# Patient Record
Sex: Female | Born: 1945 | Race: White | Hispanic: No | Marital: Married | Smoking: Never smoker
Health system: Southern US, Community
[De-identification: ages and names within clinical notes are randomized; demographics above are authoritative.]

## PROBLEM LIST (undated history)

## (undated) DIAGNOSIS — I10 Essential (primary) hypertension: Secondary | ICD-10-CM

## (undated) DIAGNOSIS — E039 Hypothyroidism, unspecified: Secondary | ICD-10-CM

## (undated) DIAGNOSIS — E785 Hyperlipidemia, unspecified: Secondary | ICD-10-CM

## (undated) DIAGNOSIS — K219 Gastro-esophageal reflux disease without esophagitis: Secondary | ICD-10-CM

## (undated) DIAGNOSIS — Z9889 Other specified postprocedural states: Secondary | ICD-10-CM

## (undated) DIAGNOSIS — I447 Left bundle-branch block, unspecified: Secondary | ICD-10-CM

## (undated) DIAGNOSIS — I951 Orthostatic hypotension: Secondary | ICD-10-CM

## (undated) DIAGNOSIS — G459 Transient cerebral ischemic attack, unspecified: Secondary | ICD-10-CM

## (undated) DIAGNOSIS — M1990 Unspecified osteoarthritis, unspecified site: Secondary | ICD-10-CM

## (undated) DIAGNOSIS — I428 Other cardiomyopathies: Secondary | ICD-10-CM

## (undated) DIAGNOSIS — Z79899 Other long term (current) drug therapy: Secondary | ICD-10-CM

## (undated) HISTORY — PX: ABDOMINAL HYSTERECTOMY: SHX81

## (undated) HISTORY — DX: Other cardiomyopathies: I42.8

## (undated) HISTORY — PX: WRIST FUSION: SHX839

## (undated) HISTORY — PX: NECK SURGERY: SHX720

## (undated) HISTORY — PX: PARTIAL HYSTERECTOMY: SHX80

## (undated) HISTORY — DX: Other long term (current) drug therapy: Z79.899

---

## 2001-01-05 ENCOUNTER — Encounter: Payer: Self-pay | Admitting: Family Medicine

## 2001-01-05 ENCOUNTER — Ambulatory Visit (HOSPITAL_COMMUNITY): Admission: RE | Admit: 2001-01-05 | Discharge: 2001-01-05 | Payer: Self-pay | Admitting: Family Medicine

## 2001-01-06 ENCOUNTER — Encounter: Payer: Self-pay | Admitting: Family Medicine

## 2001-01-06 ENCOUNTER — Ambulatory Visit (HOSPITAL_COMMUNITY): Admission: RE | Admit: 2001-01-06 | Discharge: 2001-01-06 | Payer: Self-pay | Admitting: Family Medicine

## 2001-01-11 ENCOUNTER — Ambulatory Visit (HOSPITAL_COMMUNITY): Admission: RE | Admit: 2001-01-11 | Discharge: 2001-01-11 | Payer: Self-pay | Admitting: Family Medicine

## 2001-01-11 ENCOUNTER — Encounter: Payer: Self-pay | Admitting: Family Medicine

## 2001-02-22 ENCOUNTER — Encounter: Payer: Self-pay | Admitting: Family Medicine

## 2001-02-22 ENCOUNTER — Ambulatory Visit (HOSPITAL_COMMUNITY): Admission: RE | Admit: 2001-02-22 | Discharge: 2001-02-22 | Payer: Self-pay | Admitting: Family Medicine

## 2001-04-28 ENCOUNTER — Other Ambulatory Visit: Admission: RE | Admit: 2001-04-28 | Discharge: 2001-04-28 | Payer: Self-pay | Admitting: Obstetrics and Gynecology

## 2001-04-30 ENCOUNTER — Encounter: Payer: Self-pay | Admitting: Obstetrics and Gynecology

## 2001-04-30 ENCOUNTER — Ambulatory Visit (HOSPITAL_COMMUNITY): Admission: RE | Admit: 2001-04-30 | Discharge: 2001-04-30 | Payer: Self-pay | Admitting: Obstetrics and Gynecology

## 2001-06-28 ENCOUNTER — Encounter: Payer: Self-pay | Admitting: Family Medicine

## 2001-06-28 ENCOUNTER — Ambulatory Visit (HOSPITAL_COMMUNITY): Admission: RE | Admit: 2001-06-28 | Discharge: 2001-06-28 | Payer: Self-pay | Admitting: Family Medicine

## 2002-09-05 ENCOUNTER — Encounter: Payer: Self-pay | Admitting: Family Medicine

## 2002-09-05 ENCOUNTER — Ambulatory Visit (HOSPITAL_COMMUNITY): Admission: RE | Admit: 2002-09-05 | Discharge: 2002-09-05 | Payer: Self-pay | Admitting: Family Medicine

## 2002-10-26 ENCOUNTER — Encounter: Payer: Self-pay | Admitting: Family Medicine

## 2002-10-26 ENCOUNTER — Ambulatory Visit (HOSPITAL_COMMUNITY): Admission: RE | Admit: 2002-10-26 | Discharge: 2002-10-26 | Payer: Self-pay | Admitting: Family Medicine

## 2002-10-27 ENCOUNTER — Ambulatory Visit (HOSPITAL_COMMUNITY): Admission: RE | Admit: 2002-10-27 | Discharge: 2002-10-27 | Payer: Self-pay | Admitting: Family Medicine

## 2002-10-27 ENCOUNTER — Encounter: Payer: Self-pay | Admitting: Family Medicine

## 2002-11-25 ENCOUNTER — Ambulatory Visit (HOSPITAL_COMMUNITY): Admission: RE | Admit: 2002-11-25 | Discharge: 2002-11-25 | Payer: Self-pay | Admitting: Pulmonary Disease

## 2004-08-26 ENCOUNTER — Ambulatory Visit: Payer: Self-pay | Admitting: Orthopedic Surgery

## 2004-10-21 ENCOUNTER — Ambulatory Visit: Payer: Self-pay | Admitting: Orthopedic Surgery

## 2004-10-21 ENCOUNTER — Ambulatory Visit (HOSPITAL_COMMUNITY): Admission: RE | Admit: 2004-10-21 | Discharge: 2004-10-21 | Payer: Self-pay | Admitting: Obstetrics and Gynecology

## 2004-11-04 ENCOUNTER — Ambulatory Visit: Payer: Self-pay | Admitting: Orthopedic Surgery

## 2004-11-11 ENCOUNTER — Ambulatory Visit (HOSPITAL_COMMUNITY): Admission: RE | Admit: 2004-11-11 | Discharge: 2004-11-11 | Payer: Self-pay | Admitting: Orthopedic Surgery

## 2004-11-18 ENCOUNTER — Ambulatory Visit: Payer: Self-pay | Admitting: Orthopedic Surgery

## 2005-06-01 ENCOUNTER — Emergency Department (HOSPITAL_COMMUNITY): Admission: EM | Admit: 2005-06-01 | Discharge: 2005-06-02 | Payer: Self-pay | Admitting: Emergency Medicine

## 2005-08-18 ENCOUNTER — Ambulatory Visit: Payer: Self-pay | Admitting: Orthopedic Surgery

## 2005-09-29 ENCOUNTER — Ambulatory Visit: Payer: Self-pay | Admitting: Orthopedic Surgery

## 2005-10-03 ENCOUNTER — Ambulatory Visit (HOSPITAL_COMMUNITY): Admission: RE | Admit: 2005-10-03 | Discharge: 2005-10-03 | Payer: Self-pay | Admitting: Orthopedic Surgery

## 2005-10-20 ENCOUNTER — Ambulatory Visit: Payer: Self-pay | Admitting: Orthopedic Surgery

## 2006-01-13 ENCOUNTER — Encounter (HOSPITAL_COMMUNITY): Admission: RE | Admit: 2006-01-13 | Discharge: 2006-01-13 | Payer: Self-pay | Admitting: Orthopedic Surgery

## 2006-01-15 ENCOUNTER — Ambulatory Visit: Admission: RE | Admit: 2006-01-15 | Discharge: 2006-01-15 | Payer: Self-pay | Admitting: Orthopedic Surgery

## 2006-01-22 ENCOUNTER — Encounter (HOSPITAL_COMMUNITY): Admission: RE | Admit: 2006-01-22 | Discharge: 2006-02-21 | Payer: Self-pay | Admitting: Orthopedic Surgery

## 2007-08-18 ENCOUNTER — Ambulatory Visit (HOSPITAL_COMMUNITY): Admission: RE | Admit: 2007-08-18 | Discharge: 2007-08-18 | Payer: Self-pay | Admitting: Obstetrics and Gynecology

## 2008-10-01 ENCOUNTER — Inpatient Hospital Stay (HOSPITAL_COMMUNITY): Admission: EM | Admit: 2008-10-01 | Discharge: 2008-10-03 | Payer: Self-pay | Admitting: Emergency Medicine

## 2008-10-02 ENCOUNTER — Encounter (INDEPENDENT_AMBULATORY_CARE_PROVIDER_SITE_OTHER): Payer: Self-pay | Admitting: Internal Medicine

## 2009-04-30 ENCOUNTER — Ambulatory Visit (HOSPITAL_COMMUNITY): Admission: RE | Admit: 2009-04-30 | Discharge: 2009-05-01 | Payer: Self-pay | Admitting: Neurosurgery

## 2009-05-15 ENCOUNTER — Emergency Department (HOSPITAL_COMMUNITY): Admission: EM | Admit: 2009-05-15 | Discharge: 2009-05-15 | Payer: Self-pay | Admitting: Emergency Medicine

## 2009-07-26 ENCOUNTER — Ambulatory Visit (HOSPITAL_COMMUNITY): Admission: RE | Admit: 2009-07-26 | Discharge: 2009-07-26 | Payer: Self-pay | Admitting: Cardiovascular Disease

## 2010-01-24 ENCOUNTER — Ambulatory Visit: Payer: Self-pay | Admitting: Orthopedic Surgery

## 2010-01-24 DIAGNOSIS — M19049 Primary osteoarthritis, unspecified hand: Secondary | ICD-10-CM | POA: Insufficient documentation

## 2010-01-31 ENCOUNTER — Encounter: Payer: Self-pay | Admitting: Orthopedic Surgery

## 2010-05-08 ENCOUNTER — Ambulatory Visit
Admission: RE | Admit: 2010-05-08 | Discharge: 2010-05-08 | Payer: Self-pay | Source: Home / Self Care | Attending: Cardiology | Admitting: Cardiology

## 2010-05-08 ENCOUNTER — Encounter (INDEPENDENT_AMBULATORY_CARE_PROVIDER_SITE_OTHER): Payer: Self-pay | Admitting: *Deleted

## 2010-05-08 ENCOUNTER — Encounter: Payer: Self-pay | Admitting: Physician Assistant

## 2010-05-08 DIAGNOSIS — E785 Hyperlipidemia, unspecified: Secondary | ICD-10-CM | POA: Insufficient documentation

## 2010-05-08 DIAGNOSIS — I10 Essential (primary) hypertension: Secondary | ICD-10-CM | POA: Insufficient documentation

## 2010-05-09 ENCOUNTER — Encounter: Payer: Self-pay | Admitting: Cardiology

## 2010-05-12 ENCOUNTER — Encounter: Payer: Self-pay | Admitting: Obstetrics and Gynecology

## 2010-05-23 NOTE — Progress Notes (Signed)
Summary: Progress note  Progress note   Imported By: Jacklynn Ganong 01/22/2010 10:03:49  _____________________________________________________________________  External Attachment:    Type:   Image     Comment:   External Document

## 2010-05-23 NOTE — Letter (Signed)
Summary: [facility] [place] [facility] [employer], agricultural at [employer]  [address] [place] [address]   Phone: [phone]  Fax: [phone]     May 08, 2010 MRN: [id number]   [facility] [address] [place], [place]  [address]      YOUR LAB WORK IS DUE   May 29, 2010  Please go to [facility], located across the street from [facility] on the second floor.  Hours are Monday - Friday 7am until 7:30pm         Saturday 8am until 12noon      _X_ YOUR LABWORK IS NOT FASTING --YOU MAY EAT PRIOR TO LABWORK

## 2010-05-23 NOTE — Assessment & Plan Note (Signed)
Summary: EVAL RT MIDDLE FINGER/"KNOT"/NEEDS XRAY/BCBS OUTOFST/CAF   Vital Signs:  Patient profile:   65 year old female Height:      63 inches Weight:      153 pounds Pulse rate:   64 / minute Resp:     16 per minute  Vitals Entered By: Laura Canada MD (January 24, 2010 9:04 AM)  Visit Type:  new patient Referring [name]:  self Primary [name]:  Dr. Phillips [name]  CC:  knot on finger.  History of Present Illness: I saw [name] in the office today for an initial visit.  She is a 65 years old woman with the complaint of:  knot on the right middle finger.  Xrays today.  Meds: Simvastatin, Synthroid, Fludrocortisone, Citracel, Centrum silver.  FYI 2007 was seen here for mass right wrist, was referred to [facility], had surgery.  exam the patient has a mass on the RIGHT long finger which appears to be associated with some sharp moderate pain which comes and goes.  She denies any change in size in the mass.  She gets relief from Aleve.  She denies any numbness or neurologic symptoms but has some pain when she bends her finger      Allergies (verified): 1)  ! Codeine  Past History:  Past Medical History: cholesterol thyroid  Past Surgical History: rt wrist [name], mass c spine surgery Dr. Newell [name]. catheterization  Family History: FH of Cancer:  Family History of Diabetes  Social History: Patient is married.  unemployed no smoking no alcohol 1 cup of coffee per day. GED  Review of Systems Constitutional:  Complains of fatigue; denies weight loss, weight gain, fever, and chills. Cardiovascular:  Denies chest pain, palpitations, fainting, and murmurs. Respiratory:  Complains of snoring; denies short of breath, wheezing, couch, tightness, pain on inspiration, and snoring . Gastrointestinal:  Denies heartburn, nausea, vomiting, diarrhea, constipation, and blood in your stools. Genitourinary:  Denies frequency, urgency, difficulty urinating, painful urination,  flank pain, and bleeding in urine. Neurologic:  Denies numbness, tingling, unsteady gait, dizziness, tremors, and seizure. Musculoskeletal:  Complains of muscle pain; denies joint pain, swelling, instability, stiffness, redness, and heat. Endocrine:  Complains of excessive thirst; denies exessive urination and heat or cold intolerance. Psychiatric:  Denies nervousness, depression, anxiety, and hallucinations. Skin:  Complains of poor healing; denies changes in the skin, rash, itching, and redness. HEENT:  Denies blurred or double vision, eye pain, redness, and watering. Immunology:  Denies seasonal allergies, sinus problems, and allergic to bee stings. Hemoatologic:  Complains of brusing; denies easy bleeding.  Physical Exam  Additional Exam:  GEN: well developed, well nourished, normal grooming and hygiene, no deformity and normal body habitus.   CDV: RIGHT upper extremity: radial and ulnar pulses are normal, no edema, no erythema. no tenderness  Lymph:RIGHT upper extremity.  normal lymph nodes   Skin: no rashes, skin lesions or open sores   NEURO: normal coordination, reflexes, sensation.   Psyche: awake, alert and oriented. Mood normal   RIGHT long finger has a nodule over the PIP joint which is consistent with an arthritic nodule and possible mucous cyst.  It is nontender.  The range of motion in the joint is normal.  She has excellent extension power and flexion power and the joint is stable   Impression & Recommendations:  Problem # 1:  OSTEOARTHROSIS UNSPEC WHETHER GEN/LOCALIZED HAND (ICD-715.94) Assessment New  x-rays of the hand show degenerative changes about the PIP joint otherwise remaining portions of the hand  with normal impression normal and  Orders: New Patient Level III ([address]) Hand x-ray, minimum 3 views ([address])  Patient Instructions: 1)  Aspercremes three times a day to hand as needed

## 2010-05-23 NOTE — Assessment & Plan Note (Signed)
Summary: ***NP6 HYPERTENTION   Visit Type:  Initial Consult Referring Provider:  self Primary Provider:  Dr. Phillips Odor  CC:  hypertention per Dr.Golding.  History of Present Illness: This is a 65 year old white female patient who was referred to [place] for control of her hypertension. She has had 2 cardiac catheterizations in the past which showed normal coronary arteries and normal LV function. This was last performed by [facility] in April of 2011 after an abnormal Myoview. The patient has been treated with losartan hydrochlorothiazide 100 mg/12.5 mg daily. She recently had dizziness and low blood pressures with a systolic of 90. Her medications was stopped last Friday her blood pressures began climbing up to 158/80 168/82 and today 149/69. 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 She was walking about 2 miles daily but backed off on her blood pressure became a problem.  Patient denies chest pain, palpitations, dyspnea, dyspnea on exertion, dizziness or presyncope.  Current Medications (verified): 1)  Synthroid 75 Mcg Tabs (Levothyroxine Sodium) .... Take 1 Tab Daily 2)  Vivelle-Dot 0.05 Mg/24hr Pttw (Estradiol) .... Apply 1 Patch Twice Weekly 3)  Aleve 220 Mg Tabs (Naproxen Sodium) .... Take As Directed 4)  Caltrate 600+d Plus 600-400 Mg-Unit Tabs (Calcium Carbonate-Vit D-Min) .... Take 1 Tab Daily 5)  Simvastatin 20 Mg Tabs (Simvastatin) .... Take 1 Tab Daily 6)  Synthroid 75 Mcg Tabs (Levothyroxine Sodium) .... Take 1 Tab Daily 7)  Hydrochlorothiazide 25 Mg Tabs (Hydrochlorothiazide) .... Take One Tablet By Mouth Daily.  Allergies (verified): 1)  ! Codeine  Comments:  Nurse/Medical Assistant: patient is a [facility] reason she is coming here  is she told Dr.Golding she didnt want to go back there. [facility]  Past History:  Past Medical  History: Last updated: 01/24/2010 cholesterol thyroid  Past Surgical History: Last updated: 01/24/2010 rt wrist Gramig, mass c spine surgery Dr. Newell Coral. catheterization  Family History: Last updated: 01/24/2010 FH of Cancer:  Family History of Diabetes  Social History: Last updated: 01/24/2010 Patient is married.  unemployed no smoking no alcohol 1 cup of coffee per day. GED  Review of Systems       see history of present illness, otherwise negative  Vital Signs:  Patient profile:   65 year old female Weight:      154 pounds BMI:     27.38 Pulse rate:   76 / minute BP sitting:   146 / 69  (left arm)  Vitals Entered By: Dreama Saa, CNA (May 08, 2010 10:47 AM)  Physical Exam  General:   Well-nournished, in no acute distress. Neck: No JVD, HJR, Bruit, or thyroid enlargement Lungs: No tachypnea, clear without wheezing, rales, or rhonchi Cardiovascular: RRR, PMI not displaced, heart sounds normal, no murmurs, gallops, bruit, thrill, or heave. Abdomen: BS normal. Soft without organomegaly, masses, lesions or tenderness. Extremities: mild ankle edema, without cyanosis, clubbing. Good distal pulses bilateral SKin: Warm, no lesions or rashes  Musculoskeletal: No deformities Neuro: no focal signs    EKG  Procedure date:  05/08/2010  Findings:      normal sinus rhythm, poor R wave progression consistent with an anteroseptal infarct and nonspecific ST-T wave changes inferolateral no acute change  Impression & Recommendations:  Problem # 1:  HYPERTENSION, BENIGN (ICD-401.1)  Patient's blood pressure is slowly climbing since her medications have been stopped. She does like to have  a diuretic because of ankle swelling. We will restart hydrochlorothiazide 25 mg once daily. She will continue to keep track of her blood pressure the home and call if it becomes too low or continues to be elevated. I will see her back in one month. We will also give her 2 g sodium  diet to follow.We will check the BMET in 3 weeks  Her updated medication list for this problem includes:    Hydrochlorothiazide 25 Mg Tabs (Hydrochlorothiazide) .Marland Kitchen... Take one tablet by mouth daily.  Problem # 2:  HYPERLIPIDEMIA-MIXED (ICD-272.4)  Patient's cholesterol was checked 2 weeks ago and is managed by her primary care physician Her updated medication list for this problem includes:    Simvastatin 20 Mg Tabs (Simvastatin) .Marland Kitchen... Take 1 tab daily  Her updated medication list for this problem includes:    Simvastatin 20 Mg Tabs (Simvastatin) .Marland Kitchen... Take 1 tab daily  Other Orders: Future Orders: T-Basic Metabolic Panel [phone]) ... 05/29/2010  Patient Instructions: 1)  Your physician recommends that you schedule a follow-up appointment in: 1 MONTH 2)  Your physician recommends that you return for lab work in: 3 WEEKS 3)  Your physician has recommended you make the following change in your medication: HYDROCHLORATHIAZIDE 25 MG DAILY Prescriptions: HYDROCHLOROTHIAZIDE 25 MG TABS (HYDROCHLOROTHIAZIDE) Take one tablet by mouth daily.  #30 x 3   Entered by:   Teressa Lower RN   Authorized by:   Marletta Lor, PA-C   Signed by:   Teressa Lower RN on 05/08/2010   Method used:   Electronically to        [facility] (retail)       [address]       [place], [place]  [address]       Ph: [phone]       Fax: [phone]   RxID:   [id number]   Appended Document: ***NP6 HYPERTENTION Patient seen and examined with Ms. Geni Bers. History was reviewed, including cardiac evaluation to date and also recent medication adjustments. Blood pressure trend is now increasing off antihypertensives. Patient states that her weight has increased over the last few years, and she has been less active in terms of her walking regimen. We discussed sodium restriction, weight loss with more regular exercise, and plan to resume HCTZ at 25 mg daily alone at  this point. Follow blood pressure trend from there and modified as necessary. Followup BMET for return visit in one month.

## 2010-05-23 NOTE — Letter (Signed)
Summary: [facility]   Imported By: Faythe Ghee 05/09/2010 11:04:18  _____________________________________________________________________  External Attachment:    Type:   Image     Comment:   External Document

## 2010-05-23 NOTE — Letter (Signed)
Summary: History form  History form   Imported By: Jacklynn Ganong 01/31/2010 15:47:08  _____________________________________________________________________  External Attachment:    Type:   Image     Comment:   External Document

## 2010-05-23 NOTE — Progress Notes (Signed)
Summary: initial evaluation  initial evaluation   Imported By: Jacklynn Ganong 01/22/2010 10:03:03  _____________________________________________________________________  External Attachment:    Type:   Image     Comment:   External Document

## 2010-05-27 ENCOUNTER — Other Ambulatory Visit: Payer: Self-pay | Admitting: Neurosurgery

## 2010-05-27 DIAGNOSIS — M542 Cervicalgia: Secondary | ICD-10-CM

## 2010-05-29 ENCOUNTER — Encounter: Payer: Self-pay | Admitting: Physician Assistant

## 2010-05-29 LAB — CONVERTED CEMR LAB: Sodium: 137 meq/L (ref 135–145)

## 2010-06-05 ENCOUNTER — Ambulatory Visit
Admission: RE | Admit: 2010-06-05 | Discharge: 2010-06-05 | Disposition: A | Discharge status: Home / Self Care | Payer: BC Managed Care – PPO | Source: Ambulatory Visit | Attending: Neurosurgery | Admitting: Neurosurgery

## 2010-06-05 DIAGNOSIS — M542 Cervicalgia: Secondary | ICD-10-CM

## 2010-06-17 ENCOUNTER — Other Ambulatory Visit: Payer: Self-pay | Admitting: Obstetrics and Gynecology

## 2010-06-17 DIAGNOSIS — Z139 Encounter for screening, unspecified: Secondary | ICD-10-CM

## 2010-06-19 ENCOUNTER — Ambulatory Visit: Payer: Self-pay | Admitting: Physician Assistant

## 2010-07-03 ENCOUNTER — Encounter: Payer: Self-pay | Admitting: Physician Assistant

## 2010-07-03 ENCOUNTER — Ambulatory Visit (INDEPENDENT_AMBULATORY_CARE_PROVIDER_SITE_OTHER): Payer: BC Managed Care – PPO | Admitting: Physician Assistant

## 2010-07-03 DIAGNOSIS — I10 Essential (primary) hypertension: Secondary | ICD-10-CM

## 2010-07-03 DIAGNOSIS — E785 Hyperlipidemia, unspecified: Secondary | ICD-10-CM

## 2010-07-03 DIAGNOSIS — E782 Mixed hyperlipidemia: Secondary | ICD-10-CM

## 2010-07-03 NOTE — Assessment & Plan Note (Signed)
Patient's blood pressure is well controlled no change.

## 2010-07-03 NOTE — Assessment & Plan Note (Signed)
stable °

## 2010-07-03 NOTE — Progress Notes (Deleted)
Subjective:      Patient ID: [name] is a 65 y.o. female.  Chief Complaint: HPI {Common ambulatory SmartLinks:19316} ROS    Objective:    Physical Exam  Lab Review:  {Recent labs:19471::"not applicable"}    Assessment:     No diagnosis found.   Plan:     ***

## 2010-07-03 NOTE — Progress Notes (Signed)
HPI  Allergies  Allergen Reactions  . Codeine     No current outpatient prescriptions on file prior to visit.    No past medical history on file.  No past surgical history on file.  No family history on file.  History   Social History  . Marital Status: Married    Spouse Name: N/A    Number of Children: N/A  . Years of Education: N/A   Occupational History  . Not on file.   Social History Main Topics  . Smoking status: Not on file  . Smokeless tobacco: Not on file  . Alcohol Use: Not on file  . Drug Use: Not on file  . Sexually Active: Not on file   Other Topics Concern  . Not on file   Social History Narrative  . No narrative on file    ROS The patient denies anorexia, fever, weight loss,, vision loss, decreased hearing, hoarseness, chest pain, syncope, dyspnea on exertion, peripheral edema, balance deficits, hemoptysis, abdominal pain, melena, hematochezia, severe indigestion/heartburn, hematuria, incontinence, genital sores, muscle weakness, suspicious skin lesions, transient blindness, difficulty walking, depression, unusual weight change, abnormal bleeding, enlarged lymph nodes, angioedema, and breast masses.   PHYSICAL EXAM Well-nournished, in no acute distress. Neck: No JVD, HJR, Bruit, or thyroid enlargement Lungs: No tachypnea, clear without wheezing, rales, or rhonchi Cardiovascular: RRR, PMI not displaced, heart sounds normal, no murmurs, gallops, bruit, thrill, or heave. Abdomen: BS normal. Soft without organomegaly, masses, lesions or tenderness. Extremities: without cyanosis, clubbing or edema. Good distal pulses bilateral SKin: Warm, no lesions or rashes  Musculoskeletal: No deformities Neuro: no focal signs  There were no vitals taken for this visit.  EKG:  ASSESSMENT AND PLAN:

## 2010-07-04 ENCOUNTER — Ambulatory Visit (HOSPITAL_COMMUNITY)
Admission: RE | Admit: 2010-07-04 | Discharge: 2010-07-04 | Disposition: A | Discharge status: Home / Self Care | Payer: BC Managed Care – PPO | Source: Ambulatory Visit | Attending: Obstetrics and Gynecology | Admitting: Obstetrics and Gynecology

## 2010-07-04 DIAGNOSIS — Z139 Encounter for screening, unspecified: Secondary | ICD-10-CM

## 2010-07-04 DIAGNOSIS — Z1231 Encounter for screening mammogram for malignant neoplasm of breast: Secondary | ICD-10-CM | POA: Insufficient documentation

## 2010-07-07 LAB — DIFFERENTIAL
Eosinophils Absolute: 0.1 10*3/uL (ref 0.0–0.7)
Eosinophils Relative: 1 % (ref 0–5)
Lymphocytes Relative: 37 % (ref 12–46)
Monocytes Relative: 12 % (ref 3–12)
Neutro Abs: 2.1 10*3/uL (ref 1.7–7.7)
Neutrophils Relative %: 49 % (ref 43–77)

## 2010-07-07 LAB — URINE MICROSCOPIC-ADD ON

## 2010-07-07 LAB — CBC
HCT: 37.3 % (ref 36.0–46.0)
Hemoglobin: 13.2 g/dL (ref 12.0–15.0)
MCHC: 35.3 g/dL (ref 30.0–36.0)
MCV: 91.6 fL (ref 78.0–100.0)
Platelets: 186 10*3/uL (ref 150–400)
RBC: 4.07 MIL/uL (ref 3.87–5.11)
RBC: 3.99 MIL/uL (ref 3.87–5.11)
RDW: 12.8 % (ref 11.5–15.5)
WBC: 5.7 10*3/uL (ref 4.0–10.5)

## 2010-07-07 LAB — BASIC METABOLIC PANEL
BUN: 14 mg/dL (ref 6–23)
CO2: 25 mEq/L (ref 19–32)
CO2: 25 mEq/L (ref 19–32)
Calcium: 9.6 mg/dL (ref 8.4–10.5)
Chloride: 102 mEq/L (ref 96–112)
Chloride: 105 mEq/L (ref 96–112)
Creatinine, Ser: 0.89 mg/dL (ref 0.4–1.2)
GFR calc Af Amer: >60 mL/min (ref >60)
GFR calc Af Amer: >60 mL/min (ref >60)
GFR calc non Af Amer: >60 mL/min (ref >60)
Glucose, Bld: 95 mg/dL (ref 70–99)
Glucose, Bld: 122 mg/dL — ABNORMAL HIGH (ref 70–99)
Potassium: 4.2 mEq/L (ref 3.5–5.1)
Sodium: 137 mEq/L (ref 135–145)

## 2010-07-07 LAB — URINALYSIS, ROUTINE W REFLEX MICROSCOPIC
Ketones, ur: NEGATIVE mg/dL
Specific Gravity, Urine: 1.01 (ref 1.005–1.030)
pH: 8.5 — ABNORMAL HIGH (ref 5.0–8.0)

## 2010-07-09 NOTE — Assessment & Plan Note (Signed)
Summary: 1 MTH F/U PER CHECKOUT ON 05/08/10 TG/LA   Visit Type:  Follow-up Referring Provider:  self Primary Provider:  Dr. Phillips Odor  CC:  1 mth fu.  History of Present Illness: This is a 65 year old white female patient who is here today for followup of hypertension. She was started on hydrochlorothiazide her last office visit for trending hypertension. She had previously been treated with losartan hydrochlorothiazide and became hypotensive.  The patient states that her blood pressures have been stable at home and she has not had much dizziness. If she gets up quickly she'll occasionally become dizzy and when she checked her blood pressure though it has been is 97/50. She's been under quite a bit of stress because her husband had another stroke last week.  Patient is also limiting her sodium intake which has helped her swelling.  Current Medications (verified): 1)  Synthroid 75 Mcg Tabs (Levothyroxine Sodium) .... Take 1 Tab Daily 2)  Vivelle-Dot 0.05 Mg/24hr Pttw (Estradiol) .... Apply 1 Patch Twice Weekly 3)  Aleve 220 Mg Tabs (Naproxen Sodium) .... Take As Directed 4)  Caltrate 600+d Plus 600-400 Mg-Unit Tabs (Calcium Carbonate-Vit D-Min) .... Take 1 Tab Daily 5)  Simvastatin 10 Mg Tabs (Simvastatin) .... Take 1 Tab Daily 6)  Synthroid 75 Mcg Tabs (Levothyroxine Sodium) .... Take 1 Tab Daily 7)  Hydrochlorothiazide 25 Mg Tabs (Hydrochlorothiazide) .... Take One Tablet By Mouth Daily.  Allergies (verified): 1)  ! 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 Neck: No JVD, HJR, Bruit, or thyroid enlargement Lungs: No tachypnea, clear without wheezing, rales, or rhonchi Cardiovascular: RRR, PMI not displaced, heart sounds normal, no murmurs, gallops, bruit, thrill, or heave. Abdomen: BS normal. Soft without organomegaly, masses, lesions or tenderness. Extremities: without cyanosis, clubbing or edema. Good distal pulses bilateral SKin: Warm, no lesions or rashes  Musculoskeletal: No deformities Neuro: no focal signs    Impression & Recommendations:  Problem # 1:  HYPERTENSION, BENIGN (ICD-401.1) Patient's blood pressure is well controlled on hydrochlorothiazide alone. We will continue this. She can followup with her primary medical doctor and followup with cardiology as needed. BMET from May 29, 2010 was normal. Her updated medication list for this problem includes:    Hydrochlorothiazide 25 Mg Tabs (Hydrochlorothiazide) .Marland Kitchen... Take one tablet by mouth daily.  Problem # 2:  HYPERLIPIDEMIA-MIXED (ICD-272.4) treated. Her updated medication list for this problem includes:    Simvastatin 10 Mg Tabs (Simvastatin) .Marland Kitchen... Take 1 tab daily  Patient Instructions: 1)  Your physician recommends that you schedule a follow-up appointment in: as needed  Appended Document: 1 MTH F/U PER CHECKOUT ON 05/08/10 TG/LA  Reviewed Juanito Doom, MD

## 2010-07-29 LAB — VITAMIN B12: Vitamin B-12: 959 pg/mL — ABNORMAL HIGH (ref 211–911)

## 2010-07-29 LAB — BASIC METABOLIC PANEL
BUN: 10 mg/dL (ref 6–23)
BUN: 13 mg/dL (ref 6–23)
CO2: 29 mEq/L (ref 19–32)
Calcium: 9 mg/dL (ref 8.4–10.5)
Chloride: 109 mEq/L (ref 96–112)
Creatinine, Ser: 0.77 mg/dL (ref 0.4–1.2)
GFR calc non Af Amer: >60 mL/min (ref >60)
Glucose, Bld: 92 mg/dL (ref 70–99)
Potassium: 3.6 mEq/L (ref 3.5–5.1)
Potassium: 4.5 mEq/L (ref 3.5–5.1)
Sodium: 139 mEq/L (ref 135–145)

## 2010-07-29 LAB — POCT CARDIAC MARKERS
CKMB, poc: <1 ng/mL — ABNORMAL LOW (ref 1.0–8.0)
Myoglobin, poc: 49.4 ng/mL (ref 12–200)

## 2010-07-29 LAB — PROTIME-INR
INR: 1 (ref 0.00–1.49)
Prothrombin Time: 13 seconds (ref 11.6–15.2)

## 2010-07-29 LAB — HOMOCYSTEINE: Homocysteine: 9.5 umol/L (ref 4.0–15.4)

## 2010-07-29 LAB — FOLATE RBC: RBC Folate: 1255 ng/mL — ABNORMAL HIGH (ref 180–600)

## 2010-07-29 LAB — LIPID PANEL
LDL Cholesterol: 135 mg/dL — ABNORMAL HIGH (ref 0–99)
Total CHOL/HDL Ratio: 4.2 RATIO
VLDL: 27 mg/dL (ref 0–40)

## 2010-07-29 LAB — HEPATIC FUNCTION PANEL
ALT: 16 U/L (ref 0–35)
Alkaline Phosphatase: 63 U/L (ref 39–117)
Indirect Bilirubin: 0.6 mg/dL (ref 0.3–0.9)
Total Bilirubin: 0.7 mg/dL (ref 0.3–1.2)

## 2010-07-29 LAB — DIFFERENTIAL
Eosinophils Relative: 2 % (ref 0–5)
Lymphocytes Relative: 41 % (ref 12–46)
Lymphs Abs: 1.8 10*3/uL (ref 0.7–4.0)
Neutro Abs: 2.1 10*3/uL (ref 1.7–7.7)

## 2010-07-29 LAB — CBC
HCT: 36.3 % (ref 36.0–46.0)
Platelets: 169 10*3/uL (ref 150–400)
WBC: 4.5 10*3/uL (ref 4.0–10.5)

## 2010-07-29 LAB — CARDIAC PANEL(CRET KIN+CKTOT+MB+TROPI): Troponin I: 0.01 ng/mL (ref 0.00–0.06)

## 2010-07-29 LAB — TSH: TSH: 3.635 u[IU]/mL (ref 0.350–4.500)

## 2010-09-03 NOTE — H&P (Signed)
NAMEMINETTE, MANDERS               ACCOUNT NO.:  [id number]   MEDICAL RECORD NO.:  [id number]          PATIENT TYPE:  INP   LOCATION:  A312                          FACILITY:  [facility]   PHYSICIAN:  Ruthy Dick, MD    DATE OF BIRTH:  17-Oct-1945   DATE OF ADMISSION:  10/01/2008  DATE OF DISCHARGE:  LH                              HISTORY & PHYSICAL   The patient was seen and examined in the emergency room.   CHIEF COMPLAINT:  Left-sided numbness and weakness, 4 episodes today.   PRIMARY CARE PHYSICIAN:  Patrica Duel, MD   HISTORY OF PRESENT ILLNESS:  [name] is 65 year old pleasant  Caucasian lady with a past medical history significant for  hypothyroidism, who came into the hospital with complaints of having had  4 episodes of weakness on the left side earlier today.  At the time I  saw this patient she had no weakness or numbness at all.  In any case,  she mentions that earlier this morning she had woken up about 8:00 a.m.  and while she was going through her daily activity, she noticed that she  was unable to lift the left leg and left arm and there was some tingling  and numbness as well during this time.  This lasted about 1 minute and  passed away.  Later on in the course of the day, she had 3 more episodes  of the same situation lasting about 1 minute and because of this, she  decided to come to the hospital.  She did not have any speech difficulty  along with this and do not have any visual changes as well.  At the time  I saw her, she had no more of this complaints, but she needed to be  admitted for further evaluation.   PAST MEDICAL HISTORY:  Hypothyroidism.   SOCIAL HISTORY:  Lives with husband.  Denies alcohol, tobacco, and  illicit drug use.   MEDICATIONS:  1. Aspirin 81 mg daily and she takes this every day according to her.  2. Synthroid 50 mcg daily.  3. Omeprazole 20 mg daily.  4. Estropipate 1.2 mg daily.  5. Aleve as needed.   ALLERGIES:   CODEINE which actually only causes nausea and vomiting and I  mentioned to her that this is not really an allergy, but a side effect.   FAMILY HISTORY:  Significant for mother has diabetes and hypertension  and also in her mother's family somebody has had heart problems,  otherwise, noncontributory.   REVIEW OF SYSTEMS:  A 12-point review of systems was done and was  negative except as noted in the history of present illness.  Additionally, the patient denies syncope, palpitation.  Denies diplopia.  The patient also denies dysuria, frequency, urgency.  Denies skin rash.  Denies nausea, vomiting, diarrhea, and constipation.  Denies chest pain  and shortness of breath.   PHYSICAL EXAMINATION:  GENERAL:  Seen and examined in the emergency  room, in no acute cardiopulmonary distress and in no painful distress  either.  VITAL SIGNS:  Temperature 97.4, pulse 79, respirations  20, blood  pressure 160/74, and saturating 99% on room air.  HEENT:  Normocephalic and atraumatic.  Pupils equal, round, and reactive  to light.  Extraocular muscles intact.  Nares patent.  NECK:  Supple.  No JVD.  No lymphadenopathy.  No thyromegaly.  CHEST:  Clear to auscultation bilaterally.  No rhonchi, no rales, and no  wheezing.  ABDOMEN:  Soft and nontender.  No hepatosplenomegaly.  EXTREMITIES:  No clubbing, no cyanosis, no edema.  CARDIOVASCULAR:  First and second heart sounds only, no murmurs  CENTRAL NERVOUS SYSTEM:  Nonfocal.  Cranial nerves seems to be intact II  through XII.  The patient has normal reflexes in all extremities and  power is 5/5 globally in all extremities.  No numbness and no sensory  deficits noted.   LABS AND INVESTIGATIONS:  Chest x-ray is read as having subsegmental  atelectasis versus scarring in the lingula, probably COPD.  The patient  has no history of smoking but says she has been around smokers.  CT scan  of the head is read as normal CT scan of the head without contrast.   CBC  with differential is within normal limits, also BNP is within normal  limits.  Troponins less than 0.05.   ASSESSMENT:  1. Transient ischemic attack.  2. Hypothyroidism.   PLAN OF CARE:  The patient will be admitted for workup for TIA.  This  feels and sounds like TIA and because of this we are planning to have  her on Neuro checks and get a 2-D echo, carotid Doppler, and MRI of the  brain tomorrow.  We believe that since the patient was already on  aspirin now we may just be able to get away with changing her medication  to a higher dose of aspirin for primary stroke prevention.  Highly,  there is evidence of stroke on her MRI, then we will have to place the  patient on something like Aggrenox versus Plavix.  Personally, I will  favor Aggrenox in this scenario since she has no heart condition.  DVT  prophylaxis will be provided. Time used: 1hr.      Ruthy Dick, MD  Electronically Signed     GU/MEDQ  D:  10/01/2008  T:  10/02/2008  Job:  [id number]

## 2010-09-03 NOTE — Discharge Summary (Signed)
[name], [name]               ACCOUNT NO.:  [id number]   MEDICAL RECORD NO.:  [id number]          PATIENT TYPE:  INP   LOCATION:  A312                          FACILITY:  [facility]   PHYSICIAN:  Osvaldo Shipper, MD     DATE OF BIRTH:  [birth year]   DATE OF ADMISSION:  10/01/2008  DATE OF DISCHARGE:  06/15/2010LH                               DISCHARGE SUMMARY   The patient's PMD is Dr. Patrica Duel.   DISCHARGE DIAGNOSES:  1. Possible transient ischemic attack.  2. Cervical disk disease requiring outpatient followup.  3. Hypertension with mild left ventricular hypertrophy.  4. Dyslipidemia.  5. History of hypothyroidism.   Please review H and P dictated by Dr. Abram Sander for details regarding the  patient's presenting illness.   BRIEF HOSPITAL COURSE:  1. Possible TIA.  This is a 65 year old Caucasian female who presented      to the hospital with complaints of left arm and left leg numbness      and weakness, which was transient.  The symptoms went away after a      few minutes.  She was evaluated in the ED and underwent a CT head,      which was negative for acute process.  She had MRI of the brain,      which did not show any acute intracranial process, mild to moderate      white matter disease was noted.  Carotid Dopplers did not show any      significant occlusions.  The patient also underwent an      echocardiogram, which showed a normal EF without any valvular      abnormalities.  No wall motion abnormalities, mild concentric      hypertrophy was noted.  It is felt that this could have been a TIA.      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  This      finding was discussed with Dr. Newell Coral, neurosurgeon at [facility] and      he felt that this will not cause all of her symptoms.  He      recommends outpatient followup for the  same.  So, for the TIA, we      will set her up with Dr. Gerilyn Pilgrim in a few weeks.  We will change      her aspirin to Plavix.  Since her LDL is elevated at 135, we will      put her on simvastatin.  TSH was normal.  B12 was 959.  Cardiac      enzymes were negative.  2. Abnormal EKG.  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      Echocardiogram does not show any valvular abnormalities.  Upon      questioning the patient further, she tells me that she had a  negative stress test and a negative cardiac cath about a year ago      done by Dr. Allyson Sabal with [facility].  In view of this,      she does not need any immediate followup, but I have told her to      follow up with Dr. Allyson Sabal in the next few weeks.  3. Mild concentric hypertrophy with elevated blood pressures.  She      will be put on lisinopril for the same.  4. Rest of her medical issues remain stable.  She also has      hypothyroidism.  She is a very anxious lady.  She is very keen on      getting out of here today.  She has been ambulating without      difficulty.  She does not need any physical therapy at this time.      So, basically this is a lady who may have experienced TIA, who is      back to her functional baseline.  Otherwise, she does not have any      new complaint.   PHYSICAL EXAMINATION:  VITAL SIGNS:  This morning, vital signs is  stable.  LUNGS:  Clear.  CARDIAC:  Unremarkable.  ABDOMEN:  Soft.  NEUROLOGIC:  No neurological deficits were noted.   BMET is normal today.   DISCHARGE MEDICATIONS:  1. Discontinue aspirin, start with Plavix 75 mg daily.  2. Continue with Synthroid 50 mcg daily.  3. Omeprazole 20 mg daily.  4. Estropipate 1.25 mg daily.  5. Aleve 1 tablet every 8 hours as needed for pain.   Other new medications apart from the Plavix are as follows:  1. Lisinopril 10 mg daily.  2. Simvastatin 20 mg every  evening.   LFTs have not been checked during this admission.  I am hoping the labs  would be able to add LFTs from this morning.  However, I recommend the  PMD to follow up.   Follow up with Dr. Nobie Putnam early next week, with Dr. Gerilyn Pilgrim in 3  weeks, appointment has been made for October 26, 2008 at 10 a.m., with Dr.  Newell Coral in the next 1-2 months, with Dr. Wynetta Fines, the  patient to make this appointment herself.   RECOMMENDATIONS:  To PMD to follow up on LFTs considering initiation of  cholesterol medications.   Consultations over the phone with Dr. Newell Coral.   Imaging studies have all been discussed above except for a chest x-ray,  which do not show any acute abnormalities.  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   Total time of this encounter 35 minutes.      Osvaldo Shipper, MD  Electronically Signed     GK/MEDQ  D:  10/03/2008  T:  10/04/2008  Job:  [id number]   cc:   Patrica Duel, M.D.  Fax: [phone]   Kofi A. Gerilyn Pilgrim, M.D.  Fax: [phone]   Hewitt Shorts, M.D.  Fax: [phone]   Nanetta Batty, M.D.  Fax: [phone]

## 2010-09-06 NOTE — H&P (Signed)
   NAME:  [name], [name]                         ACCOUNT NO.:  [id number]   MEDICAL RECORD NO.:  [id number]                   PATIENT TYPE:   LOCATION:                                       FACILITY:   PHYSICIAN:  Edward L. Juanetta Gosling, M.D.             DATE OF BIRTH:  Jul 27, 1945   DATE OF ADMISSION:  DATE OF DISCHARGE:                                HISTORY & PHYSICAL   REASON FOR ADMISSION:  For bronchoscopy.   HISTORY:  [name] is a 65 year old who has a history of recurrent middle  lobe pneumonia.  She is undergoing bronchoscopy to rule out an endobronchial  lesion that might be causing these problems.  She has had some cough and  sputum production and has episodic problems with shortness of breath, cough,  sputum production, and wheezing.  She has otherwise been pretty healthy.   MEDICATIONS:  1. Ogen 1.25 mg daily.  2. Synthroid 60 mcg daily.  3. Quinamm 250 mg daily.   PAST MEDICAL HISTORY:  1. Hypothyroidism.  2. Leg cramps.  3. Recurrent pneumonias.   FAMILY HISTORY:  Her father died at 36 of suicide, mother at 71 of lung  cancer, a brother of a cancer that probably was renal cell.   SOCIAL HISTORY:  She does not smoke.  She has had some second-hand exposure.   REVIEW OF SYSTEMS:  Other than as mentioned is essentially negative.   PHYSICAL EXAMINATION:  GENERAL:  Well-developed, well-nourished female who  does not appear to be in any acute distress at this time.  VITAL SIGNS:  Blood pressure 138/80, pulse 72.  Weight 136-1/2.  HEENT:  Essentially unremarkable.  Nose and throat are clear.  NECK:  Supple.  CHEST:  Clear.  Without wheezes, rales, or rhonchi.  HEART:  Regular.  Without gallop.  ABDOMEN:  Soft.  EXTREMITIES:  No edema and no clubbing or cyanosis.    ASSESSMENT:  She has had chronic pneumonias.   PLAN:  To undergo bronchoscopy.  This is scheduled for November 25, 2002, at  7:30.                                               Edward L. Juanetta Gosling,  M.D.    ELH/MEDQ  D:  11/21/2002  T:  11/22/2002  Job:  [id number]   cc:   [name], M.D.  [address]  [place]  [place] [address]  Fax: [phone]

## 2010-09-06 NOTE — Op Note (Signed)
   NAME:  [name], [name]                         ACCOUNT NO.:  [id number]   MEDICAL RECORD NO.:  [id number]                   PATIENT TYPE:  AMB   LOCATION:  DAY                                  FACILITY:  [facility]   PHYSICIAN:  Edward L. Juanetta Gosling, M.D.             DATE OF BIRTH:  1945-07-26   DATE OF PROCEDURE:  11/25/2002  DATE OF DISCHARGE:                                 OPERATIVE REPORT   PROCEDURE:  Bronchoscopy.   INDICATION FOR PROCEDURE:  Ms. Zahradnik is undergoing bronchoscopy because of  recurrent pneumonia.  She has had multiple pneumonias in the area of the  right middle lobe.  There were no contraindications for the planned  procedure.   PREOPERATIVE DIAGNOSIS:  Recurrent pneumonia.   POSTOPERATIVE DIAGNOSIS:  Recurrent pneumonia.   SURGEON:  Edward L. Juanetta Gosling, M.D.   BODY OF REPORT:  After satisfactory local anesthesia and 5 mg of Versed  intravenously, the bronchoscope was introduced through the right naris.  The  vocal cords were localized, identified, and anesthetized with 2% Xylocaine  and the bronchoscope was introduced into the trachea.  There were changes  suggestive of chronic bronchitis with bronchial pitting on both sides, and  there were a good bit of secretions of both sides including the left lung,  which had not been involved with the pneumonias in the past.  Right middle  lobe bronchi were identified and were found to be widely patent.  Brushings  of both subsegmental bronchi were made and washings were made.  The  bronchoscope was withdrawn.  The patient tolerated the procedure in good  condition and was taken to the recovery room.                                               Edward L. Juanetta Gosling, M.D.    ELH/MEDQ  D:  11/25/2002  T:  11/25/2002  Job:  [id number]   cc:   Patrica Duel, M.D.  [address], [address]  [place]  [place] [address]  Fax: [phone]

## 2010-11-19 ENCOUNTER — Other Ambulatory Visit: Payer: Self-pay | Admitting: Neurosurgery

## 2010-11-19 DIAGNOSIS — M5020 Other cervical disc displacement, unspecified cervical region: Secondary | ICD-10-CM

## 2010-11-19 DIAGNOSIS — M479 Spondylosis, unspecified: Secondary | ICD-10-CM

## 2010-11-19 DIAGNOSIS — M542 Cervicalgia: Secondary | ICD-10-CM

## 2010-11-19 DIAGNOSIS — IMO0002 Reserved for concepts with insufficient information to code with codable children: Secondary | ICD-10-CM

## 2010-12-03 ENCOUNTER — Ambulatory Visit
Admission: RE | Admit: 2010-12-03 | Discharge: 2010-12-03 | Disposition: A | Discharge status: Home / Self Care | Payer: BC Managed Care – PPO | Source: Ambulatory Visit | Attending: Neurosurgery | Admitting: Neurosurgery

## 2010-12-03 DIAGNOSIS — M542 Cervicalgia: Secondary | ICD-10-CM

## 2010-12-03 DIAGNOSIS — IMO0002 Reserved for concepts with insufficient information to code with codable children: Secondary | ICD-10-CM

## 2010-12-03 DIAGNOSIS — M479 Spondylosis, unspecified: Secondary | ICD-10-CM

## 2010-12-03 DIAGNOSIS — M5020 Other cervical disc displacement, unspecified cervical region: Secondary | ICD-10-CM

## 2010-12-24 MED ORDER — SODIUM CHLORIDE 0.45 % IV SOLN
Freq: Once | INTRAVENOUS | Status: AC
  Administered 2010-12-25: 10:00:00 via INTRAVENOUS

## 2010-12-25 ENCOUNTER — Encounter (HOSPITAL_COMMUNITY)
Admission: RE | Disposition: A | Discharge status: Home / Self Care | Payer: Self-pay | Source: Ambulatory Visit | Attending: Internal Medicine

## 2010-12-25 ENCOUNTER — Encounter (INDEPENDENT_AMBULATORY_CARE_PROVIDER_SITE_OTHER): Payer: BC Managed Care – PPO | Admitting: Internal Medicine

## 2010-12-25 ENCOUNTER — Encounter (HOSPITAL_COMMUNITY): Payer: Self-pay | Admitting: *Deleted

## 2010-12-25 ENCOUNTER — Ambulatory Visit (HOSPITAL_COMMUNITY)
Admission: RE | Admit: 2010-12-25 | Discharge: 2010-12-25 | Disposition: A | Discharge status: Home / Self Care | Payer: BC Managed Care – PPO | Source: Ambulatory Visit | Attending: Internal Medicine | Admitting: Internal Medicine

## 2010-12-25 ENCOUNTER — Other Ambulatory Visit: Payer: Self-pay

## 2010-12-25 DIAGNOSIS — K5730 Diverticulosis of large intestine without perforation or abscess without bleeding: Secondary | ICD-10-CM

## 2010-12-25 DIAGNOSIS — Z1211 Encounter for screening for malignant neoplasm of colon: Secondary | ICD-10-CM

## 2010-12-25 DIAGNOSIS — E785 Hyperlipidemia, unspecified: Secondary | ICD-10-CM | POA: Insufficient documentation

## 2010-12-25 DIAGNOSIS — Z79899 Other long term (current) drug therapy: Secondary | ICD-10-CM | POA: Insufficient documentation

## 2010-12-25 DIAGNOSIS — Z7982 Long term (current) use of aspirin: Secondary | ICD-10-CM | POA: Insufficient documentation

## 2010-12-25 DIAGNOSIS — K644 Residual hemorrhoidal skin tags: Secondary | ICD-10-CM | POA: Insufficient documentation

## 2010-12-25 DIAGNOSIS — I10 Essential (primary) hypertension: Secondary | ICD-10-CM | POA: Insufficient documentation

## 2010-12-25 HISTORY — DX: Hypothyroidism, unspecified: E03.9

## 2010-12-25 HISTORY — DX: Hyperlipidemia, unspecified: E78.5

## 2010-12-25 HISTORY — PX: COLONOSCOPY: SHX5424

## 2010-12-25 HISTORY — DX: Gastro-esophageal reflux disease without esophagitis: K21.9

## 2010-12-25 SURGERY — COLONOSCOPY
Anesthesia: Moderate Sedation

## 2010-12-25 MED ORDER — MIDAZOLAM HCL 5 MG/5ML IJ SOLN
INTRAMUSCULAR | Status: AC
  Filled 2010-12-25: qty 10

## 2010-12-25 MED ORDER — STERILE WATER FOR IRRIGATION IR SOLN
Status: DC | PRN
  Administered 2010-12-25: 11:00:00

## 2010-12-25 MED ORDER — MEPERIDINE HCL 50 MG/ML IJ SOLN
INTRAMUSCULAR | Status: DC | PRN
  Administered 2010-12-25 (×2): 25 mg via INTRAVENOUS

## 2010-12-25 MED ORDER — MIDAZOLAM HCL 5 MG/5ML IJ SOLN
INTRAMUSCULAR | Status: DC | PRN
  Administered 2010-12-25 (×2): 2 mg via INTRAVENOUS

## 2010-12-25 MED ORDER — MEPERIDINE HCL 50 MG/ML IJ SOLN
INTRAMUSCULAR | Status: AC
  Filled 2010-12-25: qty 1

## 2010-12-25 NOTE — Op Note (Signed)
COLONOSCOPY PROCEDURE REPORT  PATIENT:  [name]  MR#:  [id number] Birthdate:  12-18-1945, 65 y.o., female Endoscopist:  Dr. Malissa Hippo, MD Referred By:  Dr. Corrie Mckusick, MD Procedure Date: 12/25/2010  Procedure:   Colonoscopy  Indications: Average risk screening colonoscopy. Patient's last exam was in 2001.  Informed Consent:  Procedure and risks were reviewed with the patient. Her questions were answered and informed is obtained.  Medications:  Demerol 50 mg IV Versed 4 mg IV  Description of procedure:  After a digital rectal exam was performed, that colonoscope was advanced from the anus through the rectum and colon to the area of the cecum, ileocecal valve and appendiceal orifice. The cecum was deeply intubated. These structures were well-seen and photographed for the record. From the level of the cecum and ileocecal valve, the scope was slowly and cautiously withdrawn. The mucosal surfaces were carefully surveyed utilizing scope tip to flexion to facilitate fold flattening as needed. The scope was pulled down into the rectum where a thorough exam including retroflexion was performed.  Findings:   Prep excellent. Few small diverticula and sigmoid and transverse colon. Small external hemorrhoid  Therapeutic/Diagnostic Maneuvers Performed:  None  Complications:  None  Cecal Withdrawal Time:  8 minutes  Impression:  Normal colonoscopy except few small diverticula at sigmoid colon and transverse colon. Small external hemorrhoids. Baseline rhythm  suggests a baseline left bundle branch block.  Recommendations:  Will  obtain 12-lead EKG. High fiber diet. Next screening exam in 10 years  [name]  12/25/2010 11:04 AM  CC: Dr. Colette Ribas, MD

## 2010-12-25 NOTE — H&P (Signed)
[name] is an 65 y.o. female.   Chief Complaint: Patient is here for colonoscopy. HPI: Patient is 65 year old Caucasian female who is here for average risk screening colonoscopy. Her last exam was in 2001. She denies abdominal pain diarrhea constipation rectal bleeding. Family history is negative for colorectal carcinoma.  Past Medical History  Diagnosis Date  . Hypertension   . Hyperlipidemia   . Hypothyroidism   . GERD (gastroesophageal reflux disease)   . PONV (postoperative nausea and vomiting)     Past Surgical History  Procedure Date  . Partial hysterectomy   . Neck surgery     secondary to ruptured disc    Family History  Problem Relation Age of Onset  . Colon cancer Mother    Social History:  reports that she has never smoked. She does not have any smokeless tobacco history on file. She reports that she does not drink alcohol or use illicit drugs.  Allergies:  Allergies  Allergen Reactions  . Codeine     Medications Prior to Admission  Medication Dose Route Frequency Provider Last Rate Last Dose  . 0.45 % sodium chloride infusion   Intravenous Once Malissa Hippo, MD 20 mL/hr at 12/25/10 1018    . meperidine (DEMEROL) 50 MG/ML injection           . midazolam (VERSED) 5 MG/5ML injection            Medications Prior to Admission  Medication Sig Dispense Refill  . aspirin 81 MG tablet Take 81 mg by mouth daily.        . calcium-vitamin D (OSCAL WITH D) 500-200 MG-UNIT per tablet Take 1 tablet by mouth daily.        . hydrochlorothiazide 25 MG tablet       . levothyroxine (SYNTHROID, LEVOTHROID) 75 MCG tablet Take 75 mcg by mouth daily.        . multivitamin-iron-minerals-folic acid (CENTRUM) chewable tablet Chew 1 tablet by mouth daily.        . simvastatin (ZOCOR) 10 MG tablet Take 10 mg by mouth at bedtime.        Marland Kitchen VIVELLE-DOT 0.05 MG/24HR         No results found for this or any previous visit (from the past 48 hour(s)). No results found.  Review  of Systems  Constitutional: Negative for weight loss.  Gastrointestinal: Negative for abdominal pain, diarrhea, constipation, blood in stool and melena.    Blood pressure 162/76, pulse 85, temperature 97.6 F (36.4 C), temperature source Oral, resp. rate 22, height 5\' 3"  (1.6 m), weight 155 lb (70.308 kg), SpO2 98.00%. Physical Exam  Constitutional: She appears well-developed and well-nourished.  HENT:  Mouth/Throat: Oropharynx is clear and moist.  Eyes: Conjunctivae are normal. No scleral icterus.  Neck: No thyromegaly present.  Cardiovascular: Normal rate, regular rhythm and normal heart sounds.   No murmur heard. Respiratory: Effort normal and breath sounds normal.  GI: Soft. She exhibits no mass. There is no tenderness.  Musculoskeletal: She exhibits no edema.  Lymphadenopathy:    She has no cervical adenopathy.  Neurological: She is alert.  Skin: Skin is dry.     Assessment/Plan Average risk screening colonoscopy.  Shanah Guimaraes U 12/25/2010, 10:40 AM

## 2011-01-01 ENCOUNTER — Encounter (HOSPITAL_COMMUNITY): Payer: Self-pay | Admitting: Internal Medicine

## 2011-01-03 ENCOUNTER — Encounter: Payer: Self-pay | Admitting: Adult Health

## 2011-01-03 ENCOUNTER — Encounter: Payer: Self-pay | Admitting: *Deleted

## 2011-01-03 ENCOUNTER — Ambulatory Visit (INDEPENDENT_AMBULATORY_CARE_PROVIDER_SITE_OTHER): Payer: BC Managed Care – PPO | Admitting: Adult Health

## 2011-01-03 DIAGNOSIS — R42 Dizziness and giddiness: Secondary | ICD-10-CM

## 2011-01-03 DIAGNOSIS — R9431 Abnormal electrocardiogram [ECG] [EKG]: Secondary | ICD-10-CM

## 2011-01-03 DIAGNOSIS — I10 Essential (primary) hypertension: Secondary | ICD-10-CM

## 2011-01-03 NOTE — Patient Instructions (Signed)
Your physician recommends that you schedule a follow-up appointment in: after your test with Dr Diona Browner  Your physician has requested that you have an echocardiogram. Echocardiography is a painless test that uses sound waves to create images of your heart. It provides your doctor with information about the size and shape of your heart and how well your heart's chambers and valves are working. This procedure takes approximately one hour. There are no restrictions for this procedure.

## 2011-01-03 NOTE — Assessment & Plan Note (Signed)
I have asked for orthostatics to be completed in clinic today as she complains of some dizziness when she gets up from sitting position.  She states her BP at home can go as high as 170 and drop to 116. She is symptomatic with this.  She is not on any antihypertensive medications at this time.    Orthostatics reveal a drop of 30 mmHg, from 174 systolic to 147 mmHg, HR remains normal. Standing BP has BP to and then normalizes 2 minutes later to 155 systolic.  She was mildly dizzy for a few seconds.  I have given her Rx for TED hose to assist with her BP drop and mild LEE.  She will follow-up with Dr. Diona Browner in 1 week to 10 days.

## 2011-01-03 NOTE — Assessment & Plan Note (Signed)
I have reviewed the EKG that was completed during the colonoscopy and the EKG for today. LBBB is not as prominent. The EKG was also read by Dr. Diona Browner on site. He sees a nonspecific conduction delay. Will plan on repeating echocardiogram for LV fx. She has had normal coronary arteries per cath in 2011.  She is basically asymptomatic.  Will see her on follow-up after echo completed.

## 2011-01-03 NOTE — Progress Notes (Signed)
HPI [name] is a 65 y/o patient of [name], , and Dr. Nobie [name] we are seeing at the request of Dr. Minus [name] for abnormal EKG showing new LBBB. She was undergoing colonoscopy on Sept 5, 2012 and heart monitor showed LBBB,  A follow-up EKG confirmed this.  As this was a new finding, she is referred back to cardiology for evaluation.She was last seen by Laura Scrape PA, and was doing well at that time.  She is a former [name] patient.   She was formerly seen by [name] and had cardiac cath in 2011 demonstrating normal coronaries, normal wall motion. Echocardiogram completed in 2010 confirmed normal wall motion with EF of 60% qwith increase relative contribution of  atrial contraction to ventricular filling.  She denies chest pain, shortness of breath.  She is dizzy when she stands up from sitting position.  Allergies  Allergen Reactions  . Codeine     Current Outpatient Prescriptions  Medication Sig Dispense Refill  . aspirin 81 MG tablet Take 81 mg by mouth daily.        . calcium-vitamin D (OSCAL WITH D) 500-200 MG-UNIT per tablet Take 1 tablet by mouth daily.        Marland Kitchen levothyroxine (SYNTHROID, LEVOTHROID) 75 MCG tablet Take 75 mcg by mouth daily.        . multivitamin-iron-minerals-folic acid (CENTRUM) chewable tablet Chew 1 tablet by mouth daily.        . simvastatin (ZOCOR) 10 MG tablet Take 10 mg by mouth at bedtime.        Marland Kitchen VIVELLE-DOT 0.05 MG/24HR         Past Medical History  Diagnosis Date  . Hypertension   . Hyperlipidemia   . Hypothyroidism   . GERD (gastroesophageal reflux disease)   . PONV (postoperative nausea and vomiting)     Past Surgical History  Procedure Date  . Partial hysterectomy   . Neck surgery     secondary to ruptured disc  . Colonoscopy 12/25/2010    Procedure: COLONOSCOPY;  Surgeon: Laura Hippo, MD;  Location: [facility];  Service: Endoscopy;  Laterality: N/A;    AVW:UJWJXB of systems complete and found to be negative unless listed  above PHYSICAL EXAM BP 155/67  Pulse 79 General: Well developed, well nourished, in no acute distress Head: Eyes PERRLA, No xanthomas.   Normal cephalic and atramatic  Lungs: Clear bilaterally to auscultation and percussion. Heart: HRRR S1 S2, without MRG.  Pulses are 2+ & equal.            No carotid bruit. No JVD.  No abdominal bruits. No femoral bruits. Abdomen: Bowel sounds are positive, abdomen soft and non-tender without masses or                  Hernia's noted. Msk:  Back normal, normal gait. Normal strength and tone for age. Extremities: No clubbing, cyanosis 1+ edema.  DP +1 Neuro: Alert and oriented X 3. Psych:  Good affect, responds appropriately, anxious  EKG:  ASSESSMENT AND PLAN

## 2011-01-10 ENCOUNTER — Ambulatory Visit (HOSPITAL_COMMUNITY)
Admission: RE | Admit: 2011-01-10 | Discharge: 2011-01-10 | Disposition: A | Discharge status: Home / Self Care | Payer: BC Managed Care – PPO | Source: Ambulatory Visit | Attending: Cardiology | Admitting: Cardiology

## 2011-01-10 DIAGNOSIS — I10 Essential (primary) hypertension: Secondary | ICD-10-CM | POA: Insufficient documentation

## 2011-01-10 DIAGNOSIS — R9431 Abnormal electrocardiogram [ECG] [EKG]: Secondary | ICD-10-CM

## 2011-01-10 DIAGNOSIS — R42 Dizziness and giddiness: Secondary | ICD-10-CM | POA: Insufficient documentation

## 2011-01-10 DIAGNOSIS — I059 Rheumatic mitral valve disease, unspecified: Secondary | ICD-10-CM

## 2011-01-10 NOTE — Progress Notes (Signed)
*  PRELIMINARY RESULTS* Echocardiogram 2D Echocardiogram has been performed.  [name] 01/10/2011, 8:56 AM2

## 2011-01-16 ENCOUNTER — Encounter: Payer: Self-pay | Admitting: Cardiology

## 2011-01-16 ENCOUNTER — Ambulatory Visit (INDEPENDENT_AMBULATORY_CARE_PROVIDER_SITE_OTHER): Payer: BC Managed Care – PPO | Admitting: Cardiology

## 2011-01-16 DIAGNOSIS — R9431 Abnormal electrocardiogram [ECG] [EKG]: Secondary | ICD-10-CM

## 2011-01-16 DIAGNOSIS — I10 Essential (primary) hypertension: Secondary | ICD-10-CM

## 2011-01-16 NOTE — Assessment & Plan Note (Signed)
Continue followup with Dr. Juanetta Gosling. No changes to present regimen.

## 2011-01-16 NOTE — Progress Notes (Signed)
Clinical Summary [name] is a 65 y.o.female presenting for followup. She was seen recently by [name]. 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  Followup echocardiogram on 9/21 showed LVEF 55-60% with no regional wall motion abnormality, mild mitral regurgitation. We discussed this today.  She reports occasional orthostatic dizziness, mild overall. No palpitations or syncope.   Allergies  Allergen Reactions  . Codeine     Medication list reviewed.  Past Medical History  Diagnosis Date  . Hypertension   . Hyperlipidemia   . Hypothyroidism   . GERD (gastroesophageal reflux disease)   . PONV (postoperative nausea and vomiting)     Past Surgical History  Procedure Date  . Partial hysterectomy   . Neck surgery     secondary to ruptured disc  . Colonoscopy 12/25/2010    Procedure: COLONOSCOPY;  Surgeon: Malissa Hippo, MD;  Location: [facility];  Service: Endoscopy;  Laterality: N/A;    Family History  Problem Relation Age of Onset  . Colon cancer Mother     Social History [name] reports that she has never smoked. She does not have any smokeless tobacco history on file. [name] reports that she does not drink alcohol.  Review of Systems Negative except as outlined above.  Physical Examination Filed Vitals:   01/16/11 1433  BP: 131/63  Pulse: 73  Resp: 18  General: Well developed, well nourished, in no acute distress  Head: Eyes PERRLA, No xanthomas. Normal cephalic and atramatic  Lungs: Clear bilaterally to auscultation and percussion.  Heart: HRRR S1 S2, without MRG. Pulses are 2+ & equal.  No carotid bruit. No JVD. No abdominal bruits. No femoral bruits.  Abdomen: Bowel sounds are positive, abdomen soft and non-tender without masses or  Hernia's noted.  Msk: Back normal, normal gait. Normal strength and tone for age.  Extremities: No clubbing, cyanosis 1+ edema. DP +1    Neuro: Alert and oriented X 3.  Psych: Good affect, responds appropriately, anxious     Problem List and Plan

## 2011-01-16 NOTE — Assessment & Plan Note (Signed)
IVCD and LBBB documented, asymptomatic and associated with normal LVEF and previously documented normal coronaries. No further workup at this time. Can followup as needed.

## 2011-01-16 NOTE — Patient Instructions (Signed)
Your physician recommends that you continue on your current medications as directed. Please refer to the Current Medication list given to you today.  Your physician recommends that you schedule a follow-up appointment in: as needed  

## 2011-08-06 ENCOUNTER — Other Ambulatory Visit: Payer: Self-pay

## 2011-08-06 DIAGNOSIS — R0602 Shortness of breath: Secondary | ICD-10-CM

## 2011-08-12 ENCOUNTER — Ambulatory Visit (HOSPITAL_COMMUNITY)
Admission: RE | Admit: 2011-08-12 | Discharge: 2011-08-12 | Disposition: A | Discharge status: Home / Self Care | Payer: BC Managed Care – PPO | Source: Ambulatory Visit | Attending: Pulmonary Disease | Admitting: Pulmonary Disease

## 2011-08-12 DIAGNOSIS — R0602 Shortness of breath: Secondary | ICD-10-CM | POA: Insufficient documentation

## 2011-08-12 MED ORDER — ALBUTEROL SULFATE (5 MG/ML) 0.5% IN NEBU
2.5000 mg | INHALATION_SOLUTION | Freq: Once | RESPIRATORY_TRACT | Status: AC
  Administered 2011-08-12: 2.5 mg via RESPIRATORY_TRACT

## 2011-08-14 NOTE — Procedures (Signed)
[name], [name]               ACCOUNT NO.:  [id number]  MEDICAL RECORD NO.:  [id number]  LOCATION:                                 FACILITY:  PHYSICIAN:  Karsen Nakanishi L. Juanetta Gosling, M.D.DATE OF BIRTH:  26-Nov-1945  DATE OF PROCEDURE:  08/13/2011 DATE OF DISCHARGE:                           PULMONARY FUNCTION TEST   Reason for pulmonary function testing is shortness of breath.  1. Spirometry shows no definite ventilatory defect and no airflow     obstruction. 2. Lung volumes show air trapping, but otherwise are normal. 3. DLCO is normal. 4. Airway resistance is slightly elevated. 5. There is improvement with inhaled bronchodilator. 6. No definite cause of shortness of breath is seen on this study.     Cadon Raczka L. Juanetta Gosling, M.D.     ELH/MEDQ  D:  08/13/2011  T:  08/13/2011  Job:  [id number]

## 2011-08-20 LAB — PULMONARY FUNCTION TEST

## 2011-10-13 ENCOUNTER — Other Ambulatory Visit: Payer: Self-pay | Admitting: Adult Health

## 2011-10-13 DIAGNOSIS — Z139 Encounter for screening, unspecified: Secondary | ICD-10-CM

## 2011-10-16 ENCOUNTER — Ambulatory Visit (HOSPITAL_COMMUNITY)
Admission: RE | Admit: 2011-10-16 | Discharge: 2011-10-16 | Disposition: A | Discharge status: Home / Self Care | Payer: BC Managed Care – PPO | Source: Ambulatory Visit | Attending: Adult Health | Admitting: Adult Health

## 2011-10-16 DIAGNOSIS — Z1231 Encounter for screening mammogram for malignant neoplasm of breast: Secondary | ICD-10-CM | POA: Insufficient documentation

## 2011-10-16 DIAGNOSIS — Z139 Encounter for screening, unspecified: Secondary | ICD-10-CM

## 2011-12-01 ENCOUNTER — Other Ambulatory Visit: Payer: Self-pay | Admitting: Neurosurgery

## 2011-12-04 ENCOUNTER — Encounter (HOSPITAL_COMMUNITY): Payer: Self-pay | Admitting: Pharmacy Technician

## 2011-12-09 ENCOUNTER — Other Ambulatory Visit: Payer: Self-pay | Admitting: Neurosurgery

## 2011-12-09 DIAGNOSIS — Z780 Asymptomatic menopausal state: Secondary | ICD-10-CM

## 2011-12-10 ENCOUNTER — Ambulatory Visit
Admission: RE | Admit: 2011-12-10 | Discharge: 2011-12-10 | Disposition: A | Discharge status: Home / Self Care | Payer: BC Managed Care – PPO | Source: Ambulatory Visit | Attending: Neurosurgery | Admitting: Neurosurgery

## 2011-12-10 DIAGNOSIS — Z780 Asymptomatic menopausal state: Secondary | ICD-10-CM

## 2011-12-11 NOTE — Pre-Procedure Instructions (Signed)
20 DAYANA DALPORTO  12/11/2011   Your procedure is scheduled on:  Thursday, August 29th  Report to [facility] at 5:30 AM.  Call this number if you have problems the morning of surgery: [phone]   Remember:   Do not eat food or anything to drink:After Midnight.     Take these medicines the morning of surgery with A SIP OF WATER: Amlodipine (Norvasc), Levothyroxine (Synthroid).  Stop taking Naproxen.  Do not take any Aspirin, NSAIDs or herbal medications.   Do not wear jewelry, make-up or nail polish.  Do not wear lotions, powders, or perfumes. You may wear deodorant.  Do not shave 48 hours prior to surgery. Men may shave face and neck.  Do not bring valuables to the hospital.  Contacts, dentures or bridgework may not be worn into surgery.  Leave suitcase in the car. After surgery it may be brought to your room.  For patients admitted to the hospital, checkout time is 11:00 AM the day of discharge.   Patients discharged the day of surgery will not be allowed to drive home.  Name and phone number of your driver:_______________  Special Instructions: CHG Shower Use Special Wash: 1/2 bottle night before surgery and 1/2 bottle morning of surgery.   Please read over the following fact sheets that you were given: Pain Booklet, Coughing and Deep Breathing and Surgical Site Infection Prevention

## 2011-12-12 ENCOUNTER — Encounter (HOSPITAL_COMMUNITY)
Admission: RE | Admit: 2011-12-12 | Discharge: 2011-12-12 | Disposition: A | Discharge status: Home / Self Care | Payer: BC Managed Care – PPO | Source: Ambulatory Visit | Attending: Neurosurgery | Admitting: Neurosurgery

## 2011-12-12 ENCOUNTER — Encounter (HOSPITAL_COMMUNITY): Payer: Self-pay

## 2011-12-12 HISTORY — DX: Orthostatic hypotension: I95.1

## 2011-12-12 HISTORY — DX: Unspecified osteoarthritis, unspecified site: M19.90

## 2011-12-12 LAB — BASIC METABOLIC PANEL
BUN: 16 mg/dL (ref 6–23)
CO2: 28 mEq/L (ref 19–32)
Calcium: 10 mg/dL (ref 8.4–10.5)
Chloride: 104 mEq/L (ref 96–112)
Creatinine, Ser: 0.85 mg/dL (ref 0.50–1.10)
GFR calc Af Amer: 82 mL/min — ABNORMAL LOW (ref >90)
GFR calc non Af Amer: 71 mL/min — ABNORMAL LOW (ref >90)
Glucose, Bld: 92 mg/dL (ref 70–99)
Potassium: 4 mEq/L (ref 3.5–5.1)
Sodium: 142 mEq/L (ref 135–145)

## 2011-12-12 LAB — CBC
HCT: 38 % (ref 36.0–46.0)
Hemoglobin: 13.2 g/dL (ref 12.0–15.0)
MCH: 30.8 pg (ref 26.0–34.0)
MCHC: 34.7 g/dL (ref 30.0–36.0)
MCV: 88.6 fL (ref 78.0–100.0)
Platelets: 209 10*3/uL (ref 150–400)
RBC: 4.29 MIL/uL (ref 3.87–5.11)
RDW: 12.9 % (ref 11.5–15.5)
WBC: 5.8 10*3/uL (ref 4.0–10.5)

## 2011-12-12 LAB — SURGICAL PCR SCREEN
MRSA, PCR: NEGATIVE
Staphylococcus aureus: NEGATIVE

## 2011-12-17 MED ORDER — CEFAZOLIN SODIUM-DEXTROSE 2-3 GM-% IV SOLR
2.0000 g | INTRAVENOUS | Status: AC
  Administered 2011-12-18: 2 g via INTRAVENOUS
  Filled 2011-12-17: qty 50

## 2011-12-18 ENCOUNTER — Inpatient Hospital Stay (HOSPITAL_COMMUNITY)
Admission: RE | Admit: 2011-12-18 | Discharge: 2011-12-19 | DRG: 756 | Disposition: A | Discharge status: Home / Self Care | Payer: BC Managed Care – PPO | Source: Ambulatory Visit | Attending: Neurosurgery | Admitting: Neurosurgery

## 2011-12-18 ENCOUNTER — Encounter (HOSPITAL_COMMUNITY): Payer: Self-pay | Admitting: Anesthesiology

## 2011-12-18 ENCOUNTER — Encounter (HOSPITAL_COMMUNITY)
Admission: RE | Disposition: A | Discharge status: Home / Self Care | Payer: Self-pay | Source: Ambulatory Visit | Attending: Neurosurgery

## 2011-12-18 ENCOUNTER — Ambulatory Visit (HOSPITAL_COMMUNITY): Payer: BC Managed Care – PPO | Admitting: Anesthesiology

## 2011-12-18 ENCOUNTER — Inpatient Hospital Stay (HOSPITAL_COMMUNITY): Payer: BC Managed Care – PPO

## 2011-12-18 DIAGNOSIS — Z7982 Long term (current) use of aspirin: Secondary | ICD-10-CM

## 2011-12-18 DIAGNOSIS — I10 Essential (primary) hypertension: Secondary | ICD-10-CM | POA: Diagnosis present

## 2011-12-18 DIAGNOSIS — M19049 Primary osteoarthritis, unspecified hand: Secondary | ICD-10-CM | POA: Diagnosis present

## 2011-12-18 DIAGNOSIS — E782 Mixed hyperlipidemia: Secondary | ICD-10-CM | POA: Diagnosis present

## 2011-12-18 DIAGNOSIS — Z79899 Other long term (current) drug therapy: Secondary | ICD-10-CM

## 2011-12-18 DIAGNOSIS — T84498A Other mechanical complication of other internal orthopedic devices, implants and grafts, initial encounter: Principal | ICD-10-CM | POA: Diagnosis present

## 2011-12-18 DIAGNOSIS — Y838 Other surgical procedures as the cause of abnormal reaction of the patient, or of later complication, without mention of misadventure at the time of the procedure: Secondary | ICD-10-CM | POA: Diagnosis present

## 2011-12-18 DIAGNOSIS — E039 Hypothyroidism, unspecified: Secondary | ICD-10-CM | POA: Diagnosis present

## 2011-12-18 DIAGNOSIS — Z01818 Encounter for other preprocedural examination: Secondary | ICD-10-CM

## 2011-12-18 DIAGNOSIS — K219 Gastro-esophageal reflux disease without esophagitis: Secondary | ICD-10-CM | POA: Diagnosis present

## 2011-12-18 DIAGNOSIS — Z01812 Encounter for preprocedural laboratory examination: Secondary | ICD-10-CM

## 2011-12-18 HISTORY — PX: POSTERIOR CERVICAL FUSION/FORAMINOTOMY: SHX5038

## 2011-12-18 SURGERY — POSTERIOR CERVICAL FUSION/FORAMINOTOMY LEVEL 2
Anesthesia: General | Site: Neck | Laterality: Bilateral | Wound class: Clean

## 2011-12-18 MED ORDER — SCOPOLAMINE 1 MG/3DAYS TD PT72
MEDICATED_PATCH | TRANSDERMAL | Status: DC | PRN
  Administered 2011-12-18: 1 via TRANSDERMAL

## 2011-12-18 MED ORDER — ZOLPIDEM TARTRATE 5 MG PO TABS: 5.0000 mg | ORAL_TABLET | Freq: Every evening | ORAL | Status: DC | PRN

## 2011-12-18 MED ORDER — ACETAMINOPHEN 10 MG/ML IV SOLN
1000.0000 mg | Freq: Four times a day (QID) | INTRAVENOUS | Status: DC
  Administered 2011-12-18 – 2011-12-19 (×3): 1000 mg via INTRAVENOUS
  Filled 2011-12-18 (×4): qty 100

## 2011-12-18 MED ORDER — MIDAZOLAM HCL 5 MG/5ML IJ SOLN
INTRAMUSCULAR | Status: DC | PRN
  Administered 2011-12-18: 2 mg via INTRAVENOUS

## 2011-12-18 MED ORDER — HYDROXYZINE HCL 50 MG/ML IM SOLN: 50.0000 mg | INTRAMUSCULAR | Status: DC | PRN

## 2011-12-18 MED ORDER — KCL IN DEXTROSE-NACL 20-5-0.45 MEQ/L-%-% IV SOLN
INTRAVENOUS | Status: DC
  Administered 2011-12-18: 15:00:00 via INTRAVENOUS
  Filled 2011-12-18 (×5): qty 1000

## 2011-12-18 MED ORDER — PROMETHAZINE HCL 25 MG/ML IJ SOLN: 6.2500 mg | INTRAMUSCULAR | Status: DC | PRN

## 2011-12-18 MED ORDER — LEVOTHYROXINE SODIUM 75 MCG PO TABS
75.0000 ug | ORAL_TABLET | Freq: Every day | ORAL | Status: DC
  Administered 2011-12-19: 75 ug via ORAL
  Filled 2011-12-18 (×2): qty 1

## 2011-12-18 MED ORDER — BACITRACIN 50000 UNITS IM SOLR
INTRAMUSCULAR | Status: AC
  Filled 2011-12-18: qty 1

## 2011-12-18 MED ORDER — LACTATED RINGERS IV SOLN
INTRAVENOUS | Status: DC | PRN
  Administered 2011-12-18 (×3): via INTRAVENOUS

## 2011-12-18 MED ORDER — OXYCODONE HCL 5 MG PO TABS
5.0000 mg | ORAL_TABLET | ORAL | Status: DC | PRN
  Administered 2011-12-18 – 2011-12-19 (×2): 10 mg via ORAL
  Filled 2011-12-18 (×2): qty 2

## 2011-12-18 MED ORDER — OXYCODONE-ACETAMINOPHEN 5-325 MG PO TABS: 1.0000 | ORAL_TABLET | ORAL | Status: DC | PRN

## 2011-12-18 MED ORDER — SODIUM CHLORIDE 0.9 % IR SOLN
Status: DC | PRN
  Administered 2011-12-18: 08:00:00

## 2011-12-18 MED ORDER — KETOROLAC TROMETHAMINE 30 MG/ML IJ SOLN
30.0000 mg | Freq: Once | INTRAMUSCULAR | Status: AC
  Administered 2011-12-18: 30 mg via INTRAVENOUS

## 2011-12-18 MED ORDER — ALUM & MAG HYDROXIDE-SIMETH 200-200-20 MG/5ML PO SUSP: 30.0000 mL | Freq: Four times a day (QID) | ORAL | Status: DC | PRN

## 2011-12-18 MED ORDER — MAGNESIUM HYDROXIDE 400 MG/5ML PO SUSP: 30.0000 mL | Freq: Every day | ORAL | Status: DC | PRN

## 2011-12-18 MED ORDER — ROCURONIUM BROMIDE 100 MG/10ML IV SOLN
INTRAVENOUS | Status: DC | PRN
  Administered 2011-12-18: 50 mg via INTRAVENOUS

## 2011-12-18 MED ORDER — ACETAMINOPHEN 650 MG RE SUPP: 650.0000 mg | RECTAL | Status: DC | PRN

## 2011-12-18 MED ORDER — SIMVASTATIN 10 MG PO TABS
10.0000 mg | ORAL_TABLET | Freq: Every day | ORAL | Status: DC
  Administered 2011-12-18: 10 mg via ORAL
  Filled 2011-12-18 (×2): qty 1

## 2011-12-18 MED ORDER — HYDROCODONE-ACETAMINOPHEN 5-325 MG PO TABS
ORAL_TABLET | ORAL | Status: AC
  Filled 2011-12-18: qty 1

## 2011-12-18 MED ORDER — MORPHINE SULFATE 4 MG/ML IJ SOLN
4.0000 mg | INTRAMUSCULAR | Status: DC | PRN
  Administered 2011-12-18: 4 mg via INTRAMUSCULAR
  Filled 2011-12-18: qty 1

## 2011-12-18 MED ORDER — HYDROXYZINE HCL 25 MG PO TABS: 50.0000 mg | ORAL_TABLET | ORAL | Status: DC | PRN

## 2011-12-18 MED ORDER — SODIUM CHLORIDE 0.9 % IV SOLN
INTRAVENOUS | Status: AC
  Filled 2011-12-18: qty 500

## 2011-12-18 MED ORDER — LIDOCAINE HCL (CARDIAC) 20 MG/ML IV SOLN
INTRAVENOUS | Status: DC | PRN
  Administered 2011-12-18: 100 mg via INTRAVENOUS

## 2011-12-18 MED ORDER — AMLODIPINE BESYLATE 5 MG PO TABS
5.0000 mg | ORAL_TABLET | Freq: Every day | ORAL | Status: DC
  Administered 2011-12-18: 5 mg via ORAL
  Filled 2011-12-18 (×2): qty 1

## 2011-12-18 MED ORDER — PROPOFOL 10 MG/ML IV EMUL
INTRAVENOUS | Status: DC | PRN
  Administered 2011-12-18: 30 mg via INTRAVENOUS
  Administered 2011-12-18: 160 mg via INTRAVENOUS
  Administered 2011-12-18: 40 mg via INTRAVENOUS

## 2011-12-18 MED ORDER — FENTANYL CITRATE 0.05 MG/ML IJ SOLN
INTRAMUSCULAR | Status: DC | PRN
  Administered 2011-12-18 (×3): 50 ug via INTRAVENOUS
  Administered 2011-12-18: 100 ug via INTRAVENOUS

## 2011-12-18 MED ORDER — FENTANYL CITRATE 0.05 MG/ML IJ SOLN
INTRAMUSCULAR | Status: AC
  Filled 2011-12-18: qty 2

## 2011-12-18 MED ORDER — HYDROCODONE-ACETAMINOPHEN 5-325 MG PO TABS
1.0000 | ORAL_TABLET | ORAL | Status: DC | PRN
  Administered 2011-12-18: 1 via ORAL

## 2011-12-18 MED ORDER — MENTHOL 3 MG MT LOZG: 1.0000 | LOZENGE | OROMUCOSAL | Status: DC | PRN

## 2011-12-18 MED ORDER — SODIUM CHLORIDE 0.9 % IJ SOLN: 3.0000 mL | INTRAMUSCULAR | Status: DC | PRN

## 2011-12-18 MED ORDER — ARTIFICIAL TEARS OP OINT
TOPICAL_OINTMENT | OPHTHALMIC | Status: DC | PRN
  Administered 2011-12-18: 1 via OPHTHALMIC

## 2011-12-18 MED ORDER — GLYCOPYRROLATE 0.2 MG/ML IJ SOLN
INTRAMUSCULAR | Status: DC | PRN
  Administered 2011-12-18: .8 mg via INTRAVENOUS

## 2011-12-18 MED ORDER — ONDANSETRON HCL 4 MG/2ML IJ SOLN
INTRAMUSCULAR | Status: DC | PRN
  Administered 2011-12-18 (×2): 4 mg via INTRAVENOUS

## 2011-12-18 MED ORDER — THROMBIN 20000 UNITS EX SOLR
CUTANEOUS | Status: DC | PRN
  Administered 2011-12-18: 08:00:00 via TOPICAL

## 2011-12-18 MED ORDER — BISACODYL 10 MG RE SUPP: 10.0000 mg | Freq: Every day | RECTAL | Status: DC | PRN

## 2011-12-18 MED ORDER — FENTANYL CITRATE 0.05 MG/ML IJ SOLN
25.0000 ug | INTRAMUSCULAR | Status: DC | PRN
  Administered 2011-12-18 (×4): 25 ug via INTRAVENOUS

## 2011-12-18 MED ORDER — KETOROLAC TROMETHAMINE 30 MG/ML IJ SOLN
30.0000 mg | Freq: Four times a day (QID) | INTRAMUSCULAR | Status: DC
  Administered 2011-12-18 – 2011-12-19 (×3): 30 mg via INTRAVENOUS
  Filled 2011-12-18 (×7): qty 1

## 2011-12-18 MED ORDER — CYCLOBENZAPRINE HCL 10 MG PO TABS
10.0000 mg | ORAL_TABLET | Freq: Three times a day (TID) | ORAL | Status: DC | PRN
  Administered 2011-12-18: 10 mg via ORAL

## 2011-12-18 MED ORDER — PHENOL 1.4 % MT LIQD: 1.0000 | OROMUCOSAL | Status: DC | PRN

## 2011-12-18 MED ORDER — ACETAMINOPHEN 10 MG/ML IV SOLN
INTRAVENOUS | Status: AC
  Filled 2011-12-18: qty 100

## 2011-12-18 MED ORDER — LIDOCAINE-EPINEPHRINE 1 %-1:100000 IJ SOLN
INTRAMUSCULAR | Status: DC | PRN
  Administered 2011-12-18: 5 mL

## 2011-12-18 MED ORDER — CYCLOBENZAPRINE HCL 10 MG PO TABS
ORAL_TABLET | ORAL | Status: AC
  Filled 2011-12-18: qty 1

## 2011-12-18 MED ORDER — KETOROLAC TROMETHAMINE 30 MG/ML IJ SOLN
INTRAMUSCULAR | Status: AC
  Filled 2011-12-18: qty 1

## 2011-12-18 MED ORDER — ACETAMINOPHEN 325 MG PO TABS: 650.0000 mg | ORAL_TABLET | ORAL | Status: DC | PRN

## 2011-12-18 MED ORDER — SODIUM CHLORIDE 0.9 % IJ SOLN
3.0000 mL | Freq: Two times a day (BID) | INTRAMUSCULAR | Status: DC
  Administered 2011-12-18 (×2): 3 mL via INTRAVENOUS

## 2011-12-18 MED ORDER — 0.9 % SODIUM CHLORIDE (POUR BTL) OPTIME
TOPICAL | Status: DC | PRN
  Administered 2011-12-18: 1000 mL

## 2011-12-18 MED ORDER — ACETAMINOPHEN 10 MG/ML IV SOLN
INTRAVENOUS | Status: DC | PRN
  Administered 2011-12-18: 1000 mg via INTRAVENOUS

## 2011-12-18 MED ORDER — NEOSTIGMINE METHYLSULFATE 1 MG/ML IJ SOLN
INTRAMUSCULAR | Status: DC | PRN
  Administered 2011-12-18: 5 mg via INTRAVENOUS

## 2011-12-18 MED ORDER — DEXAMETHASONE SODIUM PHOSPHATE 4 MG/ML IJ SOLN
INTRAMUSCULAR | Status: DC | PRN
  Administered 2011-12-18: 8 mg via INTRAVENOUS

## 2011-12-18 MED ORDER — BUPIVACAINE HCL (PF) 0.5 % IJ SOLN
INTRAMUSCULAR | Status: DC | PRN
  Administered 2011-12-18: 5 mL

## 2011-12-18 SURGICAL SUPPLY — 69 items
ADH SKN CLS APL DERMABOND .7 (GAUZE/BANDAGES/DRESSINGS) ×1
BAG DECANTER FOR FLEXI CONT (MISCELLANEOUS) ×2 IMPLANT
BANDAGE GAUZE 4  KLING STR (GAUZE/BANDAGES/DRESSINGS) IMPLANT
BIT DRILL NEURO 2X3.1 SFT TUCH (MISCELLANEOUS) ×1 IMPLANT
BIT DRILL WIRE PASS 1.3MM (BIT) IMPLANT
BLADE SURG 11 STRL SS (BLADE) ×2 IMPLANT
BLADE SURG ROTATE 9660 (MISCELLANEOUS) ×1 IMPLANT
CANISTER SUCTION 2500CC (MISCELLANEOUS) ×2 IMPLANT
CLOTH BEACON ORANGE TIMEOUT ST (SAFETY) ×2 IMPLANT
CONT SPEC 4OZ CLIKSEAL STRL BL (MISCELLANEOUS) ×2 IMPLANT
COVER MAYO STAND STRL (DRAPES) IMPLANT
COVER TABLE BACK 60X90 (DRAPES) ×1 IMPLANT
DECANTER SPIKE VIAL GLASS SM (MISCELLANEOUS) ×2 IMPLANT
DERMABOND ADVANCED (GAUZE/BANDAGES/DRESSINGS) ×1
DERMABOND ADVANCED .7 DNX12 (GAUZE/BANDAGES/DRESSINGS) ×1 IMPLANT
DRAPE C-ARM 42X72 X-RAY (DRAPES) ×5 IMPLANT
DRAPE LAPAROTOMY 100X72 PEDS (DRAPES) ×2 IMPLANT
DRAPE POUCH INSTRU U-SHP 10X18 (DRAPES) ×2 IMPLANT
DRAPE PROXIMA HALF (DRAPES) IMPLANT
DRILL NEURO 2X3.1 SOFT TOUCH (MISCELLANEOUS) ×2
DRILL WIRE PASS 1.3MM (BIT)
DRSG EMULSION OIL 3X3 NADH (GAUZE/BANDAGES/DRESSINGS) IMPLANT
ELECT REM PT RETURN 9FT ADLT (ELECTROSURGICAL) ×2
ELECTRODE REM PT RTRN 9FT ADLT (ELECTROSURGICAL) ×1 IMPLANT
EVACUATOR 1/8 PVC DRAIN (DRAIN) IMPLANT
GAUZE SPONGE 4X4 16PLY XRAY LF (GAUZE/BANDAGES/DRESSINGS) IMPLANT
GLOVE BIOGEL PI IND STRL 8 (GLOVE) ×1 IMPLANT
GLOVE BIOGEL PI INDICATOR 8 (GLOVE) ×2
GLOVE ECLIPSE 7.5 STRL STRAW (GLOVE) ×4 IMPLANT
GLOVE EXAM NITRILE LRG STRL (GLOVE) IMPLANT
GLOVE EXAM NITRILE MD LF STRL (GLOVE) ×1 IMPLANT
GLOVE EXAM NITRILE XL STR (GLOVE) IMPLANT
GLOVE EXAM NITRILE XS STR PU (GLOVE) IMPLANT
GOWN BRE IMP SLV AUR LG STRL (GOWN DISPOSABLE) ×1 IMPLANT
GOWN BRE IMP SLV AUR XL STRL (GOWN DISPOSABLE) ×3 IMPLANT
GOWN STRL REIN 2XL LVL4 (GOWN DISPOSABLE) ×1 IMPLANT
HEMOSTAT SURGICEL 2X14 (HEMOSTASIS) IMPLANT
KIT BASIN OR (CUSTOM PROCEDURE TRAY) ×2 IMPLANT
KIT INFUSE X SMALL 1.4CC (Orthopedic Implant) ×1 IMPLANT
KIT ROOM TURNOVER OR (KITS) ×2 IMPLANT
NDL HYPO 18GX1.5 BLUNT FILL (NEEDLE) IMPLANT
NDL SPNL 18GX3.5 QUINCKE PK (NEEDLE) ×1 IMPLANT
NDL SPNL 22GX3.5 QUINCKE BK (NEEDLE) IMPLANT
NEEDLE HYPO 18GX1.5 BLUNT FILL (NEEDLE) IMPLANT
NEEDLE SPNL 18GX3.5 QUINCKE PK (NEEDLE) ×2 IMPLANT
NEEDLE SPNL 22GX3.5 QUINCKE BK (NEEDLE) ×2 IMPLANT
NS IRRIG 1000ML POUR BTL (IV SOLUTION) ×2 IMPLANT
PACK LAMINECTOMY NEURO (CUSTOM PROCEDURE TRAY) ×2 IMPLANT
PAD ARMBOARD 7.5X6 YLW CONV (MISCELLANEOUS) ×6 IMPLANT
PIN MAYFIELD SKULL DISP (PIN) ×2 IMPLANT
ROD 120MM (Rod) ×2 IMPLANT
ROD SPNL 240X3.5XNS LF TI (Rod) IMPLANT
SCREW MA MM 3.5X12 (Screw) ×6 IMPLANT
SCREW SET THREADED (Screw) ×6 IMPLANT
SPONGE GAUZE 4X4 12PLY (GAUZE/BANDAGES/DRESSINGS) IMPLANT
SPONGE LAP 4X18 X RAY DECT (DISPOSABLE) IMPLANT
SPONGE SURGIFOAM ABS GEL 100 (HEMOSTASIS) ×2 IMPLANT
STAPLER SKIN PROX WIDE 3.9 (STAPLE) ×2 IMPLANT
STRIP BIOACTIVE VITOSS 25X52X4 (Orthopedic Implant) ×1 IMPLANT
SUT ETHILON 3 0 FSL (SUTURE) IMPLANT
SUT VIC AB 0 CT1 18XCR BRD8 (SUTURE) ×1 IMPLANT
SUT VIC AB 0 CT1 8-18 (SUTURE) ×2
SUT VIC AB 2-0 CP2 18 (SUTURE) ×3 IMPLANT
SYR 20ML ECCENTRIC (SYRINGE) ×2 IMPLANT
TOWEL OR 17X24 6PK STRL BLUE (TOWEL DISPOSABLE) ×2 IMPLANT
TOWEL OR 17X26 10 PK STRL BLUE (TOWEL DISPOSABLE) ×2 IMPLANT
TRAY FOLEY CATH 14FRSI W/METER (CATHETERS) IMPLANT
UNDERPAD 30X30 INCONTINENT (UNDERPADS AND DIAPERS) ×2 IMPLANT
WATER STERILE IRR 1000ML POUR (IV SOLUTION) ×2 IMPLANT

## 2011-12-18 NOTE — Anesthesia Procedure Notes (Signed)
Procedure Name: Intubation Date/Time: 12/18/2011 7:52 AM Performed by: Sherie Don Pre-anesthesia Checklist: Patient identified, Emergency Drugs available, Suction available, Patient being monitored and Timeout performed Patient Re-evaluated:Patient Re-evaluated prior to inductionOxygen Delivery Method: Circle system utilized Preoxygenation: Pre-oxygenation with 100% oxygen Intubation Type: IV induction Ventilation: Mask ventilation without difficulty Laryngoscope Size: Mac and 3 Grade View: Grade II Tube type: Oral Number of attempts: 1 Airway Equipment and Method: Stylet Placement Confirmation: ETT inserted through vocal cords under direct vision,  positive ETCO2 and breath sounds checked- equal and bilateral Secured at: 23 cm Tube secured with: Tape Dental Injury: Teeth and Oropharynx as per pre-operative assessment

## 2011-12-18 NOTE — OR Nursing (Signed)
Patient with Latex Allergy - Dr. Newell Coral aware - used Latex gloves

## 2011-12-18 NOTE — Anesthesia Postprocedure Evaluation (Signed)
  Anesthesia Post-op Note  Patient: [name]  Procedure(s) Performed: Procedure(s) (LRB): POSTERIOR CERVICAL FUSION/FORAMINOTOMY LEVEL 2 (Bilateral)  Patient Location: PACU  Anesthesia Type: General  Level of Consciousness: awake and alert   Airway and Oxygen Therapy: Patient Spontanous Breathing  Post-op Pain: mild  Post-op Assessment: Post-op Vital signs reviewed, Patient's Cardiovascular Status Stable, Respiratory Function Stable, Patent Airway, No signs of Nausea or vomiting and Pain level controlled  Post-op Vital Signs: stable  Complications: No apparent anesthesia complications 

## 2011-12-18 NOTE — Progress Notes (Signed)
Filed Vitals:   12/18/11 1101 12/18/11 1130 12/18/11 1243 12/18/11 1628  BP: 178/59 193/65 161/73 158/75  Pulse: 58 60 66 77  Temp:  97.5 F (36.4 C) 97.8 F (36.6 C) 97.5 F (36.4 C)  TempSrc:      Resp: 12 18 18 18   SpO2: 100% 96% 98% 96%    Patient's resting comfortably in bed. Has ambulate in the halls. Voiding. Taking well by mouth. Wound clean dry.  Plan: Doing well following surgery, continue progress to postoperative recovery.  Hewitt Shorts, MD 12/18/2011, 5:36 PM

## 2011-12-18 NOTE — Preoperative (Signed)
Beta Blockers   Reason not to administer Beta Blockers:Not Applicable 

## 2011-12-18 NOTE — H&P (Signed)
Subjective: Patient is a 66 y.o. 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 Patient is a 2-1/2 years status post a 2 level C4-5 and C5-6 ACDF. She's been having neck pain extending down to the shoulders bilaterally. Is not responded to symptomatic treatment. X-rays show evidence of nonunion and pseudoarthrosis at both C4-5 and C5-6. MRI scan shows minimal degenerative changes otherwise the cervical spine. Patient is now admitted for a 2 level posterior cervical arthrodesis from C4-C6.    Patient Active Problem List   Diagnosis Date Noted  . Nonspecific abnormal electrocardiogram (ECG) (EKG) 01/03/2011  . HYPERLIPIDEMIA-MIXED 05/08/2010  . HYPERTENSION, BENIGN 05/08/2010  . OSTEOARTHROSIS UNSPEC WHETHER GEN/LOCALIZED HAND 01/24/2010   Past Medical History  Diagnosis Date  . Hypertension   . Hyperlipidemia   . Hypothyroidism   . GERD (gastroesophageal reflux disease)   . PONV (postoperative nausea and vomiting)     Did not have this problem- 2010  . Orthostatic hypotension   . Shortness of breath     with exertion  . Arthritis     finger     Past Surgical History  Procedure Date  . Partial hysterectomy   . Neck surgery     secondary to ruptured disc  . Colonoscopy 12/25/2010    Procedure: COLONOSCOPY;  Surgeon: Malissa Hippo, MD;  Location: [facility];  Service: Endoscopy;  Laterality: N/A;  . Cardiac catheterization     x 2 at [facility] "nothing showed up"  . Abdominal hysterectomy   . Wrist fusion     Right- torn ligament    Prescriptions prior to admission  Medication Sig Dispense Refill  . amLODipine (NORVASC) 5 MG tablet Take 5 mg by mouth daily.       Marland Kitchen aspirin 81 MG tablet Take 81 mg by mouth daily.        . calcium-vitamin D (OSCAL WITH D) 500-200 MG-UNIT per tablet Take 1 tablet by mouth daily.        Marland Kitchen levothyroxine (SYNTHROID, LEVOTHROID) 75 MCG tablet Take 75 mcg by mouth daily.        .  multivitamin-iron-minerals-folic acid (CENTRUM) chewable tablet Chew 1 tablet by mouth daily.        . naproxen sodium (ANAPROX) 220 MG tablet Take 440 mg by mouth 2 (two) times daily as needed. For pain      . pravastatin (PRAVACHOL) 20 MG tablet Take 20 mg by mouth daily.      Marland Kitchen VIVELLE-DOT 0.05 MG/24HR Place 1 patch onto the skin once a week.        Allergies  Allergen Reactions  . Codeine Nausea And Vomiting  . Other Nausea And Vomiting    Most anesthesia   . Latex Rash    History  Substance Use Topics  . Smoking status: Never Smoker   . Smokeless tobacco: Not on file  . Alcohol Use: No    Family History  Problem Relation Age of Onset  . Colon cancer Mother      Review of Systems A comprehensive review of systems was negative.  Objective: Vital signs in last 24 hours: Temp:  [97.8 F (36.6 C)] 97.8 F (36.6 C) (08/29 0616) Pulse Rate:  [59] 59  (08/29 0616) Resp:  [18] 18  (08/29 0616) BP: (212)/(81) 212/81 mmHg (08/29 0616) SpO2:  [98 %] 98 % (08/29 0616)  EXAM: Patient well-developed well-nourished white female in no acute distress. Lungs are clear to auscultation ,  the patient has symmetrical respiratory excursion. Heart has a regular rate and rhythm normal S1 and S2 no murmur.   Abdomen is soft nontender nondistended bowel sounds are present. Extremity examination shows no clubbing cyanosis or edema. Musculoskeletal examination shows mild to palpation in the base of her neck. Range of motion neck is good flexion extension and lateral flexion to either side.  Motor examination shows 5 over 5 strength in the upper extremities including the deltoid biceps triceps and intrinsics and grip. Sensation is intact to pinprick throughout the digits of the upper extremities although somewhat more intense in the right hand as compared to the left than. Reflexes are symmetrical and without evidence of pathologic reflexes. Patient has a normal gait and stance.   Data Review:CBC      Component Value Date/Time   WBC 5.8 12/12/2011 1237   RBC 4.29 12/12/2011 1237   HGB 13.2 12/12/2011 1237   HCT 38.0 12/12/2011 1237   PLT 209 12/12/2011 1237   MCV 88.6 12/12/2011 1237   MCH 30.8 12/12/2011 1237   MCHC 34.7 12/12/2011 1237   RDW 12.9 12/12/2011 1237   LYMPHSABS 1.6 05/15/2009 1151   MONOABS 0.5 05/15/2009 1151   EOSABS 0.1 05/15/2009 1151   BASOSABS 0.0 05/15/2009 1151                          BMET    Component Value Date/Time   NA 142 12/12/2011 1237   K 4.0 12/12/2011 1237   CL 104 12/12/2011 1237   CO2 28 12/12/2011 1237   GLUCOSE 92 12/12/2011 1237   BUN 16 12/12/2011 1237   CREATININE 0.85 12/12/2011 1237   CALCIUM 10.0 12/12/2011 1237   GFRNONAA 71* 12/12/2011 1237   GFRAA 82* 12/12/2011 1237     Assessment/Plan: Patient with nonunion and pseudoarthrosis at C4-5 and C5-6, following 2 level ACF over to a half years ago. Now admitted for C4-C6 posterior cervical arthrodesis with lateral mass screws and rods, and bone graft.  I've discussed with the patient the nature of his condition, the nature the surgical procedure, the typical length of surgery, hospital stay, and overall recuperation. We discussed limitations postoperatively. I discussed risks of surgery including risks of infection, bleeding, possibly need for transfusion, the risk of nerve root dysfunction with pain, weakness, numbness, or paresthesias, the risk of spinal cord dysfunction with paralysis of all 4 limbs and quadriplegia, and the risk of dural tear and CSF leakage and possible need for further surgery, the risk of failure of the arthrodesis and the possible need for further surgery, and the risk of anesthetic complications including myocardial infarction, stroke, pneumonia, and death. We also discussed the need for postoperative immobilization in a cervical collar. Understanding all this the patient does wish to proceed with surgery and is admitted for such.    Hewitt Shorts, MD 12/18/2011 7:38 AM

## 2011-12-18 NOTE — Transfer of Care (Signed)
Immediate Anesthesia Transfer of Care Note  Patient: [name]  Procedure(s) Performed: Procedure(s) (LRB): POSTERIOR CERVICAL FUSION/FORAMINOTOMY LEVEL 2 (Bilateral)  Patient Location: PACU  Anesthesia Type: General  Level of Consciousness: sedated and patient cooperative  Airway & Oxygen Therapy: Patient Spontanous Breathing and Patient connected to face mask oxygen  Post-op Assessment: Report given to PACU RN, Post -op Vital signs reviewed and stable and Patient moving all extremities X 4  Post vital signs: Reviewed and stable  Complications: No apparent anesthesia complications

## 2011-12-18 NOTE — Progress Notes (Signed)
UR COMPLETED  

## 2011-12-18 NOTE — Op Note (Signed)
12/18/2011  9:36 AM  PATIENT:  [name]  66 y.o. female  PRE-OPERATIVE DIAGNOSIS:  Cervical four-five, five-six Non union, Cervicalgia  POST-OPERATIVE DIAGNOSIS:  Cervical four-five, five-six Non union, Cervicalgia  PROCEDURE:  Procedure(s): POSTERIOR CERVICAL FUSION/FORAMINOTOMY LEVEL 2:  C4-C6 posterior cervical arthrodesis with viewpoint lateral mass screws and rods, and Vitoss BA and infuse  SURGEON:  Surgeon(s): Hewitt Shorts, MD Reinaldo Meeker, MD  ASSISTANTS: Aliene Beams, M.D.  ANESTHESIA:   General  EBL:  Total I/O In: 1600 [I.V.:1600] Out: 100 [Blood:100]  BLOOD ADMINISTERED:none  COUNT: Correct per nursing staff  DICTATION: Patient was brought to the operating room, placed under general endotracheal anesthesia. 3 pin Mayfield head holder was applied, and the patient was turned to a prone position. The posterior aspect of the neck and upper back were prepped with Betadine soap and solution and draped in a sterile fashion. The midline was infiltrated with local anesthetic with epinephrine, and then a midline incision is made carried down to subcutaneous tissue. Bipolar cautery and electrocautery used to maintain hemostasis. Dissection was carried down to the cervical fascia which was incised bilaterally and the paracervical musculature was dissected from the spinous process lamina in a subperiosteal fashion. The C-arm fluoroscope was draped and brought in the field and we localized the C4, C5, and C6 spinous processes, lamina, and facet complexes. Pilot holes were started in the lateral masses with the high-speed drill, the screw hole was then drilled, and then the posterior lamina was tapped, and 3.5 x 12 mm screws were placed bilaterally at C4, C5, and C6. We then cut 120 mm rod down to 2 45 mm rod lengths. The rods were placed within the screw heads, and secured with locking caps. Once all 6 locking caps were placed final tightening was performed against a counter  torque. The wound was irrigated extensively with bacitracin solution once the screws were placed, and prior to packing Vitoss BA and infuse. All of the screw placement procedure was done using C-arm fluoroscopic guidance. We then used the high-speed drill to decorticate the lamina of C4, C5, and C6, and facet joints at C4-5 and C5-6. We then packed small pledgets of infuse and Vitoss BA in the facet joints, and laid infuse and Vitoss BA over the lamina of C4, C5, and C6. We then proceeded with closure. Deep fascia was closed with interrupted undyed 1 Vicryl sutures subcutaneous and subcuticular closed interrupted inverted 2-0 Vicryl sutures. Skin is approximate Dermabond. Following surgery the patient was turned to a prone position, the 3 pin Mayfield head holder was removed, and the patient is to be reversed and the anesthetic, extubated, and transferred to the recovery room for further care.  PLAN OF CARE: Admit for overnight observation  PATIENT DISPOSITION:  PACU - hemodynamically stable.   Delay start of Pharmacological VTE agent (>24hrs) due to surgical blood loss or risk of bleeding:  yes

## 2011-12-18 NOTE — Plan of Care (Signed)
Problem: Consults Goal: Diagnosis - Spinal Surgery Outcome: Completed/Met Date Met:  12/18/11 Cervical Spine Fusion

## 2011-12-18 NOTE — Anesthesia Preprocedure Evaluation (Addendum)
Anesthesia Evaluation  Patient identified by MRN, date of birth, ID band  Reviewed: Allergy & Precautions, H&P , NPO status , Patient's Chart, lab work & pertinent test results  History of Anesthesia Complications (+) PONV  Airway Mallampati: II TM Distance: >3 FB Neck ROM: Full    Dental  (+) Teeth Intact and Dental Advisory Given   Pulmonary  breath sounds clear to auscultation  Pulmonary exam normal       Cardiovascular hypertension, Pt. on medications Rhythm:Regular Rate:Normal     Neuro/Psych    GI/Hepatic GERD-  Controlled,  Endo/Other  Hypothyroidism   Renal/GU      Musculoskeletal  (+) Arthritis -, Osteoarthritis,    Abdominal   Peds  Hematology   Anesthesia Other Findings   Reproductive/Obstetrics                         Anesthesia Physical Anesthesia Plan  ASA: II  Anesthesia Plan: General   Post-op Pain Management:    Induction: Intravenous  Airway Management Planned: Oral ETT  Additional Equipment:   Intra-op Plan:   Post-operative Plan: Extubation in OR  Informed Consent: I have reviewed the patients History and Physical, chart, labs and discussed the procedure including the risks, benefits and alternatives for the proposed anesthesia with the patient or authorized representative who has indicated his/her understanding and acceptance.   Dental advisory given  Plan Discussed with: CRNA and Surgeon  Anesthesia Plan Comments:        Anesthesia Quick Evaluation

## 2011-12-18 NOTE — Anesthesia Postprocedure Evaluation (Signed)
  Anesthesia Post-op Note  Patient: [name]  Procedure(s) Performed: Procedure(s) (LRB): POSTERIOR CERVICAL FUSION/FORAMINOTOMY LEVEL 2 (Bilateral)  Patient Location: PACU  Anesthesia Type: General  Level of Consciousness: awake and alert   Airway and Oxygen Therapy: Patient Spontanous Breathing  Post-op Pain: mild  Post-op Assessment: Post-op Vital signs reviewed, Patient's Cardiovascular Status Stable, Respiratory Function Stable, Patent Airway, No signs of Nausea or vomiting and Pain level controlled  Post-op Vital Signs: stable  Complications: No apparent anesthesia complications

## 2011-12-19 ENCOUNTER — Encounter (HOSPITAL_COMMUNITY): Payer: Self-pay | Admitting: Neurosurgery

## 2011-12-19 MED ORDER — CYCLOBENZAPRINE HCL 10 MG PO TABS: 5.0000 mg | ORAL_TABLET | Freq: Three times a day (TID) | ORAL | Status: AC | PRN

## 2011-12-19 MED ORDER — OXYCODONE-ACETAMINOPHEN 5-325 MG PO TABS: 1.0000 | ORAL_TABLET | ORAL | Status: AC | PRN

## 2011-12-19 NOTE — Discharge Summary (Signed)
Physician Discharge Summary  Patient ID: [name] MRN: [id number] DOB/AGE: 24-Feb-1946 66 y.o.  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 Postoperatively she had moderate incisional pain, but otherwise is doing well. Her wound is clean and dry. She is up and living in the halls. She is asking to be discharged home. She's been given instructions regarding wound care and activities. She is to return for followup with me in 3 weeks.  Discharge Exam: Blood pressure 173/74, pulse 71, temperature 98.5 F (36.9 C), temperature source Oral, resp. rate 18, SpO2 94.00%.  Disposition: Home   Medication List  As of 12/19/2011  9:03 AM   TAKE these medications         amLODipine 5 MG tablet   Commonly known as: NORVASC   Take 5 mg by mouth daily.      aspirin 81 MG tablet   Take 81 mg by mouth daily.      calcium-vitamin D 500-200 MG-UNIT per tablet   Commonly known as: OSCAL WITH D   Take 1 tablet by mouth daily.      cyclobenzaprine 10 MG tablet   Commonly known as: FLEXERIL   Take 0.5-1 tablets (5-10 mg total) by mouth 3 (three) times daily as needed for muscle spasms.      levothyroxine 75 MCG tablet   Commonly known as: SYNTHROID, LEVOTHROID   Take 75 mcg by mouth daily.      multivitamin-iron-minerals-folic acid chewable tablet   Chew 1 tablet by mouth daily.      naproxen sodium 220 MG tablet   Commonly known as: ANAPROX   Take 440 mg by mouth 2 (two) times daily as needed. For pain      oxyCODONE-acetaminophen 5-325 MG per tablet   Commonly known as: PERCOCET/ROXICET   Take 1-2 tablets by mouth every 4 (four) hours as needed for  pain.      pravastatin 20 MG tablet   Commonly known as: PRAVACHOL   Take 20 mg by mouth daily.      VIVELLE-DOT 0.05 MG/24HR   Generic drug: estradiol   Place 1 patch onto the skin once a week.             SignedHewitt Shorts, MD 12/19/2011, 9:03 AM

## 2011-12-30 ENCOUNTER — Other Ambulatory Visit (HOSPITAL_COMMUNITY): Payer: Self-pay | Admitting: Neurosurgery

## 2011-12-30 DIAGNOSIS — M542 Cervicalgia: Secondary | ICD-10-CM

## 2012-08-05 ENCOUNTER — Emergency Department (HOSPITAL_COMMUNITY): Payer: Worker's Compensation

## 2012-08-05 ENCOUNTER — Emergency Department (HOSPITAL_COMMUNITY)
Admission: EM | Admit: 2012-08-05 | Discharge: 2012-08-05 | Disposition: A | Discharge status: Home / Self Care | Payer: Worker's Compensation | Attending: Emergency Medicine | Admitting: Emergency Medicine

## 2012-08-05 ENCOUNTER — Encounter (HOSPITAL_COMMUNITY): Payer: Self-pay | Admitting: *Deleted

## 2012-08-05 DIAGNOSIS — I951 Orthostatic hypotension: Secondary | ICD-10-CM | POA: Insufficient documentation

## 2012-08-05 DIAGNOSIS — E039 Hypothyroidism, unspecified: Secondary | ICD-10-CM | POA: Insufficient documentation

## 2012-08-05 DIAGNOSIS — Y9389 Activity, other specified: Secondary | ICD-10-CM | POA: Insufficient documentation

## 2012-08-05 DIAGNOSIS — Z7982 Long term (current) use of aspirin: Secondary | ICD-10-CM | POA: Insufficient documentation

## 2012-08-05 DIAGNOSIS — S60222A Contusion of left hand, initial encounter: Secondary | ICD-10-CM

## 2012-08-05 DIAGNOSIS — I10 Essential (primary) hypertension: Secondary | ICD-10-CM | POA: Insufficient documentation

## 2012-08-05 DIAGNOSIS — E785 Hyperlipidemia, unspecified: Secondary | ICD-10-CM | POA: Insufficient documentation

## 2012-08-05 DIAGNOSIS — Z23 Encounter for immunization: Secondary | ICD-10-CM | POA: Insufficient documentation

## 2012-08-05 DIAGNOSIS — Y990 Civilian activity done for income or pay: Secondary | ICD-10-CM | POA: Insufficient documentation

## 2012-08-05 DIAGNOSIS — Z8719 Personal history of other diseases of the digestive system: Secondary | ICD-10-CM | POA: Insufficient documentation

## 2012-08-05 DIAGNOSIS — Z79899 Other long term (current) drug therapy: Secondary | ICD-10-CM | POA: Insufficient documentation

## 2012-08-05 DIAGNOSIS — W208XXA Other cause of strike by thrown, projected or falling object, initial encounter: Secondary | ICD-10-CM | POA: Insufficient documentation

## 2012-08-05 DIAGNOSIS — Y9289 Other specified places as the place of occurrence of the external cause: Secondary | ICD-10-CM | POA: Insufficient documentation

## 2012-08-05 DIAGNOSIS — S60229A Contusion of unspecified hand, initial encounter: Secondary | ICD-10-CM | POA: Insufficient documentation

## 2012-08-05 DIAGNOSIS — Z8739 Personal history of other diseases of the musculoskeletal system and connective tissue: Secondary | ICD-10-CM | POA: Insufficient documentation

## 2012-08-05 MED ORDER — TETANUS-DIPHTH-ACELL PERTUSSIS 5-2.5-18.5 LF-MCG/0.5 IM SUSP
0.5000 mL | Freq: Once | INTRAMUSCULAR | Status: AC
  Administered 2012-08-05: 0.5 mL via INTRAMUSCULAR
  Filled 2012-08-05: qty 0.5

## 2012-08-05 MED ORDER — OXYCODONE-ACETAMINOPHEN 5-325 MG PO TABS
1.0000 | ORAL_TABLET | Freq: Once | ORAL | Status: AC
  Administered 2012-08-05: 1 via ORAL
  Filled 2012-08-05: qty 1

## 2012-08-05 MED ORDER — HYDROCODONE-ACETAMINOPHEN 5-325 MG PO TABS: 1.0000 | ORAL_TABLET | ORAL | Status: DC | PRN

## 2012-08-05 NOTE — ED Notes (Signed)
Pt hit hand on piece of equipment at work. Hematoma noted to top of left hand.

## 2012-08-05 NOTE — ED Provider Notes (Signed)
History     CSN: [id number]  Arrival date & time 08/05/12  0601   First MD Initiated Contact with Patient 08/05/12 [phone]      Chief Complaint  Patient presents with  . Hand Injury     HPI Patient reports injuring the dorsal surface of her left hand while at work.  Something heavy fell onto her left hand she had immediate swelling of her left hand this developed a hematoma.  She presents the emergency department because of hematoma.  There is a small overlying abrasion.  Her tetanus has not been updated in the past 5 years.  Her pain is mild to moderate in severity at this time.  Her pain is worsened by palpation and movement.  Nothing improves her hand except for rest.  She feels like over the last 30 minutes the swelling has not significantly worsened.  She is on aspirin but no other anticoagulants.  She denies numbness or tingling.  No weakness of her fingers.  No other complaints.   Past Medical History  Diagnosis Date  . Hypertension   . Hyperlipidemia   . Hypothyroidism   . GERD (gastroesophageal reflux disease)   . PONV (postoperative nausea and vomiting)     Did not have this problem- 2010  . Orthostatic hypotension   . Shortness of breath     with exertion  . Arthritis     finger     Past Surgical History  Procedure Laterality Date  . Partial hysterectomy    . Neck surgery      secondary to ruptured disc  . Colonoscopy  12/25/2010    Procedure: COLONOSCOPY;  Surgeon: Malissa Hippo, MD;  Location: [facility];  Service: Endoscopy;  Laterality: N/A;  . Cardiac catheterization      x 2 at [facility] "nothing showed up"  . Abdominal hysterectomy    . Wrist fusion      Right- torn ligament  . Posterior cervical fusion/foraminotomy  12/18/2011    Procedure: POSTERIOR CERVICAL FUSION/FORAMINOTOMY LEVEL 2;  Surgeon: Hewitt Shorts, MD;  Location: [facility];  Service: Neurosurgery;  Laterality: Bilateral;  Cervical four to six Posterior cervical arthrodesis     Family History  Problem Relation Age of Onset  . Colon cancer Mother     History  Substance Use Topics  . Smoking status: Never Smoker   . Smokeless tobacco: Not on file  . Alcohol Use: No    OB History   Grav Para Term Preterm Abortions TAB SAB Ect Mult Living                  Review of Systems  All other systems reviewed and are negative.    Allergies  Codeine; Other; and Latex  Home Medications   Current Outpatient Rx  Name  Route  Sig  Dispense  Refill  . amLODipine (NORVASC) 5 MG tablet   Oral   Take 5 mg by mouth daily.          Marland Kitchen aspirin 81 MG tablet   Oral   Take 81 mg by mouth daily.           . calcium-vitamin D (OSCAL WITH D) 500-200 MG-UNIT per tablet   Oral   Take 1 tablet by mouth daily.           Marland Kitchen levothyroxine (SYNTHROID, LEVOTHROID) 75 MCG tablet   Oral   Take 75 mcg by mouth daily.           Marland Kitchen  multivitamin-iron-minerals-folic acid (CENTRUM) chewable tablet   Oral   Chew 1 tablet by mouth daily.           . naproxen sodium (ANAPROX) 220 MG tablet   Oral   Take 440 mg by mouth 2 (two) times daily as needed. For pain         . pravastatin (PRAVACHOL) 20 MG tablet   Oral   Take 20 mg by mouth daily.         Marland Kitchen VIVELLE-DOT 0.05 MG/24HR   Transdermal   Place 1 patch onto the skin once a week.          Marland Kitchen HYDROcodone-acetaminophen (NORCO/VICODIN) 5-325 MG per tablet   Oral   Take 1 tablet by mouth every 4 (four) hours as needed for pain.   10 tablet   0     BP 187/83  Pulse 83  Temp(Src) 97.6 F (36.4 C) (Oral)  Resp 20  Ht 5\' 5"  (1.651 m)  Wt 150 lb (68.04 kg)  BMI 24.96 kg/m2  SpO2 99%  Physical Exam  Nursing note and vitals reviewed. Constitutional: She is oriented to person, place, and time. She appears well-developed and well-nourished. No distress.  HENT:  Head: Normocephalic and atraumatic.  Eyes: EOM are normal.  Neck: Normal range of motion.  Cardiovascular: Normal rate.   Pulmonary/Chest:  Effort normal.  Abdominal: Soft.  Musculoskeletal: Normal range of motion.  Hematoma of the dorsal surface of her left hand.  Tenderness in this area about the second third and fourth metacarpals.  Normal perfusion all 5 fingers.  Normal flexion and extension function of all 5 fingers.  Normal left radial and left ulnar pulse.  Sensory intact to all fingers and distal to injury.  Neurological: She is alert and oriented to person, place, and time.  Skin: Skin is warm and dry.  Psychiatric: She has a normal mood and affect. Judgment normal.    ED Course  Procedures (including critical care time)  Labs Reviewed - No data to display Dg Hand Complete Left  08/05/2012  *RADIOLOGY REPORT*  Clinical Data: Hand pain and swelling following injury today.  LEFT HAND - COMPLETE 3+ VIEW  Comparison: None.  Findings: There is moderate soft tissue swelling dorsally at the base of the hand.  No foreign body is identified. The mineralization and alignment are normal.  There is no evidence of acute fracture or dislocation.  Moderate interphalangeal degenerative changes are noted.  IMPRESSION: Soft tissue swelling and interphalangeal degenerative changes.  No acute osseous findings.   Original Report Authenticated By: Carey Bullocks, M.D.   I personally reviewed the imaging tests through PACS system I reviewed available ER/hospitalization records through the EMR    1. Traumatic hematoma of hand, left, initial encounter       MDM  Hematoma of left hand overlying abrasion.  Nothing to repair this time.  Instructions for hematoma care provided.  She understands return for signs of infection or any other new or worsening symptoms.        Lyanne Co, MD 08/05/12 [phone]

## 2012-08-05 NOTE — ED Notes (Signed)
Had pt remove rings from left hand & pt had them placed in purse by friend in room w/ her.

## 2012-09-28 ENCOUNTER — Other Ambulatory Visit: Payer: Self-pay | Admitting: Adult Health

## 2012-09-28 DIAGNOSIS — Z139 Encounter for screening, unspecified: Secondary | ICD-10-CM

## 2012-10-18 ENCOUNTER — Ambulatory Visit (HOSPITAL_COMMUNITY)
Admission: RE | Admit: 2012-10-18 | Discharge: 2012-10-18 | Disposition: A | Discharge status: Home / Self Care | Payer: BC Managed Care – PPO | Source: Ambulatory Visit | Attending: Adult Health | Admitting: Adult Health

## 2012-10-18 DIAGNOSIS — Z1231 Encounter for screening mammogram for malignant neoplasm of breast: Secondary | ICD-10-CM | POA: Insufficient documentation

## 2012-10-18 DIAGNOSIS — Z139 Encounter for screening, unspecified: Secondary | ICD-10-CM

## 2013-03-14 ENCOUNTER — Other Ambulatory Visit: Payer: Self-pay | Admitting: Adult Health

## 2013-03-25 ENCOUNTER — Encounter: Payer: Self-pay | Admitting: Adult Health

## 2013-03-25 ENCOUNTER — Ambulatory Visit (INDEPENDENT_AMBULATORY_CARE_PROVIDER_SITE_OTHER): Payer: BC Managed Care – PPO | Admitting: Adult Health

## 2013-03-25 VITALS — BP 148/70 | HR 72 | Ht 64.0 in | Wt 151.5 lb

## 2013-03-25 DIAGNOSIS — Z01419 Encounter for gynecological examination (general) (routine) without abnormal findings: Secondary | ICD-10-CM

## 2013-03-25 DIAGNOSIS — Z1212 Encounter for screening for malignant neoplasm of rectum: Secondary | ICD-10-CM

## 2013-03-25 DIAGNOSIS — Z79899 Other long term (current) drug therapy: Secondary | ICD-10-CM

## 2013-03-25 DIAGNOSIS — Z79818 Long term (current) use of other agents affecting estrogen receptors and estrogen levels: Secondary | ICD-10-CM

## 2013-03-25 HISTORY — DX: Long term (current) use of other agents affecting estrogen receptors and estrogen levels: Z79.818

## 2013-03-25 HISTORY — DX: Other long term (current) drug therapy: Z79.899

## 2013-03-25 LAB — HEMOCCULT GUIAC POC 1CARD (OFFICE)

## 2013-03-25 NOTE — Progress Notes (Signed)
Patient ID: [name], female   DOB: Dec 16, 1945, 67 y.o.   MRN: [id number] History of Present Illness: [name] is a 67 year old white female, married in for a physical.No complaints.Happy with patches and wants to continue using them.   Current Medications, Allergies, Past Medical History, Past Surgical History, Family History and Social History were reviewed in [employer] record.   Past Medical History  Diagnosis Date  . Hypertension   . Hyperlipidemia   . Hypothyroidism   . GERD (gastroesophageal reflux disease)   . PONV (postoperative nausea and vomiting)     Did not have this problem- 2010  . Orthostatic hypotension   . Shortness of breath     with exertion  . Arthritis     finger   . Current use of estrogen therapy 03/25/2013   Past Surgical History  Procedure Laterality Date  . Partial hysterectomy    . Neck surgery      secondary to ruptured disc  . Colonoscopy  12/25/2010    Procedure: COLONOSCOPY;  Surgeon: Malissa Hippo, MD;  [facility]: [facility];  Service: Endoscopy;  Laterality: N/A;  . Cardiac catheterization      x 2 at [facility] "nothing showed up"  . Abdominal hysterectomy    . Wrist fusion      Right- torn ligament  . Posterior cervical fusion/foraminotomy  12/18/2011    Procedure: POSTERIOR CERVICAL FUSION/FORAMINOTOMY LEVEL 2;  Surgeon: Hewitt Shorts, MD;  Location: [facility];  Service: Neurosurgery;  Laterality: Bilateral;  Cervical four to six Posterior cervical arthrodesis  Current outpatient prescriptions:amLODipine (NORVASC) 5 MG tablet, Take 5 mg by mouth daily. , Disp: , Rfl: ;  aspirin 81 MG tablet, Take 81 mg by mouth daily.  , Disp: , Rfl: ;  calcium-vitamin D (OSCAL WITH D) 500-200 MG-UNIT per tablet, Take 1 tablet by mouth daily.  , Disp: , Rfl: ;  HYDROcodone-acetaminophen (NORCO/VICODIN) 5-325 MG per tablet, Take 1 tablet by mouth every 4 (four) hours as needed for pain., Disp: 10 tablet, Rfl: 0 levothyroxine  (SYNTHROID, LEVOTHROID) 75 MCG tablet, Take 75 mcg by mouth daily.  , Disp: , Rfl: ;  multivitamin-iron-minerals-folic acid (CENTRUM) chewable tablet, Chew 1 tablet by mouth daily.  , Disp: , Rfl: ;  naproxen sodium (ANAPROX) 220 MG tablet, Take 440 mg by mouth 2 (two) times daily as needed. For pain, Disp: , Rfl: ;  pravastatin (PRAVACHOL) 20 MG tablet, Take 20 mg by mouth daily., Disp: , Rfl:  VIVELLE-DOT 0.05 MG/24HR, Place 1 patch onto the skin once a week. , Disp: , Rfl:   Review of Systems: Patient denies any headaches, blurred vision, shortness of breath, chest pain, abdominal pain, problems with bowel movements, urination, or intercourse, not having sex.No joint pain or mood swings.Works 39.5 hours at [employer].[place]usaband has MS.     Physical Exam:BP 148/70  Pulse 72  Ht 5\' 4"  (1.626 m)  Wt 151 lb 8 oz (68.72 kg)  BMI 25.99 kg/m2 General:  Well developed, well nourished, no acute distress Skin:  Warm and dry Neck:  Midline trachea, normal thyroid, no carotid bruits heard Lungs; Clear to auscultation bilaterally Breast:  No dominant palpable mass, retraction, or nipple discharge Cardiovascular: Regular rate and rhythm Abdomen:  Soft, non tender, no hepatosplenomegaly Pelvic:  External genitalia is normal in appearance.  The vagina is normal in appearance for age, the cervix and uterus are absent. No   adnexal masses or tenderness noted. Rectal:  Good sphincter tone, no polyps, or hemorrhoids felt.  Hemoccult negative. Extremities:  No swelling or varicosities noted Psych:  No mood changes, alert and cooperative, seems happy   Impression: Yearly gyn exam no pap Estrogen therapy    Plan: Physical in 1 year Mammogram yearly  Colonoscopy per GI Declines labs Continue patches, call when needs refills

## 2013-03-25 NOTE — Patient Instructions (Signed)
Physical in 1 year Mammogram yearly Colonoscopy as per GI 

## 2013-11-30 ENCOUNTER — Other Ambulatory Visit: Payer: Self-pay | Admitting: Adult Health

## 2013-11-30 DIAGNOSIS — Z1231 Encounter for screening mammogram for malignant neoplasm of breast: Secondary | ICD-10-CM

## 2013-12-05 ENCOUNTER — Observation Stay (HOSPITAL_COMMUNITY)
Admission: AD | Admit: 2013-12-05 | Discharge: 2013-12-07 | Disposition: A | Discharge status: Home / Self Care | Payer: BC Managed Care – PPO | Source: Ambulatory Visit | Attending: Pulmonary Disease | Admitting: Pulmonary Disease

## 2013-12-05 ENCOUNTER — Observation Stay (HOSPITAL_COMMUNITY): Payer: BC Managed Care – PPO

## 2013-12-05 ENCOUNTER — Encounter (HOSPITAL_COMMUNITY): Payer: Self-pay

## 2013-12-05 DIAGNOSIS — I951 Orthostatic hypotension: Secondary | ICD-10-CM | POA: Diagnosis not present

## 2013-12-05 DIAGNOSIS — E785 Hyperlipidemia, unspecified: Secondary | ICD-10-CM | POA: Insufficient documentation

## 2013-12-05 DIAGNOSIS — G459 Transient cerebral ischemic attack, unspecified: Secondary | ICD-10-CM | POA: Diagnosis not present

## 2013-12-05 DIAGNOSIS — R5383 Other fatigue: Secondary | ICD-10-CM | POA: Diagnosis present

## 2013-12-05 DIAGNOSIS — R5381 Other malaise: Secondary | ICD-10-CM | POA: Insufficient documentation

## 2013-12-05 DIAGNOSIS — I10 Essential (primary) hypertension: Secondary | ICD-10-CM | POA: Insufficient documentation

## 2013-12-05 DIAGNOSIS — M129 Arthropathy, unspecified: Secondary | ICD-10-CM | POA: Diagnosis not present

## 2013-12-05 DIAGNOSIS — K219 Gastro-esophageal reflux disease without esophagitis: Secondary | ICD-10-CM | POA: Diagnosis not present

## 2013-12-05 DIAGNOSIS — E039 Hypothyroidism, unspecified: Secondary | ICD-10-CM | POA: Insufficient documentation

## 2013-12-05 DIAGNOSIS — Z79899 Other long term (current) drug therapy: Secondary | ICD-10-CM | POA: Insufficient documentation

## 2013-12-05 DIAGNOSIS — Z7982 Long term (current) use of aspirin: Secondary | ICD-10-CM | POA: Insufficient documentation

## 2013-12-05 DIAGNOSIS — Z9889 Other specified postprocedural states: Secondary | ICD-10-CM | POA: Diagnosis not present

## 2013-12-05 DIAGNOSIS — Z91040 Latex allergy status: Secondary | ICD-10-CM | POA: Insufficient documentation

## 2013-12-05 LAB — CBC WITH DIFFERENTIAL/PLATELET
Basophils Absolute: 0 10*3/uL (ref 0.0–0.1)
Basophils Relative: 1 % (ref 0–1)
Eosinophils Absolute: 0.2 10*3/uL (ref 0.0–0.7)
Eosinophils Relative: 3 % (ref 0–5)
HCT: 38.6 % (ref 36.0–46.0)
HEMOGLOBIN: 13.5 g/dL (ref 12.0–15.0)
LYMPHS ABS: 2.1 10*3/uL (ref 0.7–4.0)
Lymphocytes Relative: 35 % (ref 12–46)
MCH: 31.2 pg (ref 26.0–34.0)
MCHC: 35 g/dL (ref 30.0–36.0)
MCV: 89.1 fL (ref 78.0–100.0)
MONOS PCT: 10 % (ref 3–12)
Monocytes Absolute: 0.6 10*3/uL (ref 0.1–1.0)
NEUTROS ABS: 3.1 10*3/uL (ref 1.7–7.7)
NEUTROS PCT: 52 % (ref 43–77)
Platelets: 215 10*3/uL (ref 150–400)
RBC: 4.33 MIL/uL (ref 3.87–5.11)
RDW: 12.8 % (ref 11.5–15.5)
WBC: 6 10*3/uL (ref 4.0–10.5)

## 2013-12-05 LAB — COMPREHENSIVE METABOLIC PANEL
ALT: 19 U/L (ref 0–35)
ANION GAP: 12 (ref 5–15)
AST: 27 U/L (ref 0–37)
Albumin: 4.3 g/dL (ref 3.5–5.2)
Alkaline Phosphatase: 89 U/L (ref 39–117)
BUN: 22 mg/dL (ref 6–23)
CALCIUM: 9.4 mg/dL (ref 8.4–10.5)
CO2: 28 mEq/L (ref 19–32)
Chloride: 102 mEq/L (ref 96–112)
Creatinine, Ser: 0.83 mg/dL (ref 0.50–1.10)
GFR calc non Af Amer: 72 mL/min — ABNORMAL LOW (ref >90)
GFR, EST AFRICAN AMERICAN: 83 mL/min — AB (ref >90)
GLUCOSE: 102 mg/dL — AB (ref 70–99)
Potassium: 4.5 mEq/L (ref 3.7–5.3)
SODIUM: 142 meq/L (ref 137–147)
Total Bilirubin: 0.3 mg/dL (ref 0.3–1.2)
Total Protein: 7.6 g/dL (ref 6.0–8.3)

## 2013-12-05 MED ORDER — ENOXAPARIN SODIUM 40 MG/0.4ML ~~LOC~~ SOLN
40.0000 mg | SUBCUTANEOUS | Status: DC
  Administered 2013-12-05 – 2013-12-06 (×2): 40 mg via SUBCUTANEOUS
  Filled 2013-12-05 (×2): qty 0.4

## 2013-12-05 MED ORDER — ACETAMINOPHEN 650 MG RE SUPP: 650.0000 mg | Freq: Four times a day (QID) | RECTAL | Status: DC | PRN

## 2013-12-05 MED ORDER — LEVOTHYROXINE SODIUM 75 MCG PO TABS
75.0000 ug | ORAL_TABLET | Freq: Every day | ORAL | Status: DC
  Administered 2013-12-06 – 2013-12-07 (×2): 75 ug via ORAL
  Filled 2013-12-05 (×2): qty 1

## 2013-12-05 MED ORDER — LORAZEPAM 2 MG/ML IJ SOLN
1.0000 mg | Freq: Once | INTRAMUSCULAR | Status: AC
  Administered 2013-12-05: 1 mg via INTRAVENOUS
  Filled 2013-12-05: qty 1

## 2013-12-05 MED ORDER — SODIUM CHLORIDE 0.9 % IJ SOLN: 3.0000 mL | INTRAMUSCULAR | Status: DC | PRN

## 2013-12-05 MED ORDER — CLOPIDOGREL BISULFATE 75 MG PO TABS
75.0000 mg | ORAL_TABLET | Freq: Every day | ORAL | Status: DC
  Administered 2013-12-05 – 2013-12-07 (×3): 75 mg via ORAL
  Filled 2013-12-05 (×3): qty 1

## 2013-12-05 MED ORDER — SIMVASTATIN 10 MG PO TABS
10.0000 mg | ORAL_TABLET | Freq: Every day | ORAL | Status: DC
  Administered 2013-12-05 – 2013-12-06 (×2): 10 mg via ORAL
  Filled 2013-12-05 (×2): qty 1

## 2013-12-05 MED ORDER — SODIUM CHLORIDE 0.9 % IJ SOLN
3.0000 mL | Freq: Two times a day (BID) | INTRAMUSCULAR | Status: DC
  Administered 2013-12-06 – 2013-12-07 (×2): 3 mL via INTRAVENOUS

## 2013-12-05 MED ORDER — SODIUM CHLORIDE 0.9 % IJ SOLN
3.0000 mL | Freq: Two times a day (BID) | INTRAMUSCULAR | Status: DC
  Administered 2013-12-05 – 2013-12-06 (×2): 3 mL via INTRAVENOUS

## 2013-12-05 MED ORDER — ONDANSETRON HCL 4 MG/2ML IJ SOLN: 4.0000 mg | Freq: Four times a day (QID) | INTRAMUSCULAR | Status: DC | PRN

## 2013-12-05 MED ORDER — TRAZODONE HCL 50 MG PO TABS
25.0000 mg | ORAL_TABLET | Freq: Every evening | ORAL | Status: DC | PRN
  Administered 2013-12-06: 25 mg via ORAL
  Filled 2013-12-05: qty 1

## 2013-12-05 MED ORDER — AMLODIPINE BESYLATE 5 MG PO TABS
5.0000 mg | ORAL_TABLET | Freq: Every day | ORAL | Status: DC
  Administered 2013-12-05 – 2013-12-07 (×3): 5 mg via ORAL
  Filled 2013-12-05 (×3): qty 1

## 2013-12-05 MED ORDER — ONDANSETRON HCL 4 MG PO TABS: 4.0000 mg | ORAL_TABLET | Freq: Four times a day (QID) | ORAL | Status: DC | PRN

## 2013-12-05 MED ORDER — ACETAMINOPHEN 325 MG PO TABS
650.0000 mg | ORAL_TABLET | Freq: Four times a day (QID) | ORAL | Status: DC | PRN
  Administered 2013-12-06: 650 mg via ORAL
  Filled 2013-12-05: qty 2

## 2013-12-05 MED ORDER — SODIUM CHLORIDE 0.9 % IV SOLN: 250.0000 mL | INTRAVENOUS | Status: DC | PRN

## 2013-12-05 MED ORDER — HYDROCODONE-ACETAMINOPHEN 5-325 MG PO TABS
1.0000 | ORAL_TABLET | ORAL | Status: DC | PRN
  Filled 2013-12-05: qty 1

## 2013-12-05 MED ORDER — ASPIRIN 81 MG PO CHEW
81.0000 mg | CHEWABLE_TABLET | Freq: Every day | ORAL | Status: DC
  Administered 2013-12-05 – 2013-12-07 (×3): 81 mg via ORAL
  Filled 2013-12-05 (×3): qty 1

## 2013-12-05 NOTE — Progress Notes (Addendum)
Dr. Juanetta GoslingHawkins called and ordered Ativan IV 1mg  once prior to MRI. Patient's registered nurse, Molli HazardMatthew notified.

## 2013-12-06 ENCOUNTER — Observation Stay (HOSPITAL_COMMUNITY): Payer: BC Managed Care – PPO

## 2013-12-06 DIAGNOSIS — I319 Disease of pericardium, unspecified: Secondary | ICD-10-CM

## 2013-12-06 DIAGNOSIS — G459 Transient cerebral ischemic attack, unspecified: Secondary | ICD-10-CM | POA: Diagnosis not present

## 2013-12-06 LAB — TSH: TSH: 4.15 u[IU]/mL (ref 0.350–4.500)

## 2013-12-06 MED ORDER — LORAZEPAM 2 MG/ML IJ SOLN: 1.0000 mg | Freq: Once | INTRAMUSCULAR | Status: DC

## 2013-12-06 MED ORDER — STROKE: EARLY STAGES OF RECOVERY BOOK
Freq: Once | Status: AC
  Administered 2013-12-06: 1
  Filled 2013-12-06: qty 1

## 2013-12-06 NOTE — H&P (Signed)
[name] MRN: [id number] DOB/AGE: 1945/10/14 68 y.o. Primary Care Physician:Rashed Edler L, MD Admit date: 12/05/2013 Chief Complaint: Weakness HPI: This is documentation of history and physical performed in my office yesterday. She came to my office for a work in visit after having had 4 episodes of weakness which was mostly on the right side. She had some sensation of numbness on the right side as well. She had some problem with speech and with feeling that her tongue was not working properly. These episodes were over a proximally 36 hour period. She also says that she thinks her blood pressure has dropped. She does have a history of orthostatic hypotension in the past. She has had surgery on her neck for ruptured disc and has been having some neck and shoulder pain as well  Past Medical History  Diagnosis Date  . Hypertension   . Hyperlipidemia   . Hypothyroidism   . GERD (gastroesophageal reflux disease)   . PONV (postoperative nausea and vomiting)     Did not have this problem- 2010  . Orthostatic hypotension   . Shortness of breath     with exertion  . Arthritis     finger   . Current use of estrogen therapy 03/25/2013   Past Surgical History  Procedure Laterality Date  . Partial hysterectomy    . Neck surgery      secondary to ruptured disc  . Colonoscopy  12/25/2010    Procedure: COLONOSCOPY;  Surgeon: Malissa HippoNajeeb U Rehman, MD;  Location: [facility];  Service: Endoscopy;  Laterality: N/A;  . [facility]      [facility] showed up"  . Abdominal hysterectomy    . Wrist fusion      Right- torn ligament  . Posterior cervical fusion/foraminotomy  12/18/2011    Procedure: POSTERIOR CERVICAL FUSION/FORAMINOTOMY LEVEL 2;  Surgeon: Hewitt Shortsobert W Nudelman, MD;  Location: [facility];  Service: Neurosurgery;  Laterality: Bilateral;  Cervical four to six Posterior cervical arthrodesis        Family History  Problem Relation Age of Onset  . Colon  cancer Mother   . Cancer Mother     lung  . Diabetes Father   . Cancer Brother     back    Social History:  reports that she has never smoked. She has never used smokeless tobacco. She reports that she does not drink alcohol or use illicit drugs.   Allergies:  Allergies  Allergen Reactions  . Codeine Nausea And Vomiting  . Other Nausea And Vomiting    Most anesthesia   . Latex Rash    Medications Prior to Admission  Medication Sig Dispense Refill  . amLODipine (NORVASC) 5 MG tablet Take 5 mg by mouth every evening.       Marland Kitchen. aspirin 81 MG tablet Take 81 mg by mouth every evening.       . calcium-vitamin D (OSCAL WITH D) 500-200 MG-UNIT per tablet Take 1 tablet by mouth every evening.       Marland Kitchen. levothyroxine (SYNTHROID, LEVOTHROID) 75 MCG tablet Take 75 mcg by mouth daily.        . multivitamin-iron-minerals-folic acid (CENTRUM) chewable tablet Chew 1 tablet by mouth every evening.       . naproxen sodium (ANAPROX) 220 MG tablet Take 440 mg by mouth 2 (two) times daily as needed. For pain      . HYDROcodone-acetaminophen (NORCO/VICODIN) 5-325 MG per tablet Take 1 tablet by mouth every 4 (four)  hours as needed for pain.  10 tablet  0  . VIVELLE-DOT 0.05 MG/24HR Place 1 patch onto the skin once a week.            ZOX:WRUEA from the symptoms mentioned above,there are no other symptoms referable to all systems reviewed.  Physical Exam: Blood pressure 160/62, pulse 72, temperature 98 F (36.7 C), temperature source Oral, resp. rate 18, height 5\' 5"  (1.651 m), weight 68.72 kg (151 lb 8 oz), SpO2 97.00%. She is awake and alert. Her speech is not normal. She sounds like she has difficulty with manipulating her tongue. Her tongue protrudes midline. Her nose and throat are clear. Her neck is supple without masses. She does not have any bruits. Her face is symmetrical. Her heart is regular without murmur gallop or rub. Her abdomen is soft without masses and bowel sounds are present and  active. Her chest is clear.  Extremities showed no clubbing cyanosis or edema. Her central nervous system examination shows that the strength appears to be essentially equal bilaterally. She does not have any focal abnormalities now  Recent Labs  12/05/13 1813  WBC 6.0  NEUTROABS 3.1  HGB 13.5  HCT 38.6  MCV 89.1  PLT 215    Recent Labs  12/05/13 1813  NA 142  K 4.5  CL 102  CO2 28  GLUCOSE 102*  BUN 22  CREATININE 0.83  CALCIUM 9.4  lablast2(ast:2,ALT:2,alkphos:2,bilitot:2,prot:2,albumin:2)@    No results found for this or any previous visit (from the past 240 hour(s)).   Mr Brain Wo Contrast  12/05/2013   CLINICAL DATA:  TIA  EXAM: MRI HEAD WITHOUT CONTRAST  TECHNIQUE: Multiplanar, multiecho pulse sequences of the brain and surrounding structures were obtained without intravenous contrast.  COMPARISON:  MRI 10/01/2008  FINDINGS: Scattered small subcortical white matter hyperintensities show mild progression since 2010. Hyperintensity in the central midbrain unchanged. These findings are most consistent with chronic microvascular ischemia.  Negative for acute infarct.  Negative for hemorrhage or mass.  Ventricle size is normal.  Cerebral volume is normal.  Paranasal sinuses are clear.  IMPRESSION: Mild chronic microvascular ischemic change in the white matter and midbrain. No acute abnormality.   Electronically Signed   By: Marlan Palau M.D.   On: 12/05/2013 20:30   Impression: I'm concerned that she may have had TIA. She's going to be placed in observation and have MRI of the brain, echocardiogram, carotid Doppler study, speech and physical therapy evaluation and neurology consultation. Active Problems:   TIA (transient ischemic attack)     Plan: As above      [name]   12/06/2013, 8:33 AM

## 2013-12-06 NOTE — Consult Note (Signed)
Elroy A. Merlene Laughter, MD     [email or web address]          [name] is an 68 y.o. female.   ASSESSMENT/PLAN: 1. TRANSIENT NUMBNESS AND WEAKNESS INVOLVING THE RIGHT SIDE ASSOCIATED WITH MILD DYSARTHRIA. THE DIFFERENTIAL DIAGNOSIS INCLUDES TRANSIENT ISCHEMIC ATTACK, COMPLEX PARTIAL SEIZURES, MIGRAINE EQUIVALENT AND PSYCHOSOMATIC DISORDERS.   - The brain MRA will be obtained. EEG also be obtained. The patient Should continue antiplatelet agents. For now, she is on dual antiplatelet agents which is appropriate in the short-term. I was switched to a single agent after 3 months. Also continue with statin medication and blood pressure control.    This is a 32-year-old white female who presents with the acute onset of episodic numbness of the right upper and lower extremity. She reports that the symptoms only lasted for about a minute. She had a total of 4 events. 2 happened on yesterday and  Sunday. It appears that the episodes were associated with some dysarthria per her primary care provider although the patient cannot recall this. She does not report having headaches. There is no history of episodic headaches per se or migraine. She does report having dizziness with a spell described as lightheadedness. She also reports having some dyspnea. There is no chest pain, loss of consciousness, altered mental status, GI GU symptoms. The review of systems otherwise negative. The workup has mostly been unremarkable including MRI.  GENERAL: This is a pleasant thin female in no acute distress.  HEENT: Supple. Atraumatic normocephalic.   ABDOMEN: soft  EXTREMITIES: No edema   BACK: Normal.  SKIN: Normal by inspection.    MENTAL STATUS: Alert and oriented. Speech, language and cognition are generally intact. Judgment and insight normal.   CRANIAL NERVES: Pupils are equal, round and reactive to light and accommodation; extra ocular movements are full, there is no significant  nystagmus; visual fields are full; upper and lower facial muscles are normal in strength and symmetric, there is no flattening of the nasolabial folds; tongue is midline; uvula is midline; shoulder elevation is normal.  MOTOR: Normal tone, bulk and strength; no pronator drift.  COORDINATION: Left finger to nose is normal, right finger to nose is normal, No rest tremor; no intention tremor; no postural tremor; no bradykinesia.  REFLEXES: Deep tendon reflexes are symmetrical and normal. Babinski reflexes are flexor bilaterally.   SENSATION: Normal to light touch.  GAIT:Normal.    Past Medical History  Diagnosis Date  . Hypertension   . Hyperlipidemia   . Hypothyroidism   . GERD (gastroesophageal reflux disease)   . PONV (postoperative nausea and vomiting)     Did not have this problem- 2010  . Orthostatic hypotension   . Shortness of breath     with exertion  . Arthritis     finger   . Current use of estrogen therapy 03/25/2013    Past Surgical History  Procedure Laterality Date  . Partial hysterectomy    . Neck surgery      secondary to ruptured disc  . Colonoscopy  12/25/2010    Procedure: COLONOSCOPY;  Surgeon: Rogene Houston, MD;  [facility];  Service: Endoscopy;  Laterality: N/A;  . Cardiac catheterization      x 2 at [facility] "nothing showed up"  . Abdominal hysterectomy    . Wrist fusion      Right- torn ligament  . Posterior cervical fusion/foraminotomy  12/18/2011    Procedure: POSTERIOR CERVICAL FUSION/FORAMINOTOMY LEVEL 2;  Surgeon: Herbie Baltimore  Virgie Dad, MD;  Location: [place] [facility];  Service: Neurosurgery;  Laterality: Bilateral;  Cervical four to six Posterior cervical arthrodesis    Family History  Problem Relation Age of Onset  . Colon cancer Mother   . Cancer Mother     lung  . Diabetes Father   . Cancer Brother     back    Social History:  reports that she has never smoked. She has never used smokeless tobacco. She reports that she does  not drink alcohol or use illicit drugs.  Allergies:  Allergies  Allergen Reactions  . Codeine Nausea And Vomiting  . Other Nausea And Vomiting    Most anesthesia   . Latex Rash    Medications: Prior to Admission medications   Medication Sig Start Date End Date Taking? Authorizing Provider  amLODipine (NORVASC) 5 MG tablet Take 5 mg by mouth every evening.  01/11/11  Yes Historical Provider, MD  aspirin 81 MG tablet Take 81 mg by mouth every evening.    Yes Historical Provider, MD  calcium-vitamin D (OSCAL WITH D) 500-200 MG-UNIT per tablet Take 1 tablet by mouth every evening.    Yes Historical Provider, MD  levothyroxine (SYNTHROID, LEVOTHROID) 75 MCG tablet Take 75 mcg by mouth daily.     Yes Historical Provider, MD  multivitamin-iron-minerals-folic acid (CENTRUM) chewable tablet Chew 1 tablet by mouth every evening.    Yes Historical Provider, MD  naproxen sodium (ANAPROX) 220 MG tablet Take 440 mg by mouth 2 (two) times daily as needed. For pain   Yes Historical Provider, MD  HYDROcodone-acetaminophen (NORCO/VICODIN) 5-325 MG per tablet Take 1 tablet by mouth every 4 (four) hours as needed for pain. 08/05/12   Hoy Morn, MD  VIVELLE-DOT 0.05 MG/24HR Place 1 patch onto the skin once a week.  04/24/10   Historical Provider, MD    Scheduled Meds: . amLODipine  5 mg Oral Daily  . aspirin  81 mg Oral Daily  . clopidogrel  75 mg Oral Daily  . enoxaparin (LOVENOX) injection  40 mg Subcutaneous Q24H  . levothyroxine  75 mcg Oral QAC breakfast  . simvastatin  10 mg Oral q1800  . sodium chloride  3 mL Intravenous Q12H  . sodium chloride  3 mL Intravenous Q12H   Continuous Infusions:  PRN Meds:.sodium chloride, acetaminophen, acetaminophen, HYDROcodone-acetaminophen, ondansetron (ZOFRAN) IV, ondansetron, sodium chloride, traZODone   Blood pressure 147/59, pulse 72, temperature 97.9 F (36.6 C), temperature source Oral, resp. rate 17, height _0  (1.651 m), weight 68.72 kg (151 lb 8  oz), SpO2 98.00%.   Results for orders placed during the hospital encounter of 12/05/13 (from the past 48 hour(s))  COMPREHENSIVE METABOLIC PANEL     Status: Abnormal   Collection Time    12/05/13  6:13 PM      Result Value Ref Range   Sodium 142  137 - 147 mEq/L   Potassium 4.5  3.7 - 5.3 mEq/L   Chloride 102  96 - 112 mEq/L   CO2 28  19 - 32 mEq/L   Glucose, Bld 102 (*) 70 - 99 mg/dL   BUN 22  6 - 23 mg/dL   Creatinine, Ser 0.83  0.50 - 1.10 mg/dL   Calcium 9.4  8.4 - 10.5 mg/dL   Total Protein 7.6  6.0 - 8.3 g/dL   Albumin 4.3  3.5 - 5.2 g/dL   AST 27  0 - 37 U/L   ALT 19  0 - 35 U/L  Alkaline Phosphatase 89  39 - 117 U/L   Total Bilirubin 0.3  0.3 - 1.2 mg/dL   GFR calc non Af Amer 72 (*) >90 mL/min   GFR calc Af Amer 83 (*) >90 mL/min   Comment: (NOTE)     The eGFR has been calculated using the CKD EPI equation.     This calculation has not been validated in all clinical situations.     eGFR's persistently <90 mL/min signify possible Chronic Kidney     Disease.   Anion gap 12  5 - 15  CBC WITH DIFFERENTIAL     Status: None   Collection Time    12/05/13  6:13 PM      Result Value Ref Range   WBC 6.0  4.0 - 10.5 K/uL   RBC 4.33  3.87 - 5.11 MIL/uL   Hemoglobin 13.5  12.0 - 15.0 g/dL   HCT 38.6  36.0 - 46.0 %   MCV 89.1  78.0 - 100.0 fL   MCH 31.2  26.0 - 34.0 pg   MCHC 35.0  30.0 - 36.0 g/dL   RDW 12.8  11.5 - 15.5 %   Platelets 215  150 - 400 K/uL   Neutrophils Relative % 52  43 - 77 %   Neutro Abs 3.1  1.7 - 7.7 K/uL   Lymphocytes Relative 35  12 - 46 %   Lymphs Abs 2.1  0.7 - 4.0 K/uL   Monocytes Relative 10  3 - 12 %   Monocytes Absolute 0.6  0.1 - 1.0 K/uL   Eosinophils Relative 3  0 - 5 %   Eosinophils Absolute 0.2  0.0 - 0.7 K/uL   Basophils Relative 1  0 - 1 %   Basophils Absolute 0.0  0.0 - 0.1 K/uL  TSH     Status: None   Collection Time    12/05/13  6:13 PM      Result Value Ref Range   TSH 4.150  0.350 - 4.500 uIU/mL   Comment: Performed at  [facility]    Dg Cervical Spine Complete  12/06/2013   CLINICAL DATA:  Neck pain.  EXAM: CERVICAL SPINE  4+ VIEWS  COMPARISON:  April 30, 2012.  FINDINGS: Status post surgical anterior and posterior fusion of C4, C5 and C6. Good alignment of the vertebral bodies is noted. No fracture or spondylolisthesis is noted. No significant neuroforaminal stenosis is noted bilaterally.  IMPRESSION: Status post surgical fusion of C4, C5 and C6. No other abnormality seen in the cervical spine.   Electronically Signed   By: Sabino Dick M.D.   On: 12/06/2013 08:53   Dg Shoulder Right  12/06/2013   CLINICAL DATA:  Neck and BILATERAL shoulder pain for years, no known injury  EXAM: RIGHT SHOULDER - 2+ VIEW  COMPARISON:  None  FINDINGS: Osseous demineralization.  AC joint alignment normal.  No acute fracture, dislocation, or bone destruction.  Visualized RIGHT ribs intact.  IMPRESSION: No acute osseous abnormalities.   Electronically Signed   By: Lavonia Dana M.D.   On: 12/06/2013 08:52   Mr Brain Wo Contrast  12/05/2013   CLINICAL DATA:  TIA  EXAM: MRI HEAD WITHOUT CONTRAST  TECHNIQUE: Multiplanar, multiecho pulse sequences of the brain and surrounding structures were obtained without intravenous contrast.  COMPARISON:  MRI 10/01/2008  FINDINGS: Scattered small subcortical white matter hyperintensities show mild progression since 2010. Hyperintensity in the central midbrain unchanged. These findings are most consistent with chronic microvascular  ischemia.  Negative for acute infarct.  Negative for hemorrhage or mass.  Ventricle size is normal.  Cerebral volume is normal.  Paranasal sinuses are clear.  IMPRESSION: Mild chronic microvascular ischemic change in the white matter and midbrain. No acute abnormality.   Electronically Signed   By: Franchot Gallo M.D.   On: 12/05/2013 20:30   US Carotid Bilateral  12/06/2013   CLINICAL DATA:  TIA  EXAM: BILATERAL CAROTID DUPLEX ULTRASOUND  TECHNIQUE: Pearline Cables scale imaging,  color Doppler and duplex ultrasound were performed of bilateral carotid and vertebral arteries in the neck.  COMPARISON:  10/02/2008  FINDINGS: Criteria: Quantification of carotid stenosis is based on velocity parameters that correlate the residual internal carotid diameter with NASCET-based stenosis levels, using the diameter of the distal internal carotid lumen as the denominator for stenosis measurement.  The following velocity measurements were obtained:  RIGHT  ICA:  107 cm/sec  CCA:  73 cm/sec  SYSTOLIC ICA/CCA RATIO:  1.5  DIASTOLIC ICA/CCA RATIO:  ECA:  86 cm/sec  LEFT  ICA:  84 cm/sec  CCA:  82 cm/sec  SYSTOLIC ICA/CCA RATIO:  1.0  DIASTOLIC ICA/CCA RATIO:  ECA:  116 cm/sec  RIGHT CAROTID ARTERY: Little if any plaque.  Normal Doppler pattern.  RIGHT VERTEBRAL ARTERY:  Antegrade.  Normal Doppler pattern.  LEFT CAROTID ARTERY: Mild irregular plaque in the bulb. Low resistance internal carotid Doppler pattern.  LEFT VERTEBRAL ARTERY:  Antegrade.  Normal Doppler pattern.  IMPRESSION: Less than 50% stenosis in the right and left internal carotid arteries.   Electronically Signed   By: Maryclare Bean M.D.   On: 12/06/2013 09:54   Dg Shoulder Left  12/06/2013   CLINICAL DATA:  Neck and BILATERAL shoulder pain for years, no known injury  EXAM: LEFT SHOULDER - 2+ VIEW  COMPARISON:  None  FINDINGS: Osseous demineralization.  AC joint alignment normal.  No acute fracture, dislocation or bone destruction.  Visualized ribs intact.  IMPRESSION: No acute osseous abnormalities.   Electronically Signed   By: Lavonia Dana M.D.   On: 12/06/2013 08:52    ECHO 11-2013 - Mild LVH with LVEF 43-15%, grade 2 diastolic dysfunction. Mild left atrial enlargement. MAC with trivial mitral regurgitation. Unable to assess PASP. No obvious PFO or ASD. Small pericardial effusion adjacent to RV free wall.      Novah Goza A. Merlene Laughter, M.D.  Diplomate, Tax adviser of Psychiatry and Neurology ( Neurology). 12/06/2013, 5:37 PM

## 2013-12-06 NOTE — Progress Notes (Signed)
Orthostatic Vital Signs completed by tech.  Lying: 136/61 Sitting: 107/61 Standing: 86/47.  Notified Dr Felecia ShellingFanta through pager. No further orders given at this time.  Bed alarm on. Pt advised to alert nurse/tech when needs to go to bathroom. Will continue to monitor pt frequently throughout night. Bed is in lowest position & call bell is within reach.

## 2013-12-06 NOTE — Progress Notes (Signed)
UR completed 

## 2013-12-06 NOTE — Plan of Care (Signed)
Pt asking to be DCd, but still waiting ion Neurology to round on pt.  Doctor has been called several times by Diplomatic Services operational officersecretary and myself to confirm they are aware of consult and will be rounding....but still waiting at this point.

## 2013-12-06 NOTE — Evaluation (Signed)
Clinical/Bedside Swallow Evaluation  Patient Details  Name: [name] MRN: [id number] Date of Birth: December 14, 1945  Today's Date: 12/06/2013 Time: [id number] SLP Time Calculation (min): 35 min  Past Medical History:  Past Medical History  Diagnosis Date  . Hypertension   . Hyperlipidemia   . Hypothyroidism   . GERD (gastroesophageal reflux disease)   . PONV (postoperative nausea and vomiting)     Did not have this problem- 2010  . Orthostatic hypotension   . Shortness of breath     with exertion  . Arthritis     finger   . Current use of estrogen therapy 03/25/2013   Past Surgical History:  Past Surgical History  Procedure Laterality Date  . Partial hysterectomy    . Neck surgery      secondary to ruptured disc  . Colonoscopy  12/25/2010    Procedure: COLONOSCOPY;  Surgeon: Malissa HippoNajeeb U Rehman, MD;  Location: [facility];  Service: Endoscopy;  Laterality: N/A;  . Cardiac catheterization      x 2 at [facility] "nothing showed up"  . Abdominal hysterectomy    . Wrist fusion      Right- torn ligament  . Posterior cervical fusion/foraminotomy  12/18/2011    Procedure: POSTERIOR CERVICAL FUSION/FORAMINOTOMY LEVEL 2;  Surgeon: Hewitt Shortsobert W Nudelman, MD;  Location: [facility];  Service: Neurosurgery;  Laterality: Bilateral;  Cervical four to six Posterior cervical arthrodesis   HPI:  [name] is a 68 yo female who [facility] office visit with her PCP, Dr. Juanetta GoslingHawkins. She had 4 episodes of weakness which was mostly on the right side. She had some sensation of numbness on the right side as well. She had some problem with speech and with feeling that her tongue was not working properly. These episodes were over a proximally 36 hour period. She also says that she thinks her blood pressure has dropped. She does have a history of orthostatic hypotension in the past. She has had surgery on her neck for ruptured disc and has been having some neck and shoulder pain as well.  MRI is negative for acute event. Pt currently reports not difficulty with speech/language (resolved). SLP asked to evaluated pt as part of stroke protocol.    Studies/Results:  Mr Brain Wo Contrast  12/05/2013 CLINICAL DATA: TIA EXAM: MRI HEAD WITHOUT CONTRAST TECHNIQUE: Multiplanar, multiecho pulse sequences of the brain and surrounding structures were obtained without intravenous contrast. COMPARISON: MRI 10/01/2008 FINDINGS: Scattered small subcortical white matter hyperintensities show mild progression since 2010. Hyperintensity in the central midbrain unchanged. These findings are most consistent with chronic microvascular ischemia. Negative for acute infarct. Negative for hemorrhage or mass. Ventricle size is normal. Cerebral volume is normal. Paranasal sinuses are clear. IMPRESSION: Mild chronic microvascular ischemic change in the white matter and midbrain. No acute abnormality. Electronically Signed By: Marlan Palauharles Clark M.D. On: 12/05/2013 20:30   Cervical Spine: FINDINGS:  Status post surgical anterior and posterior fusion of C4, C5 and C6.  Good alignment of the vertebral bodies is noted. No fracture or  spondylolisthesis is noted. No significant neuroforaminal stenosis  is noted bilaterally.  IMPRESSION:  Status post surgical fusion of C4, C5 and C6. No other abnormality  seen in the cervical spine.   Assessment / Plan / Recommendation Clinical Impression  Mrs. Leitch was screened for cognitive linguistic deficits and found to be WNL, family present and in agreement. She did report difficulty swallow so bedside swallow evaluation was completed. Pt and  her sisters report that pt has always "strangled easily" even since she was a little girl. Pt's vocal quality is hoarse, which pt reports is her baseline for the past couple of years (? since C-spine repairs). She is embarrassed by this at work [employer]) when she has to speak on the intercom. Pt reports difficulty swallowing  pills and feels they "get stuck" and points to sternal notch. Pt has been seen by Dr. Karilyn Cota in the past for lower GI.   Oral motor exam is unremarkable except for mildly decreased hyolaryngeal excursion upon SLP palpation. Pt tolerated all po trials without incident while self feeding soda and graham crackers. Pt may benefit from GI consult and/or barium pill esophagram to assess esophageal function due to reports of globus sensation and difficulty with some solids.  Pt's hoarseness may be due to reflux or from previous c-spine surgeries (had anterior and posterior approach). No further SLP services indicated at this time. Continue diet as ordered.     Aspiration Risk  Mild    Diet Recommendation Regular;Thin liquid   Liquid Administration via: Straw;Cup Medication Administration: Whole meds with liquid Supervision: Patient able to self feed Postural Changes and/or Swallow Maneuvers: Seated upright 90 degrees;Upright 30-60 min after meal    Other  Recommendations Recommended Consults: Consider GI evaluation;Consider esophageal assessment Oral Care Recommendations: Oral care BID Other Recommendations: Clarify dietary restrictions   Follow Up Recommendations  None    Frequency and Duration  N/A     Pertinent Vitals/Pain VSS; no pain reported     Swallow Study Prior Functional Status  Type of Home: House Available Help at Discharge: Family;Available PRN/intermittently    General Date of Onset: 12/05/13 HPI: [name] is a 68 yo female who was admitted to [facility] after office visit with her PCP, Dr. Juanetta Gosling. She had 4 episodes of weakness which was mostly on the right side. She had some sensation of numbness on the right side as well. She had some problem with speech and with feeling that her tongue was not working properly. These episodes were over a proximally 36 hour period. She also says that she thinks her blood pressure has dropped. She does have a history of orthostatic  hypotension in the past. She has had surgery on her neck for ruptured disc and has been having some neck and shoulder pain as well. MRI is negative for acute event. Pt currently reports not difficulty with speech/language (resolved). SLP asked to evaluated pt as part of stroke protocol.  Type of Study: Bedside swallow evaluation Previous Swallow Assessment: none on record Diet Prior to this Study: Regular;Thin liquids Temperature Spikes Noted: No Respiratory Status: Room air History of Recent Intubation: No Behavior/Cognition: Alert;Cooperative;Pleasant mood Oral Cavity - Dentition: Adequate natural dentition Self-Feeding Abilities: Able to feed self Patient Positioning: Upright in bed Baseline Vocal Quality: Hoarse Volitional Cough: Strong Volitional Swallow: Able to elicit    Oral/Motor/Sensory Function Overall Oral Motor/Sensory Function: Appears within functional limits for tasks assessed Labial ROM: Within Functional Limits Labial Symmetry: Within Functional Limits Labial Strength: Within Functional Limits Lingual ROM: Within Functional Limits Lingual Symmetry: Within Functional Limits Lingual Strength: Within Functional Limits Facial ROM: Within Functional Limits Facial Symmetry: Within Functional Limits Facial Strength: Within Functional Limits Velum: Within Functional Limits Mandible: Within Functional Limits   Ice Chips Ice chips: Not tested   Thin Liquid Thin Liquid: Within functional limits Presentation: Straw;Self Fed Other Comments: mildly reduced hyolaryngeal excursion    Nectar Thick Nectar Thick  Liquid: Not tested   Honey Thick Honey Thick Liquid: Not tested   Puree Puree: Not tested   Solid   GO Functional Assessment Tool Used: clinical judgement Functional Limitations: Swallowing Swallow Current Status (Z6109): At least 1 percent but less than 20 percent impaired, limited or restricted Swallow Goal Status [phone]): At least 1 percent but less than 20 percent  impaired, limited or restricted Swallow Discharge Status [phone]): At least 1 percent but less than 20 percent impaired, limited or restricted  Solid: Within functional limits Presentation: Self Fed        Thank you,  Havery Moros, CCC-SLP [phone]  Matrice Herro 12/06/2013,11:48 AM

## 2013-12-06 NOTE — Progress Notes (Signed)
Subjective: She says she feels okay. She has no new complaints. She has not had any further episodes of transient weakness. Her blood pressure has been up somewhat. Her MRI did not show an acute infarction  Objective: Vital signs in last 24 hours: Temp:  [97.5 F (36.4 C)-98 F (36.7 C)] 98 F (36.7 C) (08/18 0529) Pulse Rate:  [71-72] 72 (08/18 0529) Resp:  [16-20] 18 (08/18 0529) BP: (160-180)/(62-72) 160/62 mmHg (08/18 0529) SpO2:  [97 %-99 %] 97 % (08/18 0529) Weight:  [68.72 kg (151 lb 8 oz)] 68.72 kg (151 lb 8 oz) (08/17 1707) Weight change:  Last BM Date: 12/05/13  Intake/Output from previous day:    PHYSICAL EXAM General appearance: alert, cooperative and mild distress Resp: clear to auscultation bilaterally Cardio: regular rate and rhythm, S1, S2 normal, no murmur, click, rub or gallop GI: soft, non-tender; bowel sounds normal; no masses,  no organomegaly Extremities: extremities normal, atraumatic, no cyanosis or edema  Lab Results:  Results for orders placed during the hospital encounter of 12/05/13 (from the past 48 hour(s))  COMPREHENSIVE METABOLIC PANEL     Status: Abnormal   Collection Time    12/05/13  6:13 PM      Result Value Ref Range   Sodium 142  137 - 147 mEq/L   Potassium 4.5  3.7 - 5.3 mEq/L   Chloride 102  96 - 112 mEq/L   CO2 28  19 - 32 mEq/L   Glucose, Bld 102 (*) 70 - 99 mg/dL   BUN 22  6 - 23 mg/dL   Creatinine, Ser 0.83  0.50 - 1.10 mg/dL   Calcium 9.4  8.4 - 10.5 mg/dL   Total Protein 7.6  6.0 - 8.3 g/dL   Albumin 4.3  3.5 - 5.2 g/dL   AST 27  0 - 37 U/L   ALT 19  0 - 35 U/L   Alkaline Phosphatase 89  39 - 117 U/L   Total Bilirubin 0.3  0.3 - 1.2 mg/dL   GFR calc non Af Amer 72 (*) >90 mL/min   GFR calc Af Amer 83 (*) >90 mL/min   Comment: (NOTE)     The eGFR has been calculated using the CKD EPI equation.     This calculation has not been validated in all clinical situations.     eGFR's persistently <90 mL/min signify possible  Chronic Kidney     Disease.   Anion gap 12  5 - 15  CBC WITH DIFFERENTIAL     Status: None   Collection Time    12/05/13  6:13 PM      Result Value Ref Range   WBC 6.0  4.0 - 10.5 K/uL   RBC 4.33  3.87 - 5.11 MIL/uL   Hemoglobin 13.5  12.0 - 15.0 g/dL   HCT 38.6  36.0 - 46.0 %   MCV 89.1  78.0 - 100.0 fL   MCH 31.2  26.0 - 34.0 pg   MCHC 35.0  30.0 - 36.0 g/dL   RDW 12.8  11.5 - 15.5 %   Platelets 215  150 - 400 K/uL   Neutrophils Relative % 52  43 - 77 %   Neutro Abs 3.1  1.7 - 7.7 K/uL   Lymphocytes Relative 35  12 - 46 %   Lymphs Abs 2.1  0.7 - 4.0 K/uL   Monocytes Relative 10  3 - 12 %   Monocytes Absolute 0.6  0.1 - 1.0 K/uL   Eosinophils  Relative 3  0 - 5 %   Eosinophils Absolute 0.2  0.0 - 0.7 K/uL   Basophils Relative 1  0 - 1 %   Basophils Absolute 0.0  0.0 - 0.1 K/uL  TSH     Status: None   Collection Time    12/05/13  6:13 PM      Result Value Ref Range   TSH 4.150  0.350 - 4.500 uIU/mL   Comment: Performed at [facility]    ABGS No results found for this basename: PHART, PCO2, PO2ART, TCO2, HCO3,  in the last 72 hours CULTURES No results found for this or any previous visit (from the past 240 hour(s)). Studies/Results: Mr Brain Wo Contrast  12/05/2013   CLINICAL DATA:  TIA  EXAM: MRI HEAD WITHOUT CONTRAST  TECHNIQUE: Multiplanar, multiecho pulse sequences of the brain and surrounding structures were obtained without intravenous contrast.  COMPARISON:  MRI 10/01/2008  FINDINGS: Scattered small subcortical white matter hyperintensities show mild progression since 2010. Hyperintensity in the central midbrain unchanged. These findings are most consistent with chronic microvascular ischemia.  Negative for acute infarct.  Negative for hemorrhage or mass.  Ventricle size is normal.  Cerebral volume is normal.  Paranasal sinuses are clear.  IMPRESSION: Mild chronic microvascular ischemic change in the white matter and midbrain. No acute abnormality.    Electronically Signed   By: Franchot Gallo M.D.   On: 12/05/2013 20:30    Medications:  Prior to Admission:  Prescriptions prior to admission  Medication Sig Dispense Refill  . amLODipine (NORVASC) 5 MG tablet Take 5 mg by mouth every evening.       Marland Kitchen aspirin 81 MG tablet Take 81 mg by mouth every evening.       . calcium-vitamin D (OSCAL WITH D) 500-200 MG-UNIT per tablet Take 1 tablet by mouth every evening.       Marland Kitchen levothyroxine (SYNTHROID, LEVOTHROID) 75 MCG tablet Take 75 mcg by mouth daily.        . multivitamin-iron-minerals-folic acid (CENTRUM) chewable tablet Chew 1 tablet by mouth every evening.       . naproxen sodium (ANAPROX) 220 MG tablet Take 440 mg by mouth 2 (two) times daily as needed. For pain      . HYDROcodone-acetaminophen (NORCO/VICODIN) 5-325 MG per tablet Take 1 tablet by mouth every 4 (four) hours as needed for pain.  10 tablet  0  . VIVELLE-DOT 0.05 MG/24HR Place 1 patch onto the skin once a week.        Scheduled: . amLODipine  5 mg Oral Daily  . aspirin  81 mg Oral Daily  . clopidogrel  75 mg Oral Daily  . enoxaparin (LOVENOX) injection  40 mg Subcutaneous Q24H  . levothyroxine  75 mcg Oral QAC breakfast  . simvastatin  10 mg Oral q1800  . sodium chloride  3 mL Intravenous Q12H  . sodium chloride  3 mL Intravenous Q12H   Continuous:  FXT:KWIOXB chloride, acetaminophen, acetaminophen, HYDROcodone-acetaminophen, ondansetron (ZOFRAN) IV, ondansetron, sodium chloride, traZODone  Assesment: She was admitted with possible TIA. Her MRI is negative. She has had previous neck surgery so it is possible that some of her weakness on the right side may be related to problems in her neck. She still has echocardiogram carotid Doppler and neurology consultation pending Active Problems:   TIA (transient ischemic attack)    Plan: She will also have x-ray of her neck and both shoulders which he complains are painful. she will have  orthostatic vital signs.    LOS: 1 day    Natividad Schlosser L 12/06/2013, 8:37 AM

## 2013-12-06 NOTE — Evaluation (Signed)
Physical Therapy Evaluation Patient Details Name: [name] MRN: [id number] DOB: [birth year] Today's Date: 12/06/2013   History of Present Illness  Pt is a 68 year old female who presents to my office for a work in visit after having had 4 episodes of weakness which was mostly on the right side. She had some sensation of numbness on the right side as well. She had some problem with speech and with feeling that her tongue was not working properly. These episodes were over a proximally 36 hour period. She also says that she thinks her blood pressure has dropped. She does have a history of orthostatic hypotension in the past. She has had surgery on her neck for ruptured disc and has been having some neck and shoulder pain as well - last sx to cervical spine was ~2 years ago.  MRI (-) for acute infarct.   Clinical Impression  Pt is a 68 year old female who presents to physical therapy with dx of TIA.  MMT performed for UE and LE without differences noted comparing Rt to Lt, though some weakness noted in (B) shoulders and hips with testing.  Pt (I) with bed mobility skills and mod (I) with transfers.  Pt able to amb in hallways without AD, though did notice deficits in balance in DGI testing placing pt as a possible fall risk.  Recommend OOPT services to address gait/balance deviations to decrease fall risk.  Pt to be d/c from acute PT services as pt is able to complete functional mobility skills at baseline.  No DME recommendations at this time.     Follow Up Recommendations Outpatient PT (Pt would benefit from OOPT services to address deficits in balance, noted during DGI assessment)    Equipment Recommendations  None recommended by PT    Recommendations for Other Services OT consult     Precautions / Restrictions Precautions Precautions: Fall Restrictions Weight Bearing Restrictions: No      Mobility  Bed Mobility Overal bed mobility: Independent                 Transfers Overall transfer level: Modified independent                  Ambulation/Gait Ambulation/Gait assistance: Min guard;Supervision Ambulation Distance (Feet): 450 Feet Assistive device: None Gait Pattern/deviations: WFL(Within Functional Limits)   Gait velocity interpretation: Below normal speed for age/gender General Gait Details: Noted occassional instances of staggering gait/unsteadiness, though no LOB or PT assist to correct balance.        Balance Overall balance assessment: No apparent balance deficits (not formally assessed)                               Standardized Balance Assessment Standardized Balance Assessment : Dynamic Gait Index   Dynamic Gait Index Level Surface: Normal Change in Gait Speed: Normal Gait with Horizontal Head Turns: Moderate Impairment Gait with Vertical Head Turns: Mild Impairment Gait and Pivot Turn: Mild Impairment Step Over Obstacle: Mild Impairment Step Around Obstacles: Normal Steps: Normal Total Score: 19       Pertinent Vitals/Pain Pain Assessment: No/denies pain    Home Living Family/patient expects to be discharged to:: Private residence Living Arrangements: Spouse/significant other (spouse has MS and hx of CVA affected Rt side) Available Help at Discharge: Family;Available PRN/intermittently Type of Home: House Home Access: Stairs to enter Entrance Stairs-Rails: Can reach both Entrance Stairs-Number of Steps: 2-3 Home Layout: One  level Home Equipment: Grab bars - toilet;Grab bars - tub/shower;Cane - quad Additional Comments: Tub shower, higher toilet seat    Prior Function Level of Independence: Independent               Hand Dominance   Dominant Hand: Left    Extremity/Trunk Assessment   Upper Extremity Assessment: RUE deficits/detail;LUE deficits/detail;Overall [facility] for tasks assessed RUE Deficits / Details: MMT shouler 4+/5, elbow 5/5.  ROM WFL.      LUE Deficits / Details:  MMT shouler 4+/5, elbow 5/5. ROM WFL   Lower Extremity Assessment: RLE deficits/detail;LLE deficits/detail;Overall WFL for tasks assessed RLE Deficits / Details: MMT hip 4/5, knee/ankle 5/5.  LLE Deficits / Details: MMT hip 4/5, knee/ankle 5/5.      Communication   Communication: No difficulties  Cognition Arousal/Alertness: Awake/alert Behavior During Therapy: WFL for tasks assessed/performed Overall Cognitive Status: Within Functional Limits for tasks assessed                        Assessment/Plan    PT Assessment All further PT needs can be met in the next venue of care  PT Diagnosis Abnormality of gait   PT Problem List Decreased strength;Decreased balance  PT Treatment Interventions     PT Goals (Current goals can be found in the Care Plan section) Acute Rehab PT Goals PT Goal Formulation: No goals set, d/c therapy     End of Session Equipment Utilized During Treatment: Gait belt Activity Tolerance: Patient tolerated treatment well Patient left: in bed;with call bell/phone within reach;with family/visitor present;with bed alarm set      Functional Limitation: Mobility: Walking and moving around Mobility: Walking and Moving Around Current Status (T7322): 0 percent impaired, limited or restricted Mobility: Walking and Moving Around Goal Status (G2542): At least 1 percent but less than 20 percent impaired, limited or restricted    Time: 1039-1057 PT Time Calculation (min): 18 min   Charges:   PT Evaluation $Initial PT Evaluation Tier I: 1 Procedure     PT G Codes:     Functional Limitation: Mobility: Walking and moving around    [facility] 12/06/2013, 11:11 AM

## 2013-12-06 NOTE — Progress Notes (Signed)
*  PRELIMINARY RESULTS* Echocardiogram 2D Echocardiogram has been performed.  Jeryl ColumbiaLLIOTT, Loriel Diehl 12/06/2013, 3:48 PM

## 2013-12-07 ENCOUNTER — Observation Stay (HOSPITAL_COMMUNITY)
Admission: AD | Admit: 2013-12-07 | Discharge: 2013-12-07 | Disposition: A | Discharge status: Home / Self Care | Payer: BC Managed Care – PPO | Source: Ambulatory Visit | Attending: Pulmonary Disease | Admitting: Pulmonary Disease

## 2013-12-07 DIAGNOSIS — G459 Transient cerebral ischemic attack, unspecified: Secondary | ICD-10-CM | POA: Diagnosis not present

## 2013-12-07 DIAGNOSIS — I951 Orthostatic hypotension: Secondary | ICD-10-CM | POA: Diagnosis present

## 2013-12-07 MED ORDER — STROKE: EARLY STAGES OF RECOVERY BOOK
Freq: Once | Status: AC
  Administered 2013-12-07: 11:00:00
  Filled 2013-12-07: qty 1

## 2013-12-07 MED ORDER — CLOPIDOGREL BISULFATE 75 MG PO TABS: 75.0000 mg | ORAL_TABLET | Freq: Every day | ORAL | Status: DC

## 2013-12-07 MED ORDER — PRAVASTATIN SODIUM 20 MG PO TABS: 20.0000 mg | ORAL_TABLET | Freq: Every day | ORAL | Status: AC

## 2013-12-07 NOTE — Progress Notes (Signed)
Subjective: She says she feels okay. She has not had any further episodes of weakness. Her workup so far is negative for a definite cause of her problem. Dr. Freddie Apley help is noted and appreciated. Her MRA DID not show any definite abnormalities it would cause her problem. She is scheduled for EEG  Objective: Vital signs in last 24 hours: Temp:  [97.7 F (36.5 C)-98.1 F (36.7 C)] 98.1 F (36.7 C) (08/19 0654) Pulse Rate:  [68-95] 81 (08/19 0654) Resp:  [17-20] 20 (08/19 0654) BP: (86-172)/(38-73) 138/60 mmHg (08/19 0654) SpO2:  [97 %-100 %] 97 % (08/19 0654) Weight change:  Last BM Date: 12/05/13  Intake/Output from previous day: 08/18 0701 - 08/19 0700 In: 480 [P.O.:480] Out: 1100 [Urine:1100]  PHYSICAL EXAM General appearance: alert, cooperative and no distress Resp: clear to auscultation bilaterally Cardio: regular rate and rhythm, S1, S2 normal, no murmur, click, rub or gallop GI: soft, non-tender; bowel sounds normal; no masses,  no organomegaly Extremities: extremities normal, atraumatic, no cyanosis or edema  Lab Results:  Results for orders placed during the hospital encounter of 12/05/13 (from the past 48 hour(s))  COMPREHENSIVE METABOLIC PANEL     Status: Abnormal   Collection Time    12/05/13  6:13 PM      Result Value Ref Range   Sodium 142  137 - 147 mEq/L   Potassium 4.5  3.7 - 5.3 mEq/L   Chloride 102  96 - 112 mEq/L   CO2 28  19 - 32 mEq/L   Glucose, Bld 102 (*) 70 - 99 mg/dL   BUN 22  6 - 23 mg/dL   Creatinine, Ser 0.83  0.50 - 1.10 mg/dL   Calcium 9.4  8.4 - 10.5 mg/dL   Total Protein 7.6  6.0 - 8.3 g/dL   Albumin 4.3  3.5 - 5.2 g/dL   AST 27  0 - 37 U/L   ALT 19  0 - 35 U/L   Alkaline Phosphatase 89  39 - 117 U/L   Total Bilirubin 0.3  0.3 - 1.2 mg/dL   GFR calc non Af Amer 72 (*) >90 mL/min   GFR calc Af Amer 83 (*) >90 mL/min   Comment: (NOTE)     The eGFR has been calculated using the CKD EPI equation.     This calculation has not been  validated in all clinical situations.     eGFR's persistently <90 mL/min signify possible Chronic Kidney     Disease.   Anion gap 12  5 - 15  CBC WITH DIFFERENTIAL     Status: None   Collection Time    12/05/13  6:13 PM      Result Value Ref Range   WBC 6.0  4.0 - 10.5 K/uL   RBC 4.33  3.87 - 5.11 MIL/uL   Hemoglobin 13.5  12.0 - 15.0 g/dL   HCT 38.6  36.0 - 46.0 %   MCV 89.1  78.0 - 100.0 fL   MCH 31.2  26.0 - 34.0 pg   MCHC 35.0  30.0 - 36.0 g/dL   RDW 12.8  11.5 - 15.5 %   Platelets 215  150 - 400 K/uL   Neutrophils Relative % 52  43 - 77 %   Neutro Abs 3.1  1.7 - 7.7 K/uL   Lymphocytes Relative 35  12 - 46 %   Lymphs Abs 2.1  0.7 - 4.0 K/uL   Monocytes Relative 10  3 - 12 %   Monocytes  Absolute 0.6  0.1 - 1.0 K/uL   Eosinophils Relative 3  0 - 5 %   Eosinophils Absolute 0.2  0.0 - 0.7 K/uL   Basophils Relative 1  0 - 1 %   Basophils Absolute 0.0  0.0 - 0.1 K/uL  TSH     Status: None   Collection Time    12/05/13  6:13 PM      Result Value Ref Range   TSH 4.150  0.350 - 4.500 uIU/mL   Comment: Performed at [facility]    ABGS No results found for this basename: PHART, PCO2, PO2ART, TCO2, HCO3,  in the last 72 hours CULTURES No results found for this or any previous visit (from the past 240 hour(s)). Studies/Results: Dg Cervical Spine Complete  12/06/2013   CLINICAL DATA:  Neck pain.  EXAM: CERVICAL SPINE  4+ VIEWS  COMPARISON:  April 30, 2012.  FINDINGS: Status post surgical anterior and posterior fusion of C4, C5 and C6. Good alignment of the vertebral bodies is noted. No fracture or spondylolisthesis is noted. No significant neuroforaminal stenosis is noted bilaterally.  IMPRESSION: Status post surgical fusion of C4, C5 and C6. No other abnormality seen in the cervical spine.   Electronically Signed   By: Sabino Dick M.D.   On: 12/06/2013 08:53   Dg Shoulder Right  12/06/2013   CLINICAL DATA:  Neck and BILATERAL shoulder pain for years, no known injury   EXAM: RIGHT SHOULDER - 2+ VIEW  COMPARISON:  None  FINDINGS: Osseous demineralization.  AC joint alignment normal.  No acute fracture, dislocation, or bone destruction.  Visualized RIGHT ribs intact.  IMPRESSION: No acute osseous abnormalities.   Electronically Signed   By: Lavonia Dana M.D.   On: 12/06/2013 08:52   Mr Jodene Nam Head Wo Contrast  12/06/2013   CLINICAL DATA:  68 year old female with weakness and syncope. TIA. Initial encounter.  EXAM: MRA HEAD WITHOUT CONTRAST  TECHNIQUE: Angiographic images of the Circle of Willis were obtained using MRA technique without intravenous contrast.  COMPARISON:  Brain MRI 12/05/2013.  FINDINGS: Antegrade flow in the posterior circulation with dominant distal left vertebral artery. Normal right PICA and dominant appearing left AICA origins. No basilar stenosis. SCA and PCA origins are normal. Posterior communicating arteries are diminutive or absent. Bilateral PCA branches are within normal limits.  Antegrade flow in both ICA siphons. Mild siphon irregularity with no stenosis. Ophthalmic artery origins are within normal limits. Normal carotid termini. Normal MCA and ACA origins. Normal anterior communicating artery and visualized bilateral ACA branches. Visualized bilateral MCA branches are within normal limits.  IMPRESSION: Negative intracranial MRA.   Electronically Signed   By: Lars Pinks M.D.   On: 12/06/2013 21:00   Mr Brain Wo Contrast  12/05/2013   CLINICAL DATA:  TIA  EXAM: MRI HEAD WITHOUT CONTRAST  TECHNIQUE: Multiplanar, multiecho pulse sequences of the brain and surrounding structures were obtained without intravenous contrast.  COMPARISON:  MRI 10/01/2008  FINDINGS: Scattered small subcortical white matter hyperintensities show mild progression since 2010. Hyperintensity in the central midbrain unchanged. These findings are most consistent with chronic microvascular ischemia.  Negative for acute infarct.  Negative for hemorrhage or mass.  Ventricle size is  normal.  Cerebral volume is normal.  Paranasal sinuses are clear.  IMPRESSION: Mild chronic microvascular ischemic change in the white matter and midbrain. No acute abnormality.   Electronically Signed   By: Franchot Gallo M.D.   On: 12/05/2013 20:30   US Carotid  Bilateral  12/06/2013   CLINICAL DATA:  TIA  EXAM: BILATERAL CAROTID DUPLEX ULTRASOUND  TECHNIQUE: Pearline Cables scale imaging, color Doppler and duplex ultrasound were performed of bilateral carotid and vertebral arteries in the neck.  COMPARISON:  10/02/2008  FINDINGS: Criteria: Quantification of carotid stenosis is based on velocity parameters that correlate the residual internal carotid diameter with NASCET-based stenosis levels, using the diameter of the distal internal carotid lumen as the denominator for stenosis measurement.  The following velocity measurements were obtained:  RIGHT  ICA:  107 cm/sec  CCA:  73 cm/sec  SYSTOLIC ICA/CCA RATIO:  1.5  DIASTOLIC ICA/CCA RATIO:  ECA:  86 cm/sec  LEFT  ICA:  84 cm/sec  CCA:  82 cm/sec  SYSTOLIC ICA/CCA RATIO:  1.0  DIASTOLIC ICA/CCA RATIO:  ECA:  116 cm/sec  RIGHT CAROTID ARTERY: Little if any plaque.  Normal Doppler pattern.  RIGHT VERTEBRAL ARTERY:  Antegrade.  Normal Doppler pattern.  LEFT CAROTID ARTERY: Mild irregular plaque in the bulb. Low resistance internal carotid Doppler pattern.  LEFT VERTEBRAL ARTERY:  Antegrade.  Normal Doppler pattern.  IMPRESSION: Less than 50% stenosis in the right and left internal carotid arteries.   Electronically Signed   By: Maryclare Bean M.D.   On: 12/06/2013 09:54   Dg Shoulder Left  12/06/2013   CLINICAL DATA:  Neck and BILATERAL shoulder pain for years, no known injury  EXAM: LEFT SHOULDER - 2+ VIEW  COMPARISON:  None  FINDINGS: Osseous demineralization.  AC joint alignment normal.  No acute fracture, dislocation or bone destruction.  Visualized ribs intact.  IMPRESSION: No acute osseous abnormalities.   Electronically Signed   By: Lavonia Dana M.D.   On: 12/06/2013 08:52     Medications:  Prior to Admission:  Prescriptions prior to admission  Medication Sig Dispense Refill  . amLODipine (NORVASC) 5 MG tablet Take 5 mg by mouth every evening.       Marland Kitchen aspirin 81 MG tablet Take 81 mg by mouth every evening.       . calcium-vitamin D (OSCAL WITH D) 500-200 MG-UNIT per tablet Take 1 tablet by mouth every evening.       Marland Kitchen levothyroxine (SYNTHROID, LEVOTHROID) 75 MCG tablet Take 75 mcg by mouth daily.        . multivitamin-iron-minerals-folic acid (CENTRUM) chewable tablet Chew 1 tablet by mouth every evening.       . naproxen sodium (ANAPROX) 220 MG tablet Take 440 mg by mouth 2 (two) times daily as needed. For pain      . HYDROcodone-acetaminophen (NORCO/VICODIN) 5-325 MG per tablet Take 1 tablet by mouth every 4 (four) hours as needed for pain.  10 tablet  0  . VIVELLE-DOT 0.05 MG/24HR Place 1 patch onto the skin once a week.        Scheduled: . amLODipine  5 mg Oral Daily  . aspirin  81 mg Oral Daily  . clopidogrel  75 mg Oral Daily  . enoxaparin (LOVENOX) injection  40 mg Subcutaneous Q24H  . levothyroxine  75 mcg Oral QAC breakfast  . LORazepam  1 mg Intramuscular Once  . simvastatin  10 mg Oral q1800  . sodium chloride  3 mL Intravenous Q12H  . sodium chloride  3 mL Intravenous Q12H   Continuous:  MEB:RAXENM chloride, acetaminophen, acetaminophen, HYDROcodone-acetaminophen, ondansetron (ZOFRAN) IV, ondansetron, sodium chloride, traZODone  Assesment: She has had episodes of transient change in consciousness and weakness. It is not totally clear what this is but it may  be TIA. She is scheduled for an EEG. She does have significant orthostasis. Active Problems:   TIA (transient ischemic attack)    Plan: She will have EMG and I will plan for her to be discharged. She will have her compression stockings to see if it will make a difference with her blood pressure    LOS: 2 days   Brooks Kinnan L 12/07/2013, 8:32 AM

## 2013-12-07 NOTE — Procedures (Signed)
  [facility] Shynia Daleo A. Gerilyn Pilgrimoonquah, MD     [email or web address]           HISTORY: This is a 68 year old female who presents with episodes of focal neurological deficits involving the right side associated with slurred speech. The EEG is being done to evaluate for possible partial seizures.  MEDICATIONS: Scheduled Meds: Continuous Infusions: PRN Meds:.    Prior to Admission medications   Medication Sig Start Date End Date Taking? Authorizing Provider  amLODipine (NORVASC) 5 MG tablet Take 5 mg by mouth every evening.  01/11/11   Historical Provider, MD  aspirin 81 MG tablet Take 81 mg by mouth every evening.     Historical Provider, MD  calcium-vitamin D (OSCAL WITH D) 500-200 MG-UNIT per tablet Take 1 tablet by mouth every evening.     Historical Provider, MD  clopidogrel (PLAVIX) 75 MG tablet Take 1 tablet (75 mg total) by mouth daily. 12/07/13   Fredirick MaudlinEdward L Hawkins, MD  HYDROcodone-acetaminophen (NORCO/VICODIN) 5-325 MG per tablet Take 1 tablet by mouth every 4 (four) hours as needed for pain. 08/05/12   Lyanne CoKevin M Campos, MD  levothyroxine (SYNTHROID, LEVOTHROID) 75 MCG tablet Take 75 mcg by mouth daily.      Historical Provider, MD  multivitamin-iron-minerals-folic acid (CENTRUM) chewable tablet Chew 1 tablet by mouth every evening.     Historical Provider, MD  naproxen sodium (ANAPROX) 220 MG tablet Take 440 mg by mouth 2 (two) times daily as needed. For pain    Historical Provider, MD  pravastatin (PRAVACHOL) 20 MG tablet Take 1 tablet (20 mg total) by mouth daily. 12/07/13   Fredirick MaudlinEdward L Hawkins, MD  VIVELLE-DOT 0.05 MG/24HR Place 1 patch onto the skin once a week.  04/24/10   Historical Provider, MD      ANALYSIS: A 16 channel recording using standard 10 20 measurements is conducted for Approximately 20 minutes. There is a well-formed posterior dominant rhythm of 9-9-1/2 Hz which attenuates with eye opening. There is beta activity observed in the frontal areas. Awake and drowsy  activities are recorded. Photic stimulation and hyperventilation were not carried out. There is no focal slowing. There is no lateralized slowing. There is no epileptiform activity observed.   IMPRESSION: 1. This a normal recording of the awake and drowsy states.      Valerie Fredin A. Gerilyn Pilgrimoonquah, M.D.  Diplomate, Biomedical engineerAmerican Board of Psychiatry and Neurology ( Neurology).

## 2013-12-07 NOTE — Progress Notes (Signed)
Patient given stroke book and handouts, states understanding of material.

## 2013-12-07 NOTE — Discharge Summary (Signed)
Physician Discharge Summary  Patient ID: [name] MRN: [id number] DOB/AGE: 1945/11/20 68 y.o. Primary Care Physician:Falyn Rubel L, MD Admit date: 12/05/2013 Discharge date: 12/07/2013    Discharge Diagnoses:   Active Problems:   TIA (transient ischemic attack)  hypertension Hyperlipidemia Orthostatic hypotension    Medication List         amLODipine 5 MG tablet  Commonly known as:  NORVASC  Take 5 mg by mouth every evening.     aspirin 81 MG tablet  Take 81 mg by mouth every evening.     calcium-vitamin D 500-200 MG-UNIT per tablet  Commonly known as:  OSCAL WITH D  Take 1 tablet by mouth every evening.     clopidogrel 75 MG tablet  Commonly known as:  PLAVIX  Take 1 tablet (75 mg total) by mouth daily.     HYDROcodone-acetaminophen 5-325 MG per tablet  Commonly known as:  NORCO/VICODIN  Take 1 tablet by mouth every 4 (four) hours as needed for pain.     levothyroxine 75 MCG tablet  Commonly known as:  SYNTHROID, LEVOTHROID  Take 75 mcg by mouth daily.     multivitamin-iron-minerals-folic acid chewable tablet  Chew 1 tablet by mouth every evening.     naproxen sodium 220 MG tablet  Commonly known as:  ANAPROX  Take 440 mg by mouth 2 (two) times daily as needed. For pain     pravastatin 20 MG tablet  Commonly known as:  PRAVACHOL  Take 1 tablet (20 mg total) by mouth daily.     VIVELLE-DOT 0.05 MG/24HR patch  Generic drug:  estradiol  Place 1 patch onto the skin once a week.        Discharged Condition: Improved    Consults: Neurology  Significant Diagnostic Studies: Dg Cervical Spine Complete  12/06/2013   CLINICAL DATA:  Neck pain.  EXAM: CERVICAL SPINE  4+ VIEWS  COMPARISON:  April 30, 2012.  FINDINGS: Status post surgical anterior and posterior fusion of C4, C5 and C6. Good alignment of the vertebral bodies is noted. No fracture or spondylolisthesis is noted. No significant neuroforaminal stenosis is noted bilaterally.  IMPRESSION:  Status post surgical fusion of C4, C5 and C6. No other abnormality seen in the cervical spine.   Electronically Signed   By: Roque Lias M.D.   On: 12/06/2013 08:53   Dg Shoulder Right  12/06/2013   CLINICAL DATA:  Neck and BILATERAL shoulder pain for years, no known injury  EXAM: RIGHT SHOULDER - 2+ VIEW  COMPARISON:  None  FINDINGS: Osseous demineralization.  AC joint alignment normal.  No acute fracture, dislocation, or bone destruction.  Visualized RIGHT ribs intact.  IMPRESSION: No acute osseous abnormalities.   Electronically Signed   By: Ulyses Southward M.D.   On: 12/06/2013 08:52   Mr Maxine Glenn Head Wo Contrast  12/06/2013   CLINICAL DATA:  68 year old female with weakness and syncope. TIA. Initial encounter.  EXAM: MRA HEAD WITHOUT CONTRAST  TECHNIQUE: Angiographic images of the Circle of Willis were obtained using MRA technique without intravenous contrast.  COMPARISON:  Brain MRI 12/05/2013.  FINDINGS: Antegrade flow in the posterior circulation with dominant distal left vertebral artery. Normal right PICA and dominant appearing left AICA origins. No basilar stenosis. SCA and PCA origins are normal. Posterior communicating arteries are diminutive or absent. Bilateral PCA branches are within normal limits.  Antegrade flow in both ICA siphons. Mild siphon irregularity with no stenosis. Ophthalmic artery origins are within normal limits. Normal carotid termini.  Normal MCA and ACA origins. Normal anterior communicating artery and visualized bilateral ACA branches. Visualized bilateral MCA branches are within normal limits.  IMPRESSION: Negative intracranial MRA.   Electronically Signed   By: Augusto GambleLee  Hall M.D.   On: 12/06/2013 21:00   Mr Brain Wo Contrast  12/05/2013   CLINICAL DATA:  TIA  EXAM: MRI HEAD WITHOUT CONTRAST  TECHNIQUE: Multiplanar, multiecho pulse sequences of the brain and surrounding structures were obtained without intravenous contrast.  COMPARISON:  MRI 10/01/2008  FINDINGS: Scattered small  subcortical white matter hyperintensities show mild progression since 2010. Hyperintensity in the central midbrain unchanged. These findings are most consistent with chronic microvascular ischemia.  Negative for acute infarct.  Negative for hemorrhage or mass.  Ventricle size is normal.  Cerebral volume is normal.  Paranasal sinuses are clear.  IMPRESSION: Mild chronic microvascular ischemic change in the white matter and midbrain. No acute abnormality.   Electronically Signed   By: Marlan Palauharles  Clark M.D.   On: 12/05/2013 20:30   [place] Carotid Bilateral  12/06/2013   CLINICAL DATA:  TIA  EXAM: BILATERAL CAROTID DUPLEX ULTRASOUND  TECHNIQUE: Wallace CullensGray scale imaging, color Doppler and duplex ultrasound were performed of bilateral carotid and vertebral arteries in the neck.  COMPARISON:  10/02/2008  FINDINGS: Criteria: Quantification of carotid stenosis is based on velocity parameters that correlate the residual internal carotid diameter with NASCET-based stenosis levels, using the diameter of the distal internal carotid lumen as the denominator for stenosis measurement.  The following velocity measurements were obtained:  RIGHT  ICA:  107 cm/sec  CCA:  73 cm/sec  SYSTOLIC ICA/CCA RATIO:  1.5  DIASTOLIC ICA/CCA RATIO:  ECA:  86 cm/sec  LEFT  ICA:  84 cm/sec  CCA:  82 cm/sec  SYSTOLIC ICA/CCA RATIO:  1.0  DIASTOLIC ICA/CCA RATIO:  ECA:  116 cm/sec  RIGHT CAROTID ARTERY: Little if any plaque.  Normal Doppler pattern.  RIGHT VERTEBRAL ARTERY:  Antegrade.  Normal Doppler pattern.  LEFT CAROTID ARTERY: Mild irregular plaque in the bulb. Low resistance internal carotid Doppler pattern.  LEFT VERTEBRAL ARTERY:  Antegrade.  Normal Doppler pattern.  IMPRESSION: Less than 50% stenosis in the right and left internal carotid arteries.   Electronically Signed   By: Maryclare BeanArt  Hoss M.D.   On: 12/06/2013 09:54   Dg Shoulder Left  12/06/2013   CLINICAL DATA:  Neck and BILATERAL shoulder pain for years, no known injury  EXAM: LEFT SHOULDER - 2+  VIEW  COMPARISON:  None  FINDINGS: Osseous demineralization.  AC joint alignment normal.  No acute fracture, dislocation or bone destruction.  Visualized ribs intact.  IMPRESSION: No acute osseous abnormalities.   Electronically Signed   By: Ulyses SouthwardMark  Boles M.D.   On: 12/06/2013 08:52    Lab Results: Basic Metabolic Panel:  Recent Labs  40/98/1108/17/15 1813  NA 142  K 4.5  CL 102  CO2 28  GLUCOSE 102*  BUN 22  CREATININE 0.83  CALCIUM 9.4   Liver Function Tests:  Recent Labs  12/05/13 1813  AST 27  ALT 19  ALKPHOS 89  BILITOT 0.3  PROT 7.6  ALBUMIN 4.3     CBC:  Recent Labs  12/05/13 1813  WBC 6.0  NEUTROABS 3.1  HGB 13.5  HCT 38.6  MCV 89.1  PLT 215    No results found for this or any previous visit (from the past 240 hour(s)).   Hospital Course: This is a 68 year old who was admitted from my office. She had been  in her usual state of good health at home when over the previous 24-48 hours she had 4 episodes of feeling like she was weak on the right side and had a sensation that her tongue was numb. When I examined her in my office she was not having one of the episodes and her neurologic examination was grossly normal except she did have some dysarthria. Her tongue protruded midline. She was brought to the hospital with the thought this was likely a TIA. She underwent MR I and MRA both of which did not show a cause of her problem. She had echocardiogram that showed that she had left ventricular hypertrophy and diastolic dysfunction but normal left ventricular systolic function and no significant valvular heart disease. She had carotid Doppler which showed less than 50% narrowing on the left and the right was essentially normal. She was checked for orthostasis and that was present. She had been on a statin but she had stopped that on her own and this was reinitiated. She had urology consultation and is set for EEG which will be pending at the time of discharge  Discharge  Exam: Blood pressure 138/60, pulse 81, temperature 98.1 F (36.7 C), temperature source Oral, resp. rate 20, height 5\' 5"  (1.651 m), weight 68.72 kg (151 lb 8 oz), SpO2 97.00%. She is awake and alert. She is neurologically intact. Her chest is clear. Her heart is regular  Disposition: Home she's going to do outpatient physical therapy      Discharge Instructions   Discharge patient    Complete by:  As directed   After EEG           Follow-up Information   Follow up with [facility]. (Physical Therapy will call to arrange appointment)    Specialty:  Rehabilitation   Contact information:   [id number] [place] [address] [phone]      Signed: Krystianna Soth L   12/07/2013, 8:40 AM

## 2013-12-07 NOTE — Care Management Note (Signed)
    Page 1 of 1   12/07/2013     8:43:49 AM CARE MANAGEMENT NOTE 12/07/2013  Patient:  [name],[name]   Account Number:  [id number]  Date Initiated:  12/07/2013  Documentation initiated by:  Kathyrn SheriffHILDRESS,JESSICA  Subjective/Objective Assessment:   Patient admitted from home with TIA. patient independent at home with no HH services, DME's or medication needs. PT has recommended OP PT services. [facility] is agreeable.     Action/Plan:   Patient plans to discharge home with self care today. Patient has been set up with OP PT through [facility]. Rehab will call pt to arrange appointment. Patient has no further CM needs at this time.   Anticipated DC Date:  12/07/2013   Anticipated DC Plan:  HOME/SELF CARE      DC Planning Services  CM consult      Choice offered to / List presented to:             Status of service:  Completed, signed off Medicare Important Message given?   (If response is "NO", the following Medicare IM given date fields will be blank) Date Medicare IM given:   Medicare IM given by:   Date Additional Medicare IM given:   Additional Medicare IM given by:    Discharge Disposition:  HOME/SELF CARE  Per UR Regulation:    If discussed at Long Length of Stay Meetings, dates discussed:    Comments:  12/07/2013 0830 Kathyrn SheriffJessica Childress, RN, MSN, [facility]

## 2013-12-07 NOTE — Progress Notes (Signed)
Patient states understanding of discharge instructions.  

## 2013-12-07 NOTE — Progress Notes (Signed)
EEG Completed; Results Pending  

## 2013-12-08 ENCOUNTER — Emergency Department (HOSPITAL_COMMUNITY): Payer: Medicare Other

## 2013-12-08 ENCOUNTER — Observation Stay (HOSPITAL_COMMUNITY): Payer: Medicare Other

## 2013-12-08 ENCOUNTER — Inpatient Hospital Stay (HOSPITAL_COMMUNITY)
Admission: EM | Admit: 2013-12-08 | Discharge: 2013-12-13 | DRG: 312 | Disposition: A | Discharge status: Home / Self Care | Payer: Medicare Other | Attending: Pulmonary Disease | Admitting: Pulmonary Disease

## 2013-12-08 ENCOUNTER — Encounter (HOSPITAL_COMMUNITY): Payer: Self-pay | Admitting: Emergency Medicine

## 2013-12-08 DIAGNOSIS — Z8673 Personal history of transient ischemic attack (TIA), and cerebral infarction without residual deficits: Secondary | ICD-10-CM

## 2013-12-08 DIAGNOSIS — I10 Essential (primary) hypertension: Secondary | ICD-10-CM | POA: Diagnosis present

## 2013-12-08 DIAGNOSIS — I447 Left bundle-branch block, unspecified: Secondary | ICD-10-CM | POA: Diagnosis present

## 2013-12-08 DIAGNOSIS — E039 Hypothyroidism, unspecified: Secondary | ICD-10-CM | POA: Diagnosis present

## 2013-12-08 DIAGNOSIS — G459 Transient cerebral ischemic attack, unspecified: Secondary | ICD-10-CM | POA: Diagnosis present

## 2013-12-08 DIAGNOSIS — E785 Hyperlipidemia, unspecified: Secondary | ICD-10-CM | POA: Diagnosis present

## 2013-12-08 DIAGNOSIS — R569 Unspecified convulsions: Secondary | ICD-10-CM | POA: Diagnosis present

## 2013-12-08 DIAGNOSIS — M129 Arthropathy, unspecified: Secondary | ICD-10-CM | POA: Diagnosis present

## 2013-12-08 DIAGNOSIS — K219 Gastro-esophageal reflux disease without esophagitis: Secondary | ICD-10-CM | POA: Diagnosis present

## 2013-12-08 DIAGNOSIS — G8929 Other chronic pain: Secondary | ICD-10-CM | POA: Diagnosis present

## 2013-12-08 DIAGNOSIS — Z981 Arthrodesis status: Secondary | ICD-10-CM

## 2013-12-08 DIAGNOSIS — Z7902 Long term (current) use of antithrombotics/antiplatelets: Secondary | ICD-10-CM | POA: Diagnosis not present

## 2013-12-08 DIAGNOSIS — Z833 Family history of diabetes mellitus: Secondary | ICD-10-CM

## 2013-12-08 DIAGNOSIS — Z800 Family history of malignant neoplasm of digestive organs: Secondary | ICD-10-CM

## 2013-12-08 DIAGNOSIS — Z7982 Long term (current) use of aspirin: Secondary | ICD-10-CM

## 2013-12-08 DIAGNOSIS — R55 Syncope and collapse: Secondary | ICD-10-CM | POA: Diagnosis present

## 2013-12-08 DIAGNOSIS — I951 Orthostatic hypotension: Principal | ICD-10-CM | POA: Diagnosis present

## 2013-12-08 DIAGNOSIS — E782 Mixed hyperlipidemia: Secondary | ICD-10-CM | POA: Diagnosis present

## 2013-12-08 HISTORY — DX: Other specified postprocedural states: Z98.890

## 2013-12-08 HISTORY — DX: Left bundle-branch block, unspecified: I44.7

## 2013-12-08 LAB — CBC WITH DIFFERENTIAL/PLATELET
BASOS ABS: 0 10*3/uL (ref 0.0–0.1)
Basophils Relative: 1 % (ref 0–1)
Eosinophils Absolute: 0.1 10*3/uL (ref 0.0–0.7)
Eosinophils Relative: 2 % (ref 0–5)
HCT: 39.9 % (ref 36.0–46.0)
Hemoglobin: 14.1 g/dL (ref 12.0–15.0)
LYMPHS ABS: 1.1 10*3/uL (ref 0.7–4.0)
Lymphocytes Relative: 22 % (ref 12–46)
MCH: 31.1 pg (ref 26.0–34.0)
MCHC: 35.3 g/dL (ref 30.0–36.0)
MCV: 87.9 fL (ref 78.0–100.0)
Monocytes Absolute: 0.5 10*3/uL (ref 0.1–1.0)
Monocytes Relative: 10 % (ref 3–12)
NEUTROS ABS: 3.3 10*3/uL (ref 1.7–7.7)
Neutrophils Relative %: 65 % (ref 43–77)
PLATELETS: 196 10*3/uL (ref 150–400)
RBC: 4.54 MIL/uL (ref 3.87–5.11)
RDW: 12.7 % (ref 11.5–15.5)
WBC: 4.9 10*3/uL (ref 4.0–10.5)

## 2013-12-08 LAB — COMPREHENSIVE METABOLIC PANEL
ALT: 15 U/L (ref 0–35)
AST: 23 U/L (ref 0–37)
Albumin: 4.2 g/dL (ref 3.5–5.2)
Alkaline Phosphatase: 93 U/L (ref 39–117)
Anion gap: 12 (ref 5–15)
BILIRUBIN TOTAL: 0.5 mg/dL (ref 0.3–1.2)
BUN: 16 mg/dL (ref 6–23)
CHLORIDE: 104 meq/L (ref 96–112)
CO2: 25 meq/L (ref 19–32)
Calcium: 9.6 mg/dL (ref 8.4–10.5)
Creatinine, Ser: 0.87 mg/dL (ref 0.50–1.10)
GFR calc Af Amer: 79 mL/min — ABNORMAL LOW (ref >90)
GFR, EST NON AFRICAN AMERICAN: 68 mL/min — AB (ref >90)
GLUCOSE: 105 mg/dL — AB (ref 70–99)
POTASSIUM: 4.3 meq/L (ref 3.7–5.3)
Sodium: 141 mEq/L (ref 137–147)
Total Protein: 7.4 g/dL (ref 6.0–8.3)

## 2013-12-08 LAB — D-DIMER, QUANTITATIVE (NOT AT ARMC): D DIMER QUANT: 0.48 ug{FEU}/mL (ref 0.00–0.48)

## 2013-12-08 LAB — TROPONIN I: Troponin I: <0.3 ng/mL (ref <0.30)

## 2013-12-08 MED ORDER — CLOPIDOGREL BISULFATE 75 MG PO TABS
75.0000 mg | ORAL_TABLET | Freq: Every day | ORAL | Status: DC
  Administered 2013-12-08 – 2013-12-13 (×6): 75 mg via ORAL
  Filled 2013-12-08 (×6): qty 1

## 2013-12-08 MED ORDER — SODIUM CHLORIDE 0.9 % IV SOLN
INTRAVENOUS | Status: DC
  Administered 2013-12-08 – 2013-12-10 (×3): via INTRAVENOUS

## 2013-12-08 MED ORDER — MORPHINE SULFATE 4 MG/ML IJ SOLN
4.0000 mg | Freq: Once | INTRAMUSCULAR | Status: AC
  Administered 2013-12-08: 4 mg via INTRAVENOUS
  Filled 2013-12-08: qty 1

## 2013-12-08 MED ORDER — ACETAMINOPHEN 650 MG RE SUPP: 650.0000 mg | RECTAL | Status: DC | PRN

## 2013-12-08 MED ORDER — SIMVASTATIN 10 MG PO TABS
10.0000 mg | ORAL_TABLET | Freq: Every day | ORAL | Status: DC
  Administered 2013-12-08 – 2013-12-12 (×5): 10 mg via ORAL
  Filled 2013-12-08 (×5): qty 1

## 2013-12-08 MED ORDER — LORAZEPAM 2 MG/ML IJ SOLN
0.5000 mg | Freq: Once | INTRAMUSCULAR | Status: AC
  Administered 2013-12-08: 0.5 mg via INTRAVENOUS
  Filled 2013-12-08: qty 1

## 2013-12-08 MED ORDER — LEVOTHYROXINE SODIUM 75 MCG PO TABS
75.0000 ug | ORAL_TABLET | Freq: Every day | ORAL | Status: DC
  Administered 2013-12-09 – 2013-12-13 (×5): 75 ug via ORAL
  Filled 2013-12-08 (×5): qty 1

## 2013-12-08 MED ORDER — AMLODIPINE BESYLATE 5 MG PO TABS
5.0000 mg | ORAL_TABLET | Freq: Every day | ORAL | Status: DC
  Administered 2013-12-09: 5 mg via ORAL
  Filled 2013-12-08: qty 1

## 2013-12-08 MED ORDER — ASPIRIN EC 81 MG PO TBEC
81.0000 mg | DELAYED_RELEASE_TABLET | Freq: Every day | ORAL | Status: DC
  Administered 2013-12-08 – 2013-12-12 (×4): 81 mg via ORAL
  Filled 2013-12-08 (×5): qty 1

## 2013-12-08 MED ORDER — MIDODRINE HCL 5 MG PO TABS
5.0000 mg | ORAL_TABLET | Freq: Three times a day (TID) | ORAL | Status: DC
  Administered 2013-12-09 – 2013-12-11 (×7): 5 mg via ORAL
  Filled 2013-12-08 (×7): qty 1

## 2013-12-08 MED ORDER — ACETAMINOPHEN 325 MG PO TABS
650.0000 mg | ORAL_TABLET | ORAL | Status: DC | PRN
  Administered 2013-12-08 – 2013-12-12 (×5): 650 mg via ORAL
  Filled 2013-12-08 (×5): qty 2

## 2013-12-08 NOTE — Progress Notes (Signed)
Patient passed bedside swallow study.

## 2013-12-08 NOTE — Consult Note (Signed)
Vinton A. Merlene Laughter, MD     [email or web address]          [name] is an 68 y.o. female.   ASSESSMENT/PLAN: 1. The current event seems consistent with a syncopal seizure due to orthostatic hypotension. She was orthostatic in a couple days ago when she was admitted. The orthostatics will be repeated but I think we should try her on low-dose Florinef or ProAmatine.   2. Previous spells of focal neurological symptoms with negative workup but seemed to be TIA. Continue with antiplatelet agents and other risk factor reductions.  The patient is 68 year old white female who was recently admitted to the hospital with focal neurological symptoms lasted for a couple of minutes. She had extensive workup which was unrevealing. The diagnosis was a TIA/TIAs and she was treated properly with antiplatelet agents and statin medications. Since being discharged, the patient has had 4 more spells of presyncope occurring exclusively when she is standing up or walking around. She had 1 major event which resulted in the patient being taken back to the emergency room. The spell was witnessed by her grandson order. Grandmother reports that the patient was up moving around getting coffee when she reported not feeling well. She became unresponsive, staring and suddenly fell to the ground. She lost consciousness for about 3 minutes. The granddaughter noted that she was shaking involving the left upper extremity. No oral trauma, urinary incontinence or bowel incontinence is reported. Patient is amnestic to the event. No focal neurological symptoms are reported. Again, the family who were present at the bedside reports that she has had about for these events since leaving the hospital. The tongue me that ever since her neck surgery a few years ago, she has had these events but over the last several weeks they're occurring at increasing frequency. Patient does not report any dyspnea or chest pain. There  is no GI GU symptoms at this time. She continues to complain repeatedly of significant neck pain. The neck pain again has been a long-standing issue. Review of systems otherwise negative.  GENERAL: This is a pleasant female who is currently in no acute distress.  HEENT: Supple. Atraumatic normocephalic.   ABDOMEN: soft  EXTREMITIES: No edema   BACK: Normal.  SKIN: Normal by inspection.    MENTAL STATUS: Alert and oriented. Speech, language and cognition are generally intact. Judgment and insight normal.   CRANIAL NERVES: Pupils are equal, round and reactive to light and accommodation; extra ocular movements are full, there is no significant nystagmus; visual fields are full; upper and lower facial muscles are normal in strength and symmetric, there is no flattening of the nasolabial folds; tongue is midline; uvula is midline; shoulder elevation is normal.  MOTOR: Normal tone, bulk and strength; no pronator drift.  COORDINATION: Left finger to nose is normal, right finger to nose is normal, No rest tremor; no intention tremor; no postural tremor; no bradykinesia.  REFLEXES: Deep tendon reflexes are symmetrical and normal. Babinski reflexes are flexor bilaterally.   SENSATION: Normal to light touch.      Past Medical History  Diagnosis Date  . Hypertension   . Hyperlipidemia   . Hypothyroidism   . GERD (gastroesophageal reflux disease)   . PONV (postoperative nausea and vomiting)     Did not have this problem- 2010  . Orthostatic hypotension   . Shortness of breath     with exertion  . Arthritis     finger   . Current use of  estrogen therapy 03/25/2013    Past Surgical History  Procedure Laterality Date  . Partial hysterectomy    . Neck surgery      secondary to ruptured disc  . Colonoscopy  12/25/2010    Procedure: COLONOSCOPY;  Surgeon: Rogene Houston, MD;  Location: [facility];  Service: Endoscopy;  Laterality: N/A;  . Cardiac catheterization      x 2 at  [facility] "nothing showed up"  . Abdominal hysterectomy    . Wrist fusion      Right- torn ligament  . Posterior cervical fusion/foraminotomy  12/18/2011    Procedure: POSTERIOR CERVICAL FUSION/FORAMINOTOMY LEVEL 2;  Surgeon: Hosie Spangle, MD;  Location: [place] [facility];  Service: Neurosurgery;  Laterality: Bilateral;  Cervical four to six Posterior cervical arthrodesis    Family History  Problem Relation Age of Onset  . Colon cancer Mother   . Cancer Mother     lung  . Diabetes Father   . Cancer Brother     back    Social History:  reports that she has never smoked. She has never used smokeless tobacco. She reports that she does not drink alcohol or use illicit drugs.  Allergies:  Allergies  Allergen Reactions  . Codeine Nausea And Vomiting  . Other Nausea And Vomiting    Most anesthesia   . Latex Rash    Medications: Prior to Admission medications   Medication Sig Start Date End Date Taking? Authorizing Provider  amLODipine (NORVASC) 5 MG tablet Take 5 mg by mouth at bedtime.  01/11/11  Yes Historical Provider, MD  aspirin 81 MG tablet Take 81 mg by mouth at bedtime.    Yes Historical Provider, MD  calcium-vitamin D (OSCAL WITH D) 500-200 MG-UNIT per tablet Take 1 tablet by mouth at bedtime.    Yes Historical Provider, MD  clopidogrel (PLAVIX) 75 MG tablet Take 1 tablet (75 mg total) by mouth daily. 12/07/13  Yes Alonza Bogus, MD  HYDROcodone-acetaminophen (NORCO/VICODIN) 5-325 MG per tablet Take 1 tablet by mouth every 4 (four) hours as needed for pain. 08/05/12  Yes Hoy Morn, MD  levothyroxine (SYNTHROID, LEVOTHROID) 75 MCG tablet Take 75 mcg by mouth daily.     Yes Historical Provider, MD  multivitamin-iron-minerals-folic acid (CENTRUM) chewable tablet Chew 1 tablet by mouth at bedtime.    Yes Historical Provider, MD  naproxen sodium (ANAPROX) 220 MG tablet Take 440 mg by mouth 2 (two) times daily as needed. For pain   Yes Historical Provider, MD  pravastatin  (PRAVACHOL) 20 MG tablet Take 1 tablet (20 mg total) by mouth daily. 12/07/13  Yes Alonza Bogus, MD  VIVELLE-DOT 0.05 MG/24HR Place 1 patch onto the skin once a week.  04/24/10  Yes Historical Provider, MD    Scheduled Meds: . amLODipine  5 mg Oral QHS  . aspirin EC  81 mg Oral QHS  . clopidogrel  75 mg Oral Daily  . [START ON 12/09/2013] levothyroxine  75 mcg Oral QAC breakfast  . simvastatin  10 mg Oral q1800   Continuous Infusions: . sodium chloride     PRN Meds:.acetaminophen, acetaminophen   Blood pressure 170/64, pulse 72, temperature 98.1 F (36.7 C), temperature source Oral, resp. rate 18, height 5' 5" (1.651 m), weight 68 kg (149 lb 14.6 oz), SpO2 99.00%.   Results for orders placed during the hospital encounter of 12/08/13 (from the past 48 hour(s))  CBC WITH DIFFERENTIAL     Status: None   Collection  Time    12/08/13  9:17 AM      Result Value Ref Range   WBC 4.9  4.0 - 10.5 K/uL   RBC 4.54  3.87 - 5.11 MIL/uL   Hemoglobin 14.1  12.0 - 15.0 g/dL   HCT 39.9  36.0 - 46.0 %   MCV 87.9  78.0 - 100.0 fL   MCH 31.1  26.0 - 34.0 pg   MCHC 35.3  30.0 - 36.0 g/dL   RDW 12.7  11.5 - 15.5 %   Platelets 196  150 - 400 K/uL   Neutrophils Relative % 65  43 - 77 %   Neutro Abs 3.3  1.7 - 7.7 K/uL   Lymphocytes Relative 22  12 - 46 %   Lymphs Abs 1.1  0.7 - 4.0 K/uL   Monocytes Relative 10  3 - 12 %   Monocytes Absolute 0.5  0.1 - 1.0 K/uL   Eosinophils Relative 2  0 - 5 %   Eosinophils Absolute 0.1  0.0 - 0.7 K/uL   Basophils Relative 1  0 - 1 %   Basophils Absolute 0.0  0.0 - 0.1 K/uL  COMPREHENSIVE METABOLIC PANEL     Status: Abnormal   Collection Time    12/08/13  9:17 AM      Result Value Ref Range   Sodium 141  137 - 147 mEq/L   Potassium 4.3  3.7 - 5.3 mEq/L   Chloride 104  96 - 112 mEq/L   CO2 25  19 - 32 mEq/L   Glucose, Bld 105 (*) 70 - 99 mg/dL   BUN 16  6 - 23 mg/dL   Creatinine, Ser 0.87  0.50 - 1.10 mg/dL   Calcium 9.6  8.4 - 10.5 mg/dL   Total  Protein 7.4  6.0 - 8.3 g/dL   Albumin 4.2  3.5 - 5.2 g/dL   AST 23  0 - 37 U/L   ALT 15  0 - 35 U/L   Alkaline Phosphatase 93  39 - 117 U/L   Total Bilirubin 0.5  0.3 - 1.2 mg/dL   GFR calc non Af Amer 68 (*) >90 mL/min   GFR calc Af Amer 79 (*) >90 mL/min   Comment: (NOTE)     The eGFR has been calculated using the CKD EPI equation.     This calculation has not been validated in all clinical situations.     eGFR's persistently <90 mL/min signify possible Chronic Kidney     Disease.   Anion gap 12  5 - 15  TROPONIN I     Status: None   Collection Time    12/08/13  9:17 AM      Result Value Ref Range   Troponin I <0.30  <0.30 ng/mL   Comment:            Due to the release kinetics of cTnI,     a negative result within the first hours     of the onset of symptoms does not rule out     myocardial infarction with certainty.     If myocardial infarction is still suspected,     repeat the test at appropriate intervals.  D-DIMER, QUANTITATIVE     Status: None   Collection Time    12/08/13 10:26 AM      Result Value Ref Range   D-Dimer, Quant 0.48  0.00 - 0.48 ug/mL-FEU   Comment:  AT THE INHOUSE ESTABLISHED CUTOFF     VALUE OF 0.48 ug/mL FEU,     THIS ASSAY HAS BEEN DOCUMENTED     IN THE LITERATURE TO HAVE     A SENSITIVITY AND NEGATIVE     PREDICTIVE VALUE OF AT LEAST     98 TO 99%.  THE TEST RESULT     SHOULD BE CORRELATED WITH     AN ASSESSMENT OF THE CLINICAL     PROBABILITY OF DVT / VTE.    Ct Head Wo Contrast  12/08/2013   CLINICAL DATA:  Weakness, speech abnormality, diaphoretic, fatigue  EXAM: CT HEAD WITHOUT CONTRAST  TECHNIQUE: Contiguous axial images were obtained from the base of the skull through the vertex without contrast.  COMPARISON:  None  FINDINGS: Normal appearance of the intracranial structures. No evidence for acute hemorrhage, mass lesion, midline shift, hydrocephalus or large infarct. No acute bony abnormality. The visualized sinuses are clear.   IMPRESSION: No acute intracranial abnormality.   Electronically Signed   By: Daryll Brod M.D.   On: 12/08/2013 11:32   Mr Virgel Paling Wo Contrast  12/06/2013   CLINICAL DATA:  68 year old female with weakness and syncope. TIA. Initial encounter.  EXAM: MRA HEAD WITHOUT CONTRAST  TECHNIQUE: Angiographic images of the Circle of Willis were obtained using MRA technique without intravenous contrast.  COMPARISON:  Brain MRI 12/05/2013.  FINDINGS: Antegrade flow in the posterior circulation with dominant distal left vertebral artery. Normal right PICA and dominant appearing left AICA origins. No basilar stenosis. SCA and PCA origins are normal. Posterior communicating arteries are diminutive or absent. Bilateral PCA branches are within normal limits.  Antegrade flow in both ICA siphons. Mild siphon irregularity with no stenosis. Ophthalmic artery origins are within normal limits. Normal carotid termini. Normal MCA and ACA origins. Normal anterior communicating artery and visualized bilateral ACA branches. Visualized bilateral MCA branches are within normal limits.  IMPRESSION: Negative intracranial MRA.   Electronically Signed   By: Lars Pinks M.D.   On: 12/06/2013 21:00   Dg Chest Portable 1 View  12/08/2013   CLINICAL DATA:  Generalized weakness  EXAM: PORTABLE CHEST - 1 VIEW  COMPARISON:  12/12/2011  FINDINGS: Cardiomediastinal silhouette is unremarkable. No acute infiltrate or pleural effusion. No pulmonary edema. Bony thorax is unremarkable.  IMPRESSION: No active disease.   Electronically Signed   By: Lahoma Crocker M.D.   On: 12/08/2013 10:23        Elizah Lydon A. Merlene Laughter, M.D.  Diplomate, Tax adviser of Psychiatry and Neurology ( Neurology). 12/08/2013, 5:38 PM

## 2013-12-08 NOTE — ED Provider Notes (Signed)
CSN: [id number]     Arrival date & time 12/08/13  4098 History  This chart was scribed for Benny Lennert, MD by Leone Payor, ED Scribe. This patient was seen in room APA03/APA03 and the patient's care was started 9:04 AM.    Chief Complaint  Patient presents with  . Fatigue    Patient is a 68 y.o. female presenting with syncope. The history is provided by the patient. No language interpreter was used.  Loss of Consciousness Episode history:  Single Most recent episode:  Today Progression:  Resolved Chronicity:  New Context: sitting down   Witnessed: yes   Associated symptoms: diaphoresis and dizziness   Associated symptoms: no chest pain, no headaches and no seizures     HPI Comments: [name] is a 68 y.o. female who presents to the Emergency Department complaining of a syncopal episode that occurred this morning. Granddaughter states patient was sitting in a chair when she became diaphoretic, her arm began to "twitch", and she became verbally unresponsive prior to having LOC. Per granddaughter, patient was discharged yesterday after being hospitalized for a possible CVA. Patient denies current pain.   Past Medical History  Diagnosis Date  . Hypertension   . Hyperlipidemia   . Hypothyroidism   . GERD (gastroesophageal reflux disease)   . PONV (postoperative nausea and vomiting)     Did not have this problem- 2010  . Orthostatic hypotension   . Shortness of breath     with exertion  . Arthritis     finger   . Current use of estrogen therapy 03/25/2013   Past Surgical History  Procedure Laterality Date  . Partial hysterectomy    . Neck surgery      secondary to ruptured disc  . Colonoscopy  12/25/2010    Procedure: COLONOSCOPY;  Surgeon: Malissa Hippo, MD;  Location: [facility];  Service: Endoscopy;  Laterality: N/A;  . Cardiac catheterization      x 2 at [facility] "nothing showed up"  . Abdominal hysterectomy    . Wrist fusion      Right- torn ligament  .  Posterior cervical fusion/foraminotomy  12/18/2011    Procedure: POSTERIOR CERVICAL FUSION/FORAMINOTOMY LEVEL 2;  Surgeon: Hewitt Shorts, MD;  Location: [facility];  Service: Neurosurgery;  Laterality: Bilateral;  Cervical four to six Posterior cervical arthrodesis   Family History  Problem Relation Age of Onset  . Colon cancer Mother   . Cancer Mother     lung  . Diabetes Father   . Cancer Brother     back   History  Substance Use Topics  . Smoking status: Never Smoker   . Smokeless tobacco: Never Used  . Alcohol Use: No   OB History   Grav Para Term Preterm Abortions TAB SAB Ect Mult Living   1 1        1      Review of Systems  Constitutional: Positive for diaphoresis. Negative for appetite change and fatigue.  HENT: Negative for congestion, ear discharge and sinus pressure.   Eyes: Negative for discharge.  Respiratory: Negative for cough.   Cardiovascular: Positive for syncope. Negative for chest pain.  Gastrointestinal: Negative for abdominal pain and diarrhea.  Genitourinary: Negative for frequency and hematuria.  Musculoskeletal: Negative for back pain.  Skin: Negative for rash.  Neurological: Positive for dizziness. Negative for seizures and headaches.  Psychiatric/Behavioral: Negative for hallucinations.      Allergies  Codeine; Other; and Latex  Home  Medications   Prior to Admission medications   Medication Sig Start Date End Date Taking? Authorizing Provider  amLODipine (NORVASC) 5 MG tablet Take 5 mg by mouth every evening.  01/11/11   Historical Provider, MD  aspirin 81 MG tablet Take 81 mg by mouth every evening.     Historical Provider, MD  calcium-vitamin D (OSCAL WITH D) 500-200 MG-UNIT per tablet Take 1 tablet by mouth every evening.     Historical Provider, MD  clopidogrel (PLAVIX) 75 MG tablet Take 1 tablet (75 mg total) by mouth daily. 12/07/13   Fredirick Maudlin, MD  HYDROcodone-acetaminophen (NORCO/VICODIN) 5-325 MG per tablet Take 1 tablet  by mouth every 4 (four) hours as needed for pain. 08/05/12   Lyanne Co, MD  levothyroxine (SYNTHROID, LEVOTHROID) 75 MCG tablet Take 75 mcg by mouth daily.      Historical Provider, MD  multivitamin-iron-minerals-folic acid (CENTRUM) chewable tablet Chew 1 tablet by mouth every evening.     Historical Provider, MD  naproxen sodium (ANAPROX) 220 MG tablet Take 440 mg by mouth 2 (two) times daily as needed. For pain    Historical Provider, MD  pravastatin (PRAVACHOL) 20 MG tablet Take 1 tablet (20 mg total) by mouth daily. 12/07/13   Fredirick Maudlin, MD  VIVELLE-DOT 0.05 MG/24HR Place 1 patch onto the skin once a week.  04/24/10   Historical Provider, MD   BP 165/67  Pulse 82  Temp(Src) 97.4 F (36.3 C) (Oral)  Resp 18  Ht 5\' 5"  (1.651 m)  Wt 152 lb (68.947 kg)  BMI 25.29 kg/m2  SpO2 98% Physical Exam  Nursing note and vitals reviewed. Constitutional: She is oriented to person, place, and time. She appears well-developed.  HENT:  Head: Normocephalic.  Eyes: Conjunctivae and EOM are normal. No scleral icterus.  Neck: Neck supple. No thyromegaly present.  Cardiovascular: Normal rate and regular rhythm.  Exam reveals no gallop and no friction rub.   No murmur heard. Pulmonary/Chest: No stridor. She has no wheezes. She has no rales. She exhibits no tenderness.  Abdominal: She exhibits no distension. There is no tenderness. There is no rebound.  Musculoskeletal: Normal range of motion. She exhibits no edema.  Lymphadenopathy:    She has no cervical adenopathy.  Neurological: She is oriented to person, place, and time. She exhibits normal muscle tone. Coordination normal.  Skin: No rash noted. No erythema.  Psychiatric: She has a normal mood and affect. Her behavior is normal.    ED Course  Procedures (including critical care time)  DIAGNOSTIC STUDIES: Oxygen Saturation is 98% on RA, normal by my interpretation.    COORDINATION OF CARE: 9:07 AM Discussed treatment plan with pt at  bedside and pt agreed to plan.   Labs Review Labs Reviewed - No data to display  Imaging Review Mr Shirlee Latch Wo Contrast  12/06/2013   CLINICAL DATA:  68 year old female with weakness and syncope. TIA. Initial encounter.  EXAM: MRA HEAD WITHOUT CONTRAST  TECHNIQUE: Angiographic images of the Circle of Willis were obtained using MRA technique without intravenous contrast.  COMPARISON:  Brain MRI 12/05/2013.  FINDINGS: Antegrade flow in the posterior circulation with dominant distal left vertebral artery. Normal right PICA and dominant appearing left AICA origins. No basilar stenosis. SCA and PCA origins are normal. Posterior communicating arteries are diminutive or absent. Bilateral PCA branches are within normal limits.  Antegrade flow in both ICA siphons. Mild siphon irregularity with no stenosis. Ophthalmic artery origins are within normal limits.  Normal carotid termini. Normal MCA and ACA origins. Normal anterior communicating artery and visualized bilateral ACA branches. Visualized bilateral MCA branches are within normal limits.  IMPRESSION: Negative intracranial MRA.   Electronically Signed   By: Augusto GambleLee  Hall M.D.   On: 12/06/2013 21:00   [place] Carotid Bilateral  12/06/2013   CLINICAL DATA:  TIA  EXAM: BILATERAL CAROTID DUPLEX ULTRASOUND  TECHNIQUE: Wallace CullensGray scale imaging, color Doppler and duplex ultrasound were performed of bilateral carotid and vertebral arteries in the neck.  COMPARISON:  10/02/2008  FINDINGS: Criteria: Quantification of carotid stenosis is based on velocity parameters that correlate the residual internal carotid diameter with NASCET-based stenosis levels, using the diameter of the distal internal carotid lumen as the denominator for stenosis measurement.  The following velocity measurements were obtained:  RIGHT  ICA:  107 cm/sec  CCA:  73 cm/sec  SYSTOLIC ICA/CCA RATIO:  1.5  DIASTOLIC ICA/CCA RATIO:  ECA:  86 cm/sec  LEFT  ICA:  84 cm/sec  CCA:  82 cm/sec  SYSTOLIC ICA/CCA RATIO:  1.0   DIASTOLIC ICA/CCA RATIO:  ECA:  116 cm/sec  RIGHT CAROTID ARTERY: Little if any plaque.  Normal Doppler pattern.  RIGHT VERTEBRAL ARTERY:  Antegrade.  Normal Doppler pattern.  LEFT CAROTID ARTERY: Mild irregular plaque in the bulb. Low resistance internal carotid Doppler pattern.  LEFT VERTEBRAL ARTERY:  Antegrade.  Normal Doppler pattern.  IMPRESSION: Less than 50% stenosis in the right and left internal carotid arteries.   Electronically Signed   By: Maryclare BeanArt  Hoss M.D.   On: 12/06/2013 09:54     EKG Interpretation   Date/Time:  Thursday December 08 2013 08:37:59 EDT Ventricular Rate:  82 PR Interval:    QRS Duration: 136 QT Interval:  417 QTC Calculation: 487 R Axis:   53 Text Interpretation:  Accelerated junctional rhythm Left bundle branch  block Confirmed by Sheridyn Canino  MD, Jomarie LongsJOSEPH [phone]) on 12/08/2013 12:39:31 PM      MDM   Final diagnoses:  None    Spoke with dr. Juanetta GoslingHawkins and the pt will be admitted and reevaluated for syncope  The chart was scribed for me under my direct supervision.  I personally performed the history, physical, and medical decision making and all procedures in the evaluation of this patient.Benny Lennert.    Jef Futch L Dvonte Gatliff, MD 12/08/13 [phone]

## 2013-12-08 NOTE — ED Notes (Signed)
Pt rating pain in neck and shoulders at 10.  Notified edp and morphine ordered.

## 2013-12-08 NOTE — ED Notes (Signed)
Pt resting, eyes closed, spouse at bedside.

## 2013-12-08 NOTE — ED Notes (Addendum)
Released from hospital yesterday for possible CVA. Pt was sitting in a chair in the kitchen stating she could not walk and felt weak so granddaughter helped her to the floor. CBG of 112. Pt was diaphoretic when EMS arrived. Pt states she felt like she was going to pass out and couldn't get her breath. Pt states she still feels weak and her neck hurts a little. Pt states she got up to fix coffee and got numb all over and felt like she couldn't move.

## 2013-12-08 NOTE — H&P (Signed)
History and Physical  [name]:[id number] DOB: June 20, 1945 DOA: 12/08/2013  Referring physician: Dr. Clelia Schaumann in ED PCP: Fredirick Maudlin, MD   Chief Complaint: passed out, right leg weakness  HPI:  68 year old woman presented to the emergency department with a history of right leg weakness and syncope this morning. Initial evaluation was unremarkable, neurologic exam was nonfocal, and patient was referred for observation.  She was hospitalized 8/18-8/19 for report right-sided weakness. Extensive investigation was unremarkable including MRI brain, MRA head, carotid ultrasound and 2-D echocardiogram. Additionally EEG was negative. She was discharged aspirin Plavix and final diagnosis was TIA. Neurology also considered complex partial seizure, recurrent: and psychosomatic disorder in the differential.  She reports episodes of feeling strange yesterday after discharge. Today she walked into the kitchen, on her feet, perhaps a few minutes, make coffee, then developed generalized weakness, lightheadedness and a feeling like she was going to pass out. She was able to sit down in a chair. She does not recall whether she totally passed out or not. She called her granddaughter who came in, call 911 in late the patient on the floor. Granddaughter notes patient had some gentle shaking of her left arm, some drooling and was not responsive. This lasted for perhaps 15 minutes. Patient was able to walk with EMS outside with assistance to the stretcher. Currently she has no paresthesias, focal weakness or complaints except for chronic neck pain.  After the initial interview and physical examination the patient been reported having right leg weakness this morning to the point that she had to drag her leg in order to sit down. She did not report this on initial questioning.  She reports a long-standing history of orthostatic hypotension and has had intermittent right leg weakness in the past with this. In  the past she had right leg weakness and was subsequently diagnosed with cervical disc disease and underwent surgery.  In the emergency department afebrile, vitals stable. Orthostatics grossly positive. Complete metabolic panel, troponin, CBC, d-dimer unremarkable. CAT scan of the head and chest x-ray unremarkable. EKG independently reviewed, poor quality but appears to be sinus rhythm, left bundle branch block old compared to previous study 12/25/2010. No acute changes seen.  Review of Systems:  Negative for fever, visual changes, sore throat, rash, new muscle aches, chest pain, SOB, dysuria, bleeding, n/v/abdominal pain. No paresthesias.  Past Medical History  Diagnosis Date  . Hypertension   . Hyperlipidemia   . Hypothyroidism   . GERD (gastroesophageal reflux disease)   . PONV (postoperative nausea and vomiting)     Did not have this problem- 2010  . Orthostatic hypotension   . Shortness of breath     with exertion  . Arthritis     finger   . Current use of estrogen therapy 03/25/2013    Past Surgical History  Procedure Laterality Date  . Partial hysterectomy    . Neck surgery      secondary to ruptured disc  . Colonoscopy  12/25/2010    Procedure: COLONOSCOPY;  Surgeon: Malissa Hippo, MD;  Location: [facility];  Service: Endoscopy;  Laterality: N/A;  . Cardiac catheterization      x 2 at [facility] "nothing showed up"  . Abdominal hysterectomy    . Wrist fusion      Right- torn ligament  . Posterior cervical fusion/foraminotomy  12/18/2011    Procedure: POSTERIOR CERVICAL FUSION/FORAMINOTOMY LEVEL 2;  Surgeon: Hewitt Shorts, MD;  Location: [facility];  Service: Neurosurgery;  Laterality: Bilateral;  Cervical four to six Posterior cervical arthrodesis    Social History:  reports that she has never smoked. She has never used smokeless tobacco. She reports that she does not drink alcohol or use illicit drugs.  Allergies  Allergen Reactions  . Codeine Nausea And  Vomiting  . Other Nausea And Vomiting    Most anesthesia   . Latex Rash    Family History  Problem Relation Age of Onset  . Colon cancer Mother   . Cancer Mother     lung  . Diabetes Father   . Cancer Brother     back     Prior to Admission medications   Medication Sig Start Date End Date Taking? Authorizing Provider  amLODipine (NORVASC) 5 MG tablet Take 5 mg by mouth at bedtime.  01/11/11  Yes Historical Provider, MD  aspirin 81 MG tablet Take 81 mg by mouth at bedtime.    Yes Historical Provider, MD  calcium-vitamin D (OSCAL WITH D) 500-200 MG-UNIT per tablet Take 1 tablet by mouth at bedtime.    Yes Historical Provider, MD  clopidogrel (PLAVIX) 75 MG tablet Take 1 tablet (75 mg total) by mouth daily. 12/07/13  Yes Fredirick MaudlinEdward L Hawkins, MD  HYDROcodone-acetaminophen (NORCO/VICODIN) 5-325 MG per tablet Take 1 tablet by mouth every 4 (four) hours as needed for pain. 08/05/12  Yes Lyanne CoKevin M Campos, MD  levothyroxine (SYNTHROID, LEVOTHROID) 75 MCG tablet Take 75 mcg by mouth daily.     Yes Historical Provider, MD  multivitamin-iron-minerals-folic acid (CENTRUM) chewable tablet Chew 1 tablet by mouth at bedtime.    Yes Historical Provider, MD  naproxen sodium (ANAPROX) 220 MG tablet Take 440 mg by mouth 2 (two) times daily as needed. For pain   Yes Historical Provider, MD  pravastatin (PRAVACHOL) 20 MG tablet Take 1 tablet (20 mg total) by mouth daily. 12/07/13  Yes Fredirick MaudlinEdward L Hawkins, MD  VIVELLE-DOT 0.05 MG/24HR Place 1 patch onto the skin once a week.  04/24/10  Yes Historical Provider, MD   Physical Exam: Filed Vitals:   12/08/13 1100 12/08/13 1130 12/08/13 1301 12/08/13 1359  BP: 159/68 159/65 152/56 173/64  Pulse:   71 70  Temp:      TempSrc:      Resp: 15 20 16 16   Height:      Weight:      SpO2:   99% 100%    General: Examined in emergency department. Appears calm and comfortable Eyes: PERRL, normal lids, irises  ENT: grossly normal hearing, lips & tongue Neck: no LAD, masses  or thyromegaly  Cardiovascular: RRR, no m/r/g. No LE edema. Respiratory: CTA bilaterally, no w/r/r. Normal respiratory effort. Abdomen: soft, ntnd Skin: no rash or induration seen Musculoskeletal: grossly normal tone BUE/BLE. Strength 5/5 all extremities, symmetric. There is giveaway strength all extremities. Psychiatric: grossly normal mood and affect, speech fluent and appropriate Neurologic: Cranial nerves 2-12 appear intact. No pronator drift. No upper extremity dysdiadochokinesis.  Wt Readings from Last 3 Encounters:  12/08/13 68.947 kg (152 lb)  12/05/13 68.72 kg (151 lb 8 oz)  03/25/13 68.72 kg (151 lb 8 oz)    Labs on Admission:  Basic Metabolic Panel:  Recent Labs Lab 12/05/13 1813 12/08/13 0917  NA 142 141  K 4.5 4.3  CL 102 104  CO2 28 25  GLUCOSE 102* 105*  BUN 22 16  CREATININE 0.83 0.87  CALCIUM 9.4 9.6    Liver Function Tests:  Recent Labs Lab 12/05/13 1813 12/08/13  0917  AST 27 23  ALT 19 15  ALKPHOS 89 93  BILITOT 0.3 0.5  PROT 7.6 7.4  ALBUMIN 4.3 4.2   CBC:  Recent Labs Lab 12/05/13 1813 12/08/13 0917  WBC 6.0 4.9  NEUTROABS 3.1 3.3  HGB 13.5 14.1  HCT 38.6 39.9  MCV 89.1 87.9  PLT 215 196    Cardiac Enzymes:  Recent Labs Lab 12/08/13 0917  TROPONINI <0.30     Radiological Exams on Admission: Ct Head Wo Contrast  12/08/2013   CLINICAL DATA:  Weakness, speech abnormality, diaphoretic, fatigue  EXAM: CT HEAD WITHOUT CONTRAST  TECHNIQUE: Contiguous axial images were obtained from the base of the skull through the vertex without contrast.  COMPARISON:  None  FINDINGS: Normal appearance of the intracranial structures. No evidence for acute hemorrhage, mass lesion, midline shift, hydrocephalus or large infarct. No acute bony abnormality. The visualized sinuses are clear.  IMPRESSION: No acute intracranial abnormality.   Electronically Signed   By: Ruel Favors M.D.   On: 12/08/2013 11:32   Mr Shirlee Latch Wo Contrast  12/06/2013    CLINICAL DATA:  68 year old female with weakness and syncope. TIA. Initial encounter.  EXAM: MRA HEAD WITHOUT CONTRAST  TECHNIQUE: Angiographic images of the Circle of Willis were obtained using MRA technique without intravenous contrast.  COMPARISON:  Brain MRI 12/05/2013.  FINDINGS: Antegrade flow in the posterior circulation with dominant distal left vertebral artery. Normal right PICA and dominant appearing left AICA origins. No basilar stenosis. SCA and PCA origins are normal. Posterior communicating arteries are diminutive or absent. Bilateral PCA branches are within normal limits.  Antegrade flow in both ICA siphons. Mild siphon irregularity with no stenosis. Ophthalmic artery origins are within normal limits. Normal carotid termini. Normal MCA and ACA origins. Normal anterior communicating artery and visualized bilateral ACA branches. Visualized bilateral MCA branches are within normal limits.  IMPRESSION: Negative intracranial MRA.   Electronically Signed   By: Augusto Gamble M.D.   On: 12/06/2013 21:00   Dg Chest Portable 1 View  12/08/2013   CLINICAL DATA:  Generalized weakness  EXAM: PORTABLE CHEST - 1 VIEW  COMPARISON:  12/12/2011  FINDINGS: Cardiomediastinal silhouette is unremarkable. No acute infiltrate or pleural effusion. No pulmonary edema. Bony thorax is unremarkable.  IMPRESSION: No active disease.   Electronically Signed   By: Natasha Mead M.D.   On: 12/08/2013 10:23     Principal Problem:   Syncope Active Problems:   TIA (transient ischemic attack)   Orthostatic hypotension   Assessment/Plan 1. Syncope 2. Orthostatic hypotension 3. Reported RLE weakness (resolved prior to admission), possible TIA. She takes ASA and Plavix at night, last took last night. 4. HTN, hyperlipidemia, hypothyroidism. TSH WNL on last admission. 5. Possible anxiety   No focal neurologic deficits at this time, appears stable for observation on telemetry unit. Extensive investigation within the last 4 days  will not be repeated. Etiology of reported signs and symptoms unclear, consider syncope secondary to orthostatic hypotension with brief encephalopathy and possible seizure-like activity versus TIA with transient right lower extremity weakness. Her history is not strongly suggestive of seizure, EEG on previous admission was unremarkable, therefore plan observation.  Given her history of cervical disc disease and 2 operations as well as reported lower extremity weakness at time of diagnosis of cervical this disease, will proceed with MRI neck. She has no focal deficits at this time and her neck pain is chronic.  Of note she was admitted 08/2010 for TIA at  which time cervical disk disease was discovered, but according to note not felt to explain all her symptoms.  Consult neurology  IVF, check orthostatics in AM  Code Status: full code  DVT prophylaxis:SCDs Family Communication: husband and grandaughter Disposition Plan/Anticipated LOS: obs, 24 hours  Time spent: 50 minutes  Brendia Sacks, MD  [facility] Pager [phone] 12/08/2013, 3:10 PM

## 2013-12-08 NOTE — ED Notes (Signed)
Pt reports was discharged from hospital yesterday for r/o stroke.  Reports has had 4 episodes of generalized weakness since being discharged.  This morning reports got up to fix coffee and while sitting at the table, became very weak, diaphoretic, spilled coffee and called for her grand daughter.  Grand daughter reports pt had a blank stare, was drooling, and left arm was shaking.  Reports lowered pt to the floor and pt became responsive.  Pt c/o pain in neck and generalized weakness.

## 2013-12-09 ENCOUNTER — Encounter (HOSPITAL_COMMUNITY): Payer: Self-pay | Admitting: Physician Assistant

## 2013-12-09 ENCOUNTER — Ambulatory Visit (HOSPITAL_COMMUNITY): Payer: Self-pay

## 2013-12-09 DIAGNOSIS — I10 Essential (primary) hypertension: Secondary | ICD-10-CM

## 2013-12-09 DIAGNOSIS — E039 Hypothyroidism, unspecified: Secondary | ICD-10-CM

## 2013-12-09 DIAGNOSIS — I447 Left bundle-branch block, unspecified: Secondary | ICD-10-CM

## 2013-12-09 DIAGNOSIS — R55 Syncope and collapse: Secondary | ICD-10-CM

## 2013-12-09 MED ORDER — STROKE: EARLY STAGES OF RECOVERY BOOK
Freq: Once | Status: AC
  Administered 2013-12-09: 12:00:00
  Filled 2013-12-09: qty 1

## 2013-12-09 NOTE — Care Management Note (Addendum)
    Page 1 of 1   12/13/2013     2:56:45 PM CARE MANAGEMENT NOTE 12/13/2013  Patient:  [name],[name]   Account Number:  [id number]  Date Initiated:  12/09/2013  Documentation initiated by:  Kathyrn SheriffHILDRESS,JESSICA  Subjective/Objective Assessment:   Pt admitted obs after experiencing weakness at home. Pt arranged for OP PT at previous admission. Pt is from home with self care. Pt has no HH, DME or medication needs prior to admission.     Action/Plan:   pt plans to discharge home with self care over weekend. Pt will resume OP PT services at discharge. No CM needs at this tiime.   Anticipated DC Date:  12/13/2013   Anticipated DC Plan:  HOME/SELF CARE      DC Planning Services  CM consult      Choice offered to / List presented to:             Status of service:  Completed, signed off Medicare Important Message given?  YES (If response is "NO", the following Medicare IM given date fields will be blank) Date Medicare IM given:  12/13/2013 Medicare IM given by:  Kathyrn SheriffHILDRESS,JESSICA Date Additional Medicare IM given:   Additional Medicare IM given by:    Discharge Disposition:  HOME/SELF CARE  Per UR Regulation:    If discussed at Long Length of Stay Meetings, dates discussed:    Comments:  12/13/2013 0900 Kathyrn SheriffJessica Childress, RN, MSN, PCCN Pt plans to discharge home today with self care. Pt has previous OP PT referral pending that will resume after discharge. Pt has no CM needs at this time.  12/09/2013 1400 Kathyrn SheriffJessica Childress, RN, MSN, [facility]

## 2013-12-09 NOTE — Progress Notes (Signed)
PT Cancellation Note  Patient Details Name: [name] MRN: [id number] DOB: [birth year]   Cancelled Treatment:    Reason Eval/Treat Not Completed: PT screened, no needs identified, will sign off.  Pt was origianally evaluated by PT at last hospital stay with recommendation for OOPT, however before pt able to begin services pt was readmitted to the hospital with episode of weakness.  No weakness noted at this time.  Pt able to  complete bed mobility skills (I), transfers mod (I), and amb skills supervision/mod (I).  Noted decreased arm swing, and mild decrease in step length, though no LOB noted.  Pt would benefit from skilled OOPT services to address gait deviations and increase activity tolerance/improve community mobility skills.   Pt to be d/c from acute PT services.    Dayton Sherr 12/09/2013, 9:44 AM

## 2013-12-09 NOTE — Progress Notes (Signed)
Subjective: She was readmitted with what appears to be syncope due to orthostasis. When she was in the hospital earlier this week she had orthostatic hypotension and was sent home with compression stockings with plans for close followup. However she had syncopal episode before that can be accomplished.  Objective: Vital signs in last 24 hours: Temp:  [97 F (36.1 C)-98.1 F (36.7 C)] 97.8 F (36.6 C) (08/21 0553) Pulse Rate:  [66-82] 68 (08/21 0553) Resp:  [15-20] 18 (08/21 0553) BP: (149-179)/(51-68) 179/64 mmHg (08/21 0553) SpO2:  [98 %-100 %] 98 % (08/21 0553) Weight:  [68 kg (149 lb 14.6 oz)-68.947 kg (152 lb)] 68 kg (149 lb 14.6 oz) (08/20 1610) Weight change:  Last BM Date: 12/06/13  Intake/Output from previous day: 08/20 0701 - 08/21 0700 In: -  Out: 400 [Urine:400]  PHYSICAL EXAM General appearance: alert, cooperative and mild distress Resp: clear to auscultation bilaterally Cardio: regular rate and rhythm, S1, S2 normal, no murmur, click, rub or gallop GI: soft, non-tender; bowel sounds normal; no masses,  no organomegaly Extremities: extremities normal, atraumatic, no cyanosis or edema  Lab Results:  Results for orders placed during the hospital encounter of 12/08/13 (from the past 48 hour(s))  CBC WITH DIFFERENTIAL     Status: None   Collection Time    12/08/13  9:17 AM      Result Value Ref Range   WBC 4.9  4.0 - 10.5 K/uL   RBC 4.54  3.87 - 5.11 MIL/uL   Hemoglobin 14.1  12.0 - 15.0 g/dL   HCT 39.9  36.0 - 46.0 %   MCV 87.9  78.0 - 100.0 fL   MCH 31.1  26.0 - 34.0 pg   MCHC 35.3  30.0 - 36.0 g/dL   RDW 12.7  11.5 - 15.5 %   Platelets 196  150 - 400 K/uL   Neutrophils Relative % 65  43 - 77 %   Neutro Abs 3.3  1.7 - 7.7 K/uL   Lymphocytes Relative 22  12 - 46 %   Lymphs Abs 1.1  0.7 - 4.0 K/uL   Monocytes Relative 10  3 - 12 %   Monocytes Absolute 0.5  0.1 - 1.0 K/uL   Eosinophils Relative 2  0 - 5 %   Eosinophils Absolute 0.1  0.0 - 0.7 K/uL    Basophils Relative 1  0 - 1 %   Basophils Absolute 0.0  0.0 - 0.1 K/uL  COMPREHENSIVE METABOLIC PANEL     Status: Abnormal   Collection Time    12/08/13  9:17 AM      Result Value Ref Range   Sodium 141  137 - 147 mEq/L   Potassium 4.3  3.7 - 5.3 mEq/L   Chloride 104  96 - 112 mEq/L   CO2 25  19 - 32 mEq/L   Glucose, Bld 105 (*) 70 - 99 mg/dL   BUN 16  6 - 23 mg/dL   Creatinine, Ser 0.87  0.50 - 1.10 mg/dL   Calcium 9.6  8.4 - 10.5 mg/dL   Total Protein 7.4  6.0 - 8.3 g/dL   Albumin 4.2  3.5 - 5.2 g/dL   AST 23  0 - 37 U/L   ALT 15  0 - 35 U/L   Alkaline Phosphatase 93  39 - 117 U/L   Total Bilirubin 0.5  0.3 - 1.2 mg/dL   GFR calc non Af Amer 68 (*) >90 mL/min   GFR calc Af Amer 79 (*) >  90 mL/min   Comment: (NOTE)     The eGFR has been calculated using the CKD EPI equation.     This calculation has not been validated in all clinical situations.     eGFR's persistently <90 mL/min signify possible Chronic Kidney     Disease.   Anion gap 12  5 - 15  TROPONIN I     Status: None   Collection Time    12/08/13  9:17 AM      Result Value Ref Range   Troponin I <0.30  <0.30 ng/mL   Comment:            Due to the release kinetics of cTnI,     a negative result within the first hours     of the onset of symptoms does not rule out     myocardial infarction with certainty.     If myocardial infarction is still suspected,     repeat the test at appropriate intervals.  D-DIMER, QUANTITATIVE     Status: None   Collection Time    12/08/13 10:26 AM      Result Value Ref Range   D-Dimer, Quant 0.48  0.00 - 0.48 ug/mL-FEU   Comment:            AT THE INHOUSE ESTABLISHED CUTOFF     VALUE OF 0.48 ug/mL FEU,     THIS ASSAY HAS BEEN DOCUMENTED     IN THE LITERATURE TO HAVE     A SENSITIVITY AND NEGATIVE     PREDICTIVE VALUE OF AT LEAST     98 TO 99%.  THE TEST RESULT     SHOULD BE CORRELATED WITH     AN ASSESSMENT OF THE CLINICAL     PROBABILITY OF DVT / VTE.    ABGS No results  found for this basename: PHART, PCO2, PO2ART, TCO2, HCO3,  in the last 72 hours CULTURES No results found for this or any previous visit (from the past 240 hour(s)). Studies/Results: Ct Head Wo Contrast  12/08/2013   CLINICAL DATA:  Weakness, speech abnormality, diaphoretic, fatigue  EXAM: CT HEAD WITHOUT CONTRAST  TECHNIQUE: Contiguous axial images were obtained from the base of the skull through the vertex without contrast.  COMPARISON:  None  FINDINGS: Normal appearance of the intracranial structures. No evidence for acute hemorrhage, mass lesion, midline shift, hydrocephalus or large infarct. No acute bony abnormality. The visualized sinuses are clear.  IMPRESSION: No acute intracranial abnormality.   Electronically Signed   By: Daryll Brod M.D.   On: 12/08/2013 11:32   Mr Cervical Spine Wo Contrast  12/08/2013   CLINICAL DATA:  Transient right lower extremity weakness. Intermittent right facial and right arm numbness. Syncopal episode. Prior cervical spine surgery.  EXAM: MRI CERVICAL SPINE WITHOUT CONTRAST  TECHNIQUE: Multiplanar, multisequence MR imaging of the cervical spine was performed. No intravenous contrast was administered.  COMPARISON:  11/27/2011  FINDINGS: Straightening of the normal cervical lordosis is unchanged. There is no listhesis. Sequelae of C4-C6 ACDF are again identified. Since the prior MRI, the patient has undergone C4-C6 posterior fusion. Vertebral bone marrow signal is slightly diminished diffusely and mildly heterogeneous diffusely. Focal areas of fatty marrow signal/ small hemangiomas are again seen in the C7 -T2 vertebral bodies. There is no evidence of cervical vertebral bone marrow edema. Vertebral body heights are preserved. Craniocervical junction is unremarkable. Cervical spinal cord is normal in caliber and signal. Paraspinal soft tissues are unremarkable.  C2-3:  Negative.  C3-4:  Negative.  C4-5: Prior anterior and posterior fusion without evidence of stenosis.   C5-6: Prior anterior and posterior fusion without evidence of stenosis.  C6-7: Small central disc protrusion with annular fissure without stenosis, similar to prior.  C7-T1:  Negative.  IMPRESSION: Prior C4-C6 anterior and posterior fusion.  No stenosis.   Electronically Signed   By: Logan Bores   On: 12/08/2013 18:54   Dg Chest Portable 1 View  12/08/2013   CLINICAL DATA:  Generalized weakness  EXAM: PORTABLE CHEST - 1 VIEW  COMPARISON:  12/12/2011  FINDINGS: Cardiomediastinal silhouette is unremarkable. No acute infiltrate or pleural effusion. No pulmonary edema. Bony thorax is unremarkable.  IMPRESSION: No active disease.   Electronically Signed   By: Lahoma Crocker M.D.   On: 12/08/2013 10:23    Medications:  Prior to Admission:  Prescriptions prior to admission  Medication Sig Dispense Refill  . amLODipine (NORVASC) 5 MG tablet Take 5 mg by mouth at bedtime.       Marland Kitchen aspirin 81 MG tablet Take 81 mg by mouth at bedtime.       . calcium-vitamin D (OSCAL WITH D) 500-200 MG-UNIT per tablet Take 1 tablet by mouth at bedtime.       . clopidogrel (PLAVIX) 75 MG tablet Take 1 tablet (75 mg total) by mouth daily.  30 tablet  12  . HYDROcodone-acetaminophen (NORCO/VICODIN) 5-325 MG per tablet Take 1 tablet by mouth every 4 (four) hours as needed for pain.  10 tablet  0  . levothyroxine (SYNTHROID, LEVOTHROID) 75 MCG tablet Take 75 mcg by mouth daily.        . multivitamin-iron-minerals-folic acid (CENTRUM) chewable tablet Chew 1 tablet by mouth at bedtime.       . naproxen sodium (ANAPROX) 220 MG tablet Take 440 mg by mouth 2 (two) times daily as needed. For pain      . pravastatin (PRAVACHOL) 20 MG tablet Take 1 tablet (20 mg total) by mouth daily.  30 tablet  12  . VIVELLE-DOT 0.05 MG/24HR Place 1 patch onto the skin once a week.        Scheduled: . amLODipine  5 mg Oral QHS  . aspirin EC  81 mg Oral QHS  . clopidogrel  75 mg Oral Daily  . levothyroxine  75 mcg Oral QAC breakfast  . midodrine  5  mg Oral TID WC  . simvastatin  10 mg Oral q1800   Continuous: . sodium chloride 75 mL/hr at 12/09/13 6063   KZS:[id number], acetaminophen  Assesment: She was admitted earlier in the week with what appears to have been a TIA. She is on aspirin Plavix and simvastatin. She has hypertension at baseline and has been on amlodipine. However she has significant orthostatic hypotension and had syncope from that. She has been started on ProAmatine. Orthostatics will be repeated. I told her she's going to have to stay in the hospital until we are certain it's safe for her to go home as far as her drop in blood pressure. She is not known to have any autonomic problem. She had pain in her neck and has had cervical fusion but MRI looked okay. She is not diabetic. Principal Problem:   Syncope Active Problems:   TIA (transient ischemic attack)   Orthostatic hypotension    Plan: She will have cortisol levels done to be sure not missing adrenal insufficiency although I think is pretty unlikely. Cardiology consultation for help with controlling her baseline blood pressure problems.  LOS: 1 day   Clever Geraldo L 12/09/2013, 8:34 AM

## 2013-12-09 NOTE — Consult Note (Signed)
Cardiology Consultation Note  Patient ID: [name], MRN: [id number], DOB/AGE: 1945/11/27 68 y.o. Admit date: 12/08/2013   Date of Consult: 12/09/2013 Primary Physician: Fredirick Maudlin, MD Primary Cardiologist: Remotely seen by Dr. Diona Browner for evaluation of asymptomatic LBBB in 2012  Chief Complaint:Passed out, right leg weakness Reason for Consult: Syncope, orthostasis  HPI: [name] is a 68 y/o F with history of HTN, HL, prior asymptomatic LBBB, hypothyroidism, orthostatic hypotension, and recent admission for suspected TIA who presented back to [facility] for an episode of syncope.  She was admitted 8/17-8/19 for right sided weakness, right arm numbness, and numbness in her tongue. During that admission, studies include carotid doppler <50% R/LICA, 2D echo EF 55-60% with grade 2 d/d, mild LAE, MAC with trivial MR, small pericardial effusion adjacent to free RV wall. MRI/MRA were nonacute, normal EEG. Neurology also considered complex partial seizure, recurrent: and psychosomatic disorder in the differential. Final D/C diagnosis was TIA and she was discharged on aspirin and Plavix. She also had reports of orthostasis during that admission.  She presented to [facility] 12/08/2013 with syncope. She has had episodes of feeling strange since discharge. On Wednesday while walking her dog she noticed she was almost having to drag her right leg. Yesterday morning, however, she felt great. She got up as usual, felt well, and while in the kitchen fixing a cup of coffee, she noticed she couldn't hold onto it without her right hand shaking. She put the coffee down and attempted to pick it up again but her right hand was shaking. She then believes she felt strange and called for [name], her granddaughter. She was able to sit down in the chair nearby. When her granddaughter came in the patient seemed to lose responsiveness and was staring straight ahead, then began drooling and sweating profusely. She had tremors of  her left arm. She then closed her eyes. 911 was called. The whole episode of unresponsiveness lasted about 4 minutes and the patient came to upon begin helped to the ground to lay down. She remembers feeling like she was going to be dropped. When she came to, she was still a little confused. No b/b incontinence. EMS VS unclear but admit VS in ED - P 82, BP 165/67. She denies any CP, SOB, bleeding issues, prior history of seizures.  . This admission, orthostatics were positive: Lying - BP 173/64 P 70; Sitting - BP 151/70 P 77; Standing 112/60 P 88. There is also documentation from yesterday evening of a BP drop from 136/56 at 2025 to 131/56 at 2029 then 75/45 at 2035. Orthostatics this AM: Lying BP 174/76 P 75; Sitting 160/74 P 83, Standing 0 min 123/61 P 95, Standing 3 min 126/69 P 98 - with associated wooziness per nursing. She was started on midodrine 5mg  TID. CT head nonacute and CXR unremarkable. EKG NSR LBBB unchanged from prior. Given her history of cervical disc disease s/p 2 operations, neck pain, as well as reported lower extremity weakness at time of diagnosis of cervical this disease, MRI neck was obtained without stenosis. TSH 4.1. D-dimer negative. Labs otherwise unremarkable. Dr. Juanetta Gosling says he is going to test her for adrenal insufficiency.   Past Medical History  Diagnosis Date  . Hypertension   . Hyperlipidemia   . Hypothyroidism   . GERD (gastroesophageal reflux disease)   . PONV (postoperative nausea and vomiting)     Did not have this problem- 2010  . Orthostatic hypotension   . Arthritis  finger   . Current use of estrogen therapy 03/25/2013  . LBBB (left bundle branch block)   . H/O cardiac catheterization     a. 2011: normal coronary arteries (false positive stress test).      Most Recent Cardiac Studies: DATE OF PROCEDURE:  07/26/2009 DATE OF DISCHARGE:  07/26/2009 CARDIAC CATHETERIZATION [name] is a 68 year old female with a history of  hypertension, dyslipidemia, and normal coronaries by cath that I performed in 2008 after she had chest pain and positive Myoview.  She saw Dr. Ranell Patrick with new anterolateral T-wave inversion, septal Q waves, and inferoapical perfusion abnormality on stress testing with complaints of increasing dyspnea.  She presents now for diagnostic coronary angiography via the right radial approach to define anatomy and rule out an ischemic etiology.  DESCRIPTION OF PROCEDURE:  The patient was brought to the Second [facility] Cardiac Cath Lab in a postabsorptive state.  She was premedicated with p.o. Valium, IV Versed, and fentanyl.  Her right wrist was prepped and shaved in usual sterile fashion.  Xylocaine 1% was used for local anesthesia.  A 5-French sheath was inserted to the right radial artery using standard back wall pullback technique.  Total of 10 mL of antispasmodic cocktail was administered through the side-arm sheath throughout the case.  The patient received 3000 units of heparin intravenously.  Visipaque dye was used for entirety of the case. Retrograde aortic, left ventricular, and pullback pressures were recorded.  A 5-French Tiger catheter and pigtail catheters were used. HEMODYNAMIC DATA: 1. Aortic systolic pressure 155, diastolic pressure 71. 2. Left ventricular systolic pressure 150, end-diastolic pressure 16. SELECTIVE CORONARY ANGIOGRAPHY: 1. Left main normal. 2. LAD normal with moderate irregularities in midportion. 3. Left circumflex; normal. 4. Right coronary artery dominant and normal. 5. Left ventriculography; RAO left ventriculogram was performed using     25 mL of Visipaque dye at 12 mL per second.  The overall LVEF was     estimated greater than 60% without focal wall motion abnormalities. IMPRESSION:  [name] has essentially normal coronary arteries and normal left ventricular function.  Her symptoms are noncardiac and her Myoview false positive.  The TR  band was placed on her wrist to achieve hemostasis and the sheath was removed.  The patient left the lab in stable condition.  The band will be gradually removed in the next 3 hours.  The patient was discharged home.  She will see Dr. Garen Lah back in 1-2 weeks for followup. Nanetta Batty, M.D.  2D Echo 12/06/13 Study Conclusions - Left ventricle: The cavity size was normal. Wall thickness was increased in a pattern of mild LVH. Systolic function was normal. The estimated ejection fraction was in the range of 55% to 60%. Wall motion was normal; there were no regional wall motion abnormalities. Features are consistent with a pseudonormal left ventricular filling pattern, with concomitant abnormal relaxation and increased filling pressure (grade 2 diastolic dysfunction). - Aortic valve: Mildly calcified annulus. Trileaflet. - Mitral valve: Calcified annulus. There was trivial regurgitation. - Left atrium: The atrium was mildly dilated. - Right atrium: Central venous pressure (est): 3 mm Hg. - Atrial septum: No defect or patent foramen ovale was identified. - Tricuspid valve: There was trivial regurgitation. - Pulmonary arteries: Systolic pressure could not be accurately estimated. - Pericardium, extracardiac: A small pericardial effusion was identified along the right ventricular free wall. Impressions:  - Mild LVH with LVEF 55-60%, grade 2 diastolic dysfunction. Mild left atrial enlargement. MAC with trivial mitral  regurgitation. Unable to assess PASP. No obvious PFO or ASD. Small pericardial effusion adjacent to RV free wall.   Surgical History:  Past Surgical History  Procedure Laterality Date  . Partial hysterectomy    . Neck surgery      secondary to ruptured disc  . Colonoscopy  12/25/2010    Procedure: COLONOSCOPY;  Surgeon: Malissa Hippo, MD;  Location: [facility]ITE;  Service: Endoscopy;  Laterality: N/A;  . Cardiac catheterization      x 2 at [facility] "nothing showed  up"  . Abdominal hysterectomy    . Wrist fusion      Right- torn ligament  . Posterior cervical fusion/foraminotomy  12/18/2011    Procedure: POSTERIOR CERVICAL FUSION/FORAMINOTOMY LEVEL 2;  Surgeon: Hewitt Shorts, MD;  Location: [facility] NEURO ORS;  Service: Neurosurgery;  Laterality: Bilateral;  Cervical four to six Posterior cervical arthrodesis     Home Meds: Prior to Admission medications   Medication Sig Start Date End Date Taking? Authorizing Provider  amLODipine (NORVASC) 5 MG tablet Take 5 mg by mouth at bedtime.  01/11/11  Yes Historical Provider, MD  aspirin 81 MG tablet Take 81 mg by mouth at bedtime.    Yes Historical Provider, MD  calcium-vitamin D (OSCAL WITH D) 500-200 MG-UNIT per tablet Take 1 tablet by mouth at bedtime.    Yes Historical Provider, MD  clopidogrel (PLAVIX) 75 MG tablet Take 1 tablet (75 mg total) by mouth daily. 12/07/13  Yes Fredirick Maudlin, MD  HYDROcodone-acetaminophen (NORCO/VICODIN) 5-325 MG per tablet Take 1 tablet by mouth every 4 (four) hours as needed for pain. 08/05/12  Yes Lyanne Co, MD  levothyroxine (SYNTHROID, LEVOTHROID) 75 MCG tablet Take 75 mcg by mouth daily.     Yes Historical Provider, MD  multivitamin-iron-minerals-folic acid (CENTRUM) chewable tablet Chew 1 tablet by mouth at bedtime.    Yes Historical Provider, MD  naproxen sodium (ANAPROX) 220 MG tablet Take 440 mg by mouth 2 (two) times daily as needed. For pain   Yes Historical Provider, MD  pravastatin (PRAVACHOL) 20 MG tablet Take 1 tablet (20 mg total) by mouth daily. 12/07/13  Yes Fredirick Maudlin, MD  VIVELLE-DOT 0.05 MG/24HR Place 1 patch onto the skin once a week.  04/24/10  Yes Historical Provider, MD    Inpatient Medications:  .  stroke: mapping our early stages of recovery book   Does not apply Once  . amLODipine  5 mg Oral QHS  . aspirin EC  81 mg Oral QHS  . clopidogrel  75 mg Oral Daily  . levothyroxine  75 mcg Oral QAC breakfast  . midodrine  5 mg Oral TID WC  .  simvastatin  10 mg Oral q1800   . sodium chloride 75 mL/hr at 12/09/13 0558    Allergies:  Allergies  Allergen Reactions  . Codeine Nausea And Vomiting  . Other Nausea And Vomiting    Most anesthesia   . Latex Rash    History   Social History  . Marital Status: Married    Spouse Name: N/A    Number of Children: N/A  . Years of Education: N/A   Occupational History  . Not on file.   Social History Main Topics  . Smoking status: Never Smoker   . Smokeless tobacco: Never Used  . Alcohol Use: No  . Drug Use: No  . Sexual Activity: No   Other Topics Concern  . Not on file   Social History Narrative  .  No narrative on file     Family History  Problem Relation Age of Onset  . Colon cancer Mother   . Cancer Mother     lung  . Diabetes Father   . Cancer Brother     back     Review of Systems: General: negative for chills, fever, night sweats or weight changes.  Cardiovascular: see above Dermatological: negative for rash Respiratory: negative for cough or wheezing Urologic: negative for hematuria Abdominal: negative for nausea, vomiting, diarrhea, bright red blood per rectum, melena, or hematemesis Neurologic: see above All other systems reviewed and are otherwise negative except as noted above.  Labs:  Recent Labs  12/08/13 0917  TROPONINI <0.30   Lab Results  Component Value Date   WBC 4.9 12/08/2013   HGB 14.1 12/08/2013   HCT 39.9 12/08/2013   MCV 87.9 12/08/2013   PLT 196 12/08/2013    Recent Labs Lab 12/08/13 0917  NA 141  K 4.3  CL 104  CO2 25  BUN 16  CREATININE 0.87  CALCIUM 9.6  PROT 7.4  BILITOT 0.5  ALKPHOS 93  ALT 15  AST 23  GLUCOSE 105*   Lab Results  Component Value Date   DDIMER 0.48 12/08/2013    Radiology/Studies:  Dg Cervical Spine Complete 12/06/2013   CLINICAL DATA:  Neck pain.  EXAM: CERVICAL SPINE  4+ VIEWS  COMPARISON:  April 30, 2012.  FINDINGS: Status post surgical anterior and posterior fusion of C4, C5  and C6. Good alignment of the vertebral bodies is noted. No fracture or spondylolisthesis is noted. No significant neuroforaminal stenosis is noted bilaterally.  IMPRESSION: Status post surgical fusion of C4, C5 and C6. No other abnormality seen in the cervical spine.   Electronically Signed   By: Roque Lias M.D.   On: 12/06/2013 08:53   Dg Shoulder Right 12/06/2013   CLINICAL DATA:  Neck and BILATERAL shoulder pain for years, no known injury  EXAM: RIGHT SHOULDER - 2+ VIEW  COMPARISON:  None  FINDINGS: Osseous demineralization.  AC joint alignment normal.  No acute fracture, dislocation, or bone destruction.  Visualized RIGHT ribs intact.  IMPRESSION: No acute osseous abnormalities.   Electronically Signed   By: Ulyses Southward M.D.   On: 12/06/2013 08:52   Ct Head Wo Contrast 12/08/2013   CLINICAL DATA:  Weakness, speech abnormality, diaphoretic, fatigue  EXAM: CT HEAD WITHOUT CONTRAST  TECHNIQUE: Contiguous axial images were obtained from the base of the skull through the vertex without contrast.  COMPARISON:  None  FINDINGS: Normal appearance of the intracranial structures. No evidence for acute hemorrhage, mass lesion, midline shift, hydrocephalus or large infarct. No acute bony abnormality. The visualized sinuses are clear.  IMPRESSION: No acute intracranial abnormality.   Electronically Signed   By: Ruel Favors M.D.   On: 12/08/2013 11:32   Mr Shirlee Latch Wo Contrast 12/06/2013   CLINICAL DATA:  68 year old female with weakness and syncope. TIA. Initial encounter.  EXAM: MRA HEAD WITHOUT CONTRAST  TECHNIQUE: Angiographic images of the Circle of Willis were obtained using MRA technique without intravenous contrast.  COMPARISON:  Brain MRI 12/05/2013.  FINDINGS: Antegrade flow in the posterior circulation with dominant distal left vertebral artery. Normal right PICA and dominant appearing left AICA origins. No basilar stenosis. SCA and PCA origins are normal. Posterior communicating arteries are diminutive  or absent. Bilateral PCA branches are within normal limits.  Antegrade flow in both ICA siphons. Mild siphon irregularity with no stenosis. Ophthalmic artery  origins are within normal limits. Normal carotid termini. Normal MCA and ACA origins. Normal anterior communicating artery and visualized bilateral ACA branches. Visualized bilateral MCA branches are within normal limits.  IMPRESSION: Negative intracranial MRA.   Electronically Signed   By: Augusto Gamble M.D.   On: 12/06/2013 21:00   Mr Brain Wo Contrast 12/05/2013   CLINICAL DATA:  TIA  EXAM: MRI HEAD WITHOUT CONTRAST  TECHNIQUE: Multiplanar, multiecho pulse sequences of the brain and surrounding structures were obtained without intravenous contrast.  COMPARISON:  MRI 10/01/2008  FINDINGS: Scattered small subcortical white matter hyperintensities show mild progression since 2010. Hyperintensity in the central midbrain unchanged. These findings are most consistent with chronic microvascular ischemia.  Negative for acute infarct.  Negative for hemorrhage or mass.  Ventricle size is normal.  Cerebral volume is normal.  Paranasal sinuses are clear.  IMPRESSION: Mild chronic microvascular ischemic change in the white matter and midbrain. No acute abnormality.   Electronically Signed   By: Marlan Palau M.D.   On: 12/05/2013 20:30   Mr Cervical Spine Wo Contrast 12/08/2013   CLINICAL DATA:  Transient right lower extremity weakness. Intermittent right facial and right arm numbness. Syncopal episode. Prior cervical spine surgery.  EXAM: MRI CERVICAL SPINE WITHOUT CONTRAST  TECHNIQUE: Multiplanar, multisequence MR imaging of the cervical spine was performed. No intravenous contrast was administered.  COMPARISON:  11/27/2011  FINDINGS: Straightening of the normal cervical lordosis is unchanged. There is no listhesis. Sequelae of C4-C6 ACDF are again identified. Since the prior MRI, the patient has undergone C4-C6 posterior fusion. Vertebral bone marrow signal is  slightly diminished diffusely and mildly heterogeneous diffusely. Focal areas of fatty marrow signal/ small hemangiomas are again seen in the C7 -T2 vertebral bodies. There is no evidence of cervical vertebral bone marrow edema. Vertebral body heights are preserved. Craniocervical junction is unremarkable. Cervical spinal cord is normal in caliber and signal. Paraspinal soft tissues are unremarkable.  C2-3:  Negative.  C3-4:  Negative.  C4-5: Prior anterior and posterior fusion without evidence of stenosis.  C5-6: Prior anterior and posterior fusion without evidence of stenosis.  C6-7: Small central disc protrusion with annular fissure without stenosis, similar to prior.  C7-T1:  Negative.  IMPRESSION: Prior C4-C6 anterior and posterior fusion.  No stenosis.   Electronically Signed   By: Sebastian Ache   On: 12/08/2013 18:54   US Carotid Bilateral 12/06/2013   CLINICAL DATA:  TIA  EXAM: BILATERAL CAROTID DUPLEX ULTRASOUND  TECHNIQUE: Wallace Cullens scale imaging, color Doppler and duplex ultrasound were performed of bilateral carotid and vertebral arteries in the neck.  COMPARISON:  10/02/2008  FINDINGS: Criteria: Quantification of carotid stenosis is based on velocity parameters that correlate the residual internal carotid diameter with NASCET-based stenosis levels, using the diameter of the distal internal carotid lumen as the denominator for stenosis measurement.  The following velocity measurements were obtained:  RIGHT  ICA:  107 cm/sec  CCA:  73 cm/sec  SYSTOLIC ICA/CCA RATIO:  1.5  DIASTOLIC ICA/CCA RATIO:  ECA:  86 cm/sec  LEFT  ICA:  84 cm/sec  CCA:  82 cm/sec  SYSTOLIC ICA/CCA RATIO:  1.0  DIASTOLIC ICA/CCA RATIO:  ECA:  116 cm/sec  RIGHT CAROTID ARTERY: Little if any plaque.  Normal Doppler pattern.  RIGHT VERTEBRAL ARTERY:  Antegrade.  Normal Doppler pattern.  LEFT CAROTID ARTERY: Mild irregular plaque in the bulb. Low resistance internal carotid Doppler pattern.  LEFT VERTEBRAL ARTERY:  Antegrade.  Normal Doppler  pattern.  IMPRESSION: Less  than 50% stenosis in the right and left internal carotid arteries.   Electronically Signed   By: Maryclare BeanArt  Hoss M.D.   On: 12/06/2013 09:54   Dg Chest Portable 1 View 12/08/2013   CLINICAL DATA:  Generalized weakness  EXAM: PORTABLE CHEST - 1 VIEW  COMPARISON:  12/12/2011  FINDINGS: Cardiomediastinal silhouette is unremarkable. No acute infiltrate or pleural effusion. No pulmonary edema. Bony thorax is unremarkable.  IMPRESSION: No active disease.   Electronically Signed   By: Natasha MeadLiviu  Pop M.D.   On: 12/08/2013 10:23   Dg Shoulder Left 12/06/2013   CLINICAL DATA:  Neck and BILATERAL shoulder pain for years, no known injury  EXAM: LEFT SHOULDER - 2+ VIEW  COMPARISON:  None  FINDINGS: Osseous demineralization.  AC joint alignment normal.  No acute fracture, dislocation or bone destruction.  Visualized ribs intact.  IMPRESSION: No acute osseous abnormalities.   Electronically Signed   By: Ulyses SouthwardMark  Boles M.D.   On: 12/06/2013 08:52    EKG: NSR 82bpm (not accelerated junctional - confirmed NSR on tele) LBBB, nonspecific inferolateral TW changes Tele: NSR  Physical Exam: Blood pressure 179/64, pulse 68, temperature 97.8 F (36.6 C), temperature source Oral, resp. rate 18, height 5\' 5"  (1.651 m), weight 149 lb 14.6 oz (68 kg), SpO2 98.00%. General: Well developed, well nourished, in no acute distress. Head: Normocephalic, atraumatic, sclera non-icteric, no xanthomas, nares are without discharge.  Neck: Negative for carotid bruits. JVD not elevated. Lungs: Clear bilaterally to auscultation without wheezes, rales, or rhonchi. Breathing is unlabored. Heart: RRR with S1 S2. No murmurs, rubs, or gallops appreciated. Abdomen: Soft, non-tender, non-distended with normoactive bowel sounds. No hepatomegaly. No rebound/guarding. No obvious abdominal masses. Msk:  Strength and tone appear normal for age. Extremities: No clubbing or cyanosis. No edema.  Distal pedal pulses are 2+ and equal  bilaterally. Neuro: Alert and oriented X 3. No facial asymmetry. No focal deficit. Moves all extremities spontaneously. Psych:  Responds to questions appropriately with a normal affect.   Assessment and Plan:   1. Syncopal spell with suspected seizure 2. Orthostatic blood pressure drop, only one episode of hypotension documented. 3. Recent motor abnormalities ? diagnosed with TIA 4. H/o chronic LBBB 5. Hypothyroidism - euthyroid 6. Hyperlipidemia, on statin 7. Small pericardial effusion by recent echo, not hemodynamically significant  Symptoms raise question of underlying neurologic issue contributing to possible autonomic dysfunction. Will need to see how she does on midodrine - just started today. Resting hypertension may be an issue though, and it might be better to consider a basic volume expander such as Florinef and compression hose. Can consider abdominal binder as these are more effective in some subset of patients who carry their blood volume in mesenteric/celiac reservoirs. Some folks do better on ACEI than on CCB so swapping amlodipine may be an option as well. She should probably not drive until deemed stable in the outpatient setting. Will discuss further with MD.  Signed, Ronie Spiesayna Dunn PA-C 12/09/2013, 9:18 AM   Attending note:  Patient seen and examined. Reviewed records. Agree with above assessment by Ms. Dunn PA-C. Ms. Anda KraftRivers presents with intermittent neurological symptoms and possible recent TIA, also documentation of orthostatic blood pressure drops which are potentially symptomatic. Only one episode of specific hypotension has been documented however. Recent echocardiogram shows normal LVEF with no major abnormalities, no ASD or PFO. Suspect component of autonomic dysfunction. Patient has been started on Midodrine by Dr. Juanetta GoslingHawkins. Depending on blood pressure response and also resting hypertension, may  need to consider switching this to Florinef. Would use compression hose as  well. No obvious arrhythmias noted. Continue observation for now since medication just started.  Jonelle Sidle, M.D., F.A.C.C.

## 2013-12-09 NOTE — Progress Notes (Signed)
Patient ID: [name], female   DOB: [birth year], 68 y.o.   MRN: [id number]  Donnellson A. Merlene Laughter, MD     [email or web address]          [name] is an 68 y.o. female.   Assessment/Plan: 1. The current event seems consistent with a syncopal seizure due to orthostatic hypotension. 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 She has improved significantly w  ProAmatine. This should be continue this. Dose may to increased later. Also consider thigh-high compression stockings The patient should follow-up in the off   2. Previous spells of focal neurological symptoms with negative workup but seemed to be TIA. Continue with antiplatelet agents and other risk factor reductions.   She reports feeling a lot  Better. She still remains  Orthostatic however.  LYING 136/57 72    STANDING 74/45 95  TWO DAYS AGO  LYING 174/76 75  STANDING 126/69 98 TODAY  GENERAL: This is a pleasant female who is currently in no acute distress.  HEENT: Supple. Atraumatic normocephalic.  ABDOMEN: soft  EXTREMITIES: No edema  BACK: Normal.  SKIN: Normal by inspection.  MENTAL STATUS: Alert and oriented. Speech, language and cognition are generally intact. Judgment and insight normal.  COORDINATION: Left finger to nose is normal, right finger to nose is normal, No rest tremor; no intention tremor; no postural tremor; no bradykinesia.         Objective: Vital signs in last 24 hours: Temp:  [97 F (36.1 C)-98.2 F (36.8 C)] 98.2 F (36.8 C) (08/21 1443) Pulse Rate:  [66-72] 72 (08/21 1443) Resp:  [17-20] 18 (08/21 1443) BP: (150-179)/(56-67) 164/67 mmHg (08/21 1443) SpO2:  [98 %] 98 % (08/21 1443)  Intake/Output from previous day: 08/20 0701 - 08/21 0700 In: 0  Out: 400 [Urine:400] Intake/Output this shift:   Nutritional status: Cardiac   Lab Results: Results for orders placed during the hospital encounter of 12/08/13 (from the past 48 hour(s))  CBC WITH DIFFERENTIAL      Status: None   Collection Time    12/08/13  9:17 AM      Result Value Ref Range   WBC 4.9  4.0 - 10.5 K/uL   RBC 4.54  3.87 - 5.11 MIL/uL   Hemoglobin 14.1  12.0 - 15.0 g/dL   HCT 39.9  36.0 - 46.0 %   MCV 87.9  78.0 - 100.0 fL   MCH 31.1  26.0 - 34.0 pg   MCHC 35.3  30.0 - 36.0 g/dL   RDW 12.7  11.5 - 15.5 %   Platelets 196  150 - 400 K/uL   Neutrophils Relative % 65  43 - 77 %   Neutro Abs 3.3  1.7 - 7.7 K/uL   Lymphocytes Relative 22  12 - 46 %   Lymphs Abs 1.1  0.7 - 4.0 K/uL   Monocytes Relative 10  3 - 12 %   Monocytes Absolute 0.5  0.1 - 1.0 K/uL   Eosinophils Relative 2  0 - 5 %   Eosinophils Absolute 0.1  0.0 - 0.7 K/uL   Basophils Relative 1  0 - 1 %   Basophils Absolute 0.0  0.0 - 0.1 K/uL  COMPREHENSIVE METABOLIC PANEL     Status: Abnormal   Collection Time    12/08/13  9:17 AM      Result Value Ref Range   Sodium 141  137 - 147 mEq/L   Potassium 4.3  3.7 - 5.3 mEq/L   Chloride  104  96 - 112 mEq/L   CO2 25  19 - 32 mEq/L   Glucose, Bld 105 (*) 70 - 99 mg/dL   BUN 16  6 - 23 mg/dL   Creatinine, Ser 0.87  0.50 - 1.10 mg/dL   Calcium 9.6  8.4 - 10.5 mg/dL   Total Protein 7.4  6.0 - 8.3 g/dL   Albumin 4.2  3.5 - 5.2 g/dL   AST 23  0 - 37 U/L   ALT 15  0 - 35 U/L   Alkaline Phosphatase 93  39 - 117 U/L   Total Bilirubin 0.5  0.3 - 1.2 mg/dL   GFR calc non Af Amer 68 (*) >90 mL/min   GFR calc Af Amer 79 (*) >90 mL/min   Comment: (NOTE)     The eGFR has been calculated using the CKD EPI equation.     This calculation has not been validated in all clinical situations.     eGFR's persistently <90 mL/min signify possible Chronic Kidney     Disease.   Anion gap 12  5 - 15  TROPONIN I     Status: None   Collection Time    12/08/13  9:17 AM      Result Value Ref Range   Troponin I <0.30  <0.30 ng/mL   Comment:            Due to the release kinetics of cTnI,     a negative result within the first hours     of the onset of symptoms does not rule out      myocardial infarction with certainty.     If myocardial infarction is still suspected,     repeat the test at appropriate intervals.  D-DIMER, QUANTITATIVE     Status: None   Collection Time    12/08/13 10:26 AM      Result Value Ref Range   D-Dimer, Quant 0.48  0.00 - 0.48 ug/mL-FEU   Comment:            AT THE INHOUSE ESTABLISHED CUTOFF     VALUE OF 0.48 ug/mL FEU,     THIS ASSAY HAS BEEN DOCUMENTED     IN THE LITERATURE TO HAVE     A SENSITIVITY AND NEGATIVE     PREDICTIVE VALUE OF AT LEAST     98 TO 99%.  THE TEST RESULT     SHOULD BE CORRELATED WITH     AN ASSESSMENT OF THE CLINICAL     PROBABILITY OF DVT / VTE.    Lipid Panel No results found for this basename: CHOL, TRIG, HDL, CHOLHDL, VLDL, LDLCALC,  in the last 72 hours  Studies/Results: Ct Head Wo Contrast  12/08/2013   CLINICAL DATA:  Weakness, speech abnormality, diaphoretic, fatigue  EXAM: CT HEAD WITHOUT CONTRAST  TECHNIQUE: Contiguous axial images were obtained from the base of the skull through the vertex without contrast.  COMPARISON:  None  FINDINGS: Normal appearance of the intracranial structures. No evidence for acute hemorrhage, mass lesion, midline shift, hydrocephalus or large infarct. No acute bony abnormality. The visualized sinuses are clear.  IMPRESSION: No acute intracranial abnormality.   Electronically Signed   By: Daryll Brod M.D.   On: 12/08/2013 11:32   Mr Cervical Spine Wo Contrast  12/08/2013   CLINICAL DATA:  Transient right lower extremity weakness. Intermittent right facial and right arm numbness. Syncopal episode. Prior cervical spine surgery.  EXAM: MRI CERVICAL SPINE WITHOUT CONTRAST  TECHNIQUE:  Multiplanar, multisequence MR imaging of the cervical spine was performed. No intravenous contrast was administered.  COMPARISON:  11/27/2011  FINDINGS: Straightening of the normal cervical lordosis is unchanged. There is no listhesis. Sequelae of C4-C6 ACDF are again identified. Since the prior MRI,  the patient has undergone C4-C6 posterior fusion. Vertebral bone marrow signal is slightly diminished diffusely and mildly heterogeneous diffusely. Focal areas of fatty marrow signal/ small hemangiomas are again seen in the C7 -T2 vertebral bodies. There is no evidence of cervical vertebral bone marrow edema. Vertebral body heights are preserved. Craniocervical junction is unremarkable. Cervical spinal cord is normal in caliber and signal. Paraspinal soft tissues are unremarkable.  C2-3:  Negative.  C3-4:  Negative.  C4-5: Prior anterior and posterior fusion without evidence of stenosis.  C5-6: Prior anterior and posterior fusion without evidence of stenosis.  C6-7: Small central disc protrusion with annular fissure without stenosis, similar to prior.  C7-T1:  Negative.  IMPRESSION: Prior C4-C6 anterior and posterior fusion.  No stenosis.   Electronically Signed   By: Logan Bores   On: 12/08/2013 18:54   Dg Chest Portable 1 View  12/08/2013   CLINICAL DATA:  Generalized weakness  EXAM: PORTABLE CHEST - 1 VIEW  COMPARISON:  12/12/2011  FINDINGS: Cardiomediastinal silhouette is unremarkable. No acute infiltrate or pleural effusion. No pulmonary edema. Bony thorax is unremarkable.  IMPRESSION: No active disease.   Electronically Signed   By: Lahoma Crocker M.D.   On: 12/08/2013 10:23    Medications:  Scheduled Meds: . amLODipine  5 mg Oral QHS  . aspirin EC  81 mg Oral QHS  . clopidogrel  75 mg Oral Daily  . levothyroxine  75 mcg Oral QAC breakfast  . midodrine  5 mg Oral TID WC  . simvastatin  10 mg Oral q1800   Continuous Infusions: . sodium chloride 75 mL/hr at 12/09/13 0558   PRN Meds:.acetaminophen, acetaminophen     LOS: 1 day   Gerri Acre A. Merlene Laughter, M.D.  Diplomate, Tax adviser of Psychiatry and Neurology ( Neurology).

## 2013-12-09 NOTE — Evaluation (Signed)
Occupational Therapy Evaluation Patient Details Name: [name] MRN: [id number] DOB: Jul 10, 1945 Today's Date: 12/09/2013    History of Present Illness 68 year old woman presented to the emergency department with a history of right leg weakness and syncope this morning. Initial evaluation was unremarkable, neurologic exam was nonfocal, and patient was referred for observation.   Clinical Impression   Pt is presenting to acute OT with above situation.  She presented at a modified independent level with all ADL and functional mobility needs.  Pt has no concerns for engaging in ADL tasks after d/c home, other than correcting current BP difficulties.  Recommend a shower  be used for safety - pt verbalized understanding OT services needed at this time - pt to be discharged from acute OT services.    Follow Up Recommendations  No OT follow up    Equipment Recommendations  Tub/shower seat    Recommendations for Other Services       Precautions / Restrictions Precautions Precautions: Fall Restrictions Weight Bearing Restrictions: No      Mobility Bed Mobility Overal bed mobility: Independent                Transfers Overall transfer level: Modified independent                    Balance Overall balance assessment: No apparent balance deficits (not formally assessed)                                          ADL Overall ADL's : At baseline;Modified independent                                             Vision                     Perception     Praxis      Pertinent Vitals/Pain Pain Assessment: No/denies pain     Hand Dominance Left   Extremity/Trunk Assessment Upper Extremity Assessment Upper Extremity Assessment: Overall WFL for tasks assessed   Lower Extremity Assessment Lower Extremity Assessment: Defer to PT evaluation;Overall WFL for tasks assessed       Communication  Communication Communication: No difficulties   Cognition Arousal/Alertness: Awake/alert Behavior During Therapy: WFL for tasks assessed/performed Overall Cognitive Status: Within Functional Limits for tasks assessed                     General Comments       Exercises       Shoulder Instructions      Home Living Family/patient expects to be discharged to:: Private residence Living Arrangements: Spouse/significant other (Husband has MS) Available Help at Discharge: Family;Available PRN/intermittently Type of Home: House Home Access: Stairs to enter Entergy CorporationEntrance Stairs-Number of Steps: 3 Entrance Stairs-Rails: Right;Left;Can reach both Home Layout: One level     Bathroom Shower/Tub: Chief Strategy OfficerTub/shower unit   Bathroom Toilet: Standard     Home Equipment: Grab bars - tub/shower;Cane - quad          Prior Functioning/Environment Level of Independence: Independent             OT Diagnosis:     OT Problem List:     OT Treatment/Interventions:      OT  Goals(Current goals can be found in the care plan section) Acute Rehab OT Goals Patient Stated Goal: No OT goals needed OT Goal Formulation: With patient  OT Frequency:     Barriers to D/C:            Co-evaluation              End of Session Equipment Utilized During Treatment: Gait belt  Activity Tolerance: Patient tolerated treatment well Patient left: in bed;with family/visitor present;with call bell/phone within reach;with bed alarm set   Time: 1610-9604 OT Time Calculation (min): 22 min Charges:  OT General Charges $OT Visit: 1 Procedure OT Evaluation $Initial OT Evaluation Tier I: 1 Procedure G-Codes: OT G-codes **NOT FOR INPATIENT CLASS** Functional Assessment Tool Used: clinical Judgement Functional Limitation: Self care Self Care Current Status (V4098): At least 1 percent but less than 20 percent impaired, limited or restricted Self Care Goal Status (J1914): At least 1 percent but less  than 20 percent impaired, limited or restricted Self Care Discharge Status [phone]): At least 1 percent but less than 20 percent impaired, limited or restricted    Marry Guan, MS, OTR/L [phone] 12/09/2013, 9:37 AM

## 2013-12-10 LAB — CORTISOL-AM, BLOOD: Cortisol - AM: 7.8 ug/dL (ref 4.3–22.4)

## 2013-12-10 MED ORDER — LISINOPRIL 10 MG PO TABS
10.0000 mg | ORAL_TABLET | Freq: Every day | ORAL | Status: DC
  Administered 2013-12-10 – 2013-12-13 (×3): 10 mg via ORAL
  Filled 2013-12-10 (×3): qty 1

## 2013-12-10 NOTE — Progress Notes (Signed)
Subjective: She says she feels okay. She has significant orthostatic changes still with an approximately 60-70 mm drop of blood pressure. This does seem to improve after 3 minutes. She says she's asymptomatic. She is tolerating ProAmatine okay.  Objective: Vital signs in last 24 hours: Temp:  [98.2 F (36.8 C)] 98.2 F (36.8 C) (08/22 0500) Pulse Rate:  [68-72] 68 (08/21 2212) Resp:  [16-18] 16 (08/22 0500) BP: (164)/(67) 164/67 mmHg (08/21 1443) SpO2:  [98 %-99 %] 99 % (08/22 0500) Weight change:  Last BM Date: 12/09/13  Intake/Output from previous day: 08/21 0701 - 08/22 0700 In: 1080 [P.O.:240; I.V.:840] Out: -   PHYSICAL EXAM General appearance: alert, cooperative and no distress Resp: clear to auscultation bilaterally Cardio: regular rate and rhythm, S1, S2 normal, no murmur, click, rub or gallop GI: soft, non-tender; bowel sounds normal; no masses,  no organomegaly Extremities: extremities normal, atraumatic, no cyanosis or edema  Lab Results:  Results for orders placed during the hospital encounter of 12/08/13 (from the past 48 hour(s))  CBC WITH DIFFERENTIAL     Status: None   Collection Time    12/08/13  9:17 AM      Result Value Ref Range   WBC 4.9  4.0 - 10.5 K/uL   RBC 4.54  3.87 - 5.11 MIL/uL   Hemoglobin 14.1  12.0 - 15.0 g/dL   HCT 39.9  36.0 - 46.0 %   MCV 87.9  78.0 - 100.0 fL   MCH 31.1  26.0 - 34.0 pg   MCHC 35.3  30.0 - 36.0 g/dL   RDW 12.7  11.5 - 15.5 %   Platelets 196  150 - 400 K/uL   Neutrophils Relative % 65  43 - 77 %   Neutro Abs 3.3  1.7 - 7.7 K/uL   Lymphocytes Relative 22  12 - 46 %   Lymphs Abs 1.1  0.7 - 4.0 K/uL   Monocytes Relative 10  3 - 12 %   Monocytes Absolute 0.5  0.1 - 1.0 K/uL   Eosinophils Relative 2  0 - 5 %   Eosinophils Absolute 0.1  0.0 - 0.7 K/uL   Basophils Relative 1  0 - 1 %   Basophils Absolute 0.0  0.0 - 0.1 K/uL  COMPREHENSIVE METABOLIC PANEL     Status: Abnormal   Collection Time    12/08/13  9:17 AM       Result Value Ref Range   Sodium 141  137 - 147 mEq/L   Potassium 4.3  3.7 - 5.3 mEq/L   Chloride 104  96 - 112 mEq/L   CO2 25  19 - 32 mEq/L   Glucose, Bld 105 (*) 70 - 99 mg/dL   BUN 16  6 - 23 mg/dL   Creatinine, Ser 0.87  0.50 - 1.10 mg/dL   Calcium 9.6  8.4 - 10.5 mg/dL   Total Protein 7.4  6.0 - 8.3 g/dL   Albumin 4.2  3.5 - 5.2 g/dL   AST 23  0 - 37 U/L   ALT 15  0 - 35 U/L   Alkaline Phosphatase 93  39 - 117 U/L   Total Bilirubin 0.5  0.3 - 1.2 mg/dL   GFR calc non Af Amer 68 (*) >90 mL/min   GFR calc Af Amer 79 (*) >90 mL/min   Comment: (NOTE)     The eGFR has been calculated using the CKD EPI equation.     This calculation has not been validated  in all clinical situations.     eGFR's persistently <90 mL/min signify possible Chronic Kidney     Disease.   Anion gap 12  5 - 15  TROPONIN I     Status: None   Collection Time    12/08/13  9:17 AM      Result Value Ref Range   Troponin I <0.30  <0.30 ng/mL   Comment:            Due to the release kinetics of cTnI,     a negative result within the first hours     of the onset of symptoms does not rule out     myocardial infarction with certainty.     If myocardial infarction is still suspected,     repeat the test at appropriate intervals.  D-DIMER, QUANTITATIVE     Status: None   Collection Time    12/08/13 10:26 AM      Result Value Ref Range   D-Dimer, Quant 0.48  0.00 - 0.48 ug/mL-FEU   Comment:            AT THE INHOUSE ESTABLISHED CUTOFF     VALUE OF 0.48 ug/mL FEU,     THIS ASSAY HAS BEEN DOCUMENTED     IN THE LITERATURE TO HAVE     A SENSITIVITY AND NEGATIVE     PREDICTIVE VALUE OF AT LEAST     98 TO 99%.  THE TEST RESULT     SHOULD BE CORRELATED WITH     AN ASSESSMENT OF THE CLINICAL     PROBABILITY OF DVT / VTE.    ABGS No results found for this basename: PHART, PCO2, PO2ART, TCO2, HCO3,  in the last 72 hours CULTURES No results found for this or any previous visit (from the past 240  hour(s)). Studies/Results: Ct Head Wo Contrast  12/08/2013   CLINICAL DATA:  Weakness, speech abnormality, diaphoretic, fatigue  EXAM: CT HEAD WITHOUT CONTRAST  TECHNIQUE: Contiguous axial images were obtained from the base of the skull through the vertex without contrast.  COMPARISON:  None  FINDINGS: Normal appearance of the intracranial structures. No evidence for acute hemorrhage, mass lesion, midline shift, hydrocephalus or large infarct. No acute bony abnormality. The visualized sinuses are clear.  IMPRESSION: No acute intracranial abnormality.   Electronically Signed   By: Daryll Brod M.D.   On: 12/08/2013 11:32   Mr Cervical Spine Wo Contrast  12/08/2013   CLINICAL DATA:  Transient right lower extremity weakness. Intermittent right facial and right arm numbness. Syncopal episode. Prior cervical spine surgery.  EXAM: MRI CERVICAL SPINE WITHOUT CONTRAST  TECHNIQUE: Multiplanar, multisequence MR imaging of the cervical spine was performed. No intravenous contrast was administered.  COMPARISON:  11/27/2011  FINDINGS: Straightening of the normal cervical lordosis is unchanged. There is no listhesis. Sequelae of C4-C6 ACDF are again identified. Since the prior MRI, the patient has undergone C4-C6 posterior fusion. Vertebral bone marrow signal is slightly diminished diffusely and mildly heterogeneous diffusely. Focal areas of fatty marrow signal/ small hemangiomas are again seen in the C7 -T2 vertebral bodies. There is no evidence of cervical vertebral bone marrow edema. Vertebral body heights are preserved. Craniocervical junction is unremarkable. Cervical spinal cord is normal in caliber and signal. Paraspinal soft tissues are unremarkable.  C2-3:  Negative.  C3-4:  Negative.  C4-5: Prior anterior and posterior fusion without evidence of stenosis.  C5-6: Prior anterior and posterior fusion without evidence of stenosis.  C6-7: Small central  disc protrusion with annular fissure without stenosis, similar to  prior.  C7-T1:  Negative.  IMPRESSION: Prior C4-C6 anterior and posterior fusion.  No stenosis.   Electronically Signed   By: Logan Bores   On: 12/08/2013 18:54   Dg Chest Portable 1 View  12/08/2013   CLINICAL DATA:  Generalized weakness  EXAM: PORTABLE CHEST - 1 VIEW  COMPARISON:  12/12/2011  FINDINGS: Cardiomediastinal silhouette is unremarkable. No acute infiltrate or pleural effusion. No pulmonary edema. Bony thorax is unremarkable.  IMPRESSION: No active disease.   Electronically Signed   By: Lahoma Crocker M.D.   On: 12/08/2013 10:23    Medications:  Prior to Admission:  Prescriptions prior to admission  Medication Sig Dispense Refill  . amLODipine (NORVASC) 5 MG tablet Take 5 mg by mouth at bedtime.       Marland Kitchen aspirin 81 MG tablet Take 81 mg by mouth at bedtime.       . calcium-vitamin D (OSCAL WITH D) 500-200 MG-UNIT per tablet Take 1 tablet by mouth at bedtime.       . clopidogrel (PLAVIX) 75 MG tablet Take 1 tablet (75 mg total) by mouth daily.  30 tablet  12  . HYDROcodone-acetaminophen (NORCO/VICODIN) 5-325 MG per tablet Take 1 tablet by mouth every 4 (four) hours as needed for pain.  10 tablet  0  . levothyroxine (SYNTHROID, LEVOTHROID) 75 MCG tablet Take 75 mcg by mouth daily.        . multivitamin-iron-minerals-folic acid (CENTRUM) chewable tablet Chew 1 tablet by mouth at bedtime.       . naproxen sodium (ANAPROX) 220 MG tablet Take 440 mg by mouth 2 (two) times daily as needed. For pain      . pravastatin (PRAVACHOL) 20 MG tablet Take 1 tablet (20 mg total) by mouth daily.  30 tablet  12  . VIVELLE-DOT 0.05 MG/24HR Place 1 patch onto the skin once a week.        Scheduled: . amLODipine  5 mg Oral QHS  . aspirin EC  81 mg Oral QHS  . clopidogrel  75 mg Oral Daily  . levothyroxine  75 mcg Oral QAC breakfast  . midodrine  5 mg Oral TID WC  . simvastatin  10 mg Oral q1800   Continuous: . sodium chloride 75 mL/hr at 12/09/13 0558   DIX:[id number], acetaminophen  Assesment:  She has been admitted with syncope. She had significant orthostatic hypotension. She had a TIA earlier. She says she wants to go home but I would like to see her have less orthostasis before she goes. Principal Problem:   Syncope Active Problems:   HYPERLIPIDEMIA-MIXED   TIA (transient ischemic attack)   Orthostatic hypotension   LBBB (left bundle branch block)   Hypothyroidism    Plan: Per cardiology note switch from amlodipine to lisinopril. I had ordered morning and evening cortisol levels but they're listed as discontinued so I will reorder    LOS: 2 days   Jahari Wiginton L 12/10/2013, 8:36 AM

## 2013-12-11 MED ORDER — FLUDROCORTISONE ACETATE 0.1 MG PO TABS
0.1000 mg | ORAL_TABLET | Freq: Every day | ORAL | Status: DC
  Administered 2013-12-11: 0.1 mg via ORAL
  Filled 2013-12-11 (×2): qty 1

## 2013-12-11 NOTE — Progress Notes (Signed)
Subjective: She says she wants to go home. However this morning she had significant orthostasis that was symptomatic. As I told her I don't think it's safe to send her home under the circumstances.  Objective: Vital signs in last 24 hours: Temp:  [97.5 F (36.4 C)-97.9 F (36.6 C)] 97.9 F (36.6 C) (08/23 0549) Pulse Rate:  [84] 84 (08/22 1429) Resp:  [15] 15 (08/22 1429) BP: (71-160)/(47-68) 71/47 mmHg (08/23 0822) SpO2:  [99 %-100 %] 99 % (08/23 0549) Weight change:  Last BM Date: 12/11/13  Intake/Output from previous day:    PHYSICAL EXAM General appearance: alert, cooperative and mild distress Resp: clear to auscultation bilaterally Cardio: regular rate and rhythm, S1, S2 normal, no murmur, click, rub or gallop GI: soft, non-tender; bowel sounds normal; no masses,  no organomegaly Extremities: extremities normal, atraumatic, no cyanosis or edema  Lab Results:  Results for orders placed during the hospital encounter of 12/08/13 (from the past 48 hour(s))  CORTISOL-AM, BLOOD     Status: None   Collection Time    12/10/13  8:46 AM      Result Value Ref Range   Cortisol - AM 7.8  4.3 - 22.4 ug/dL   Comment: Performed at [facility]    ABGS No results found for this basename: PHART, PCO2, PO2ART, TCO2, HCO3,  in the last 72 hours CULTURES No results found for this or any previous visit (from the past 240 hour(s)). Studies/Results: No results found.  Medications:  Prior to Admission:  Prescriptions prior to admission  Medication Sig Dispense Refill  . amLODipine (NORVASC) 5 MG tablet Take 5 mg by mouth at bedtime.       Marland Kitchen aspirin 81 MG tablet Take 81 mg by mouth at bedtime.       . calcium-vitamin D (OSCAL WITH D) 500-200 MG-UNIT per tablet Take 1 tablet by mouth at bedtime.       . clopidogrel (PLAVIX) 75 MG tablet Take 1 tablet (75 mg total) by mouth daily.  30 tablet  12  . HYDROcodone-acetaminophen (NORCO/VICODIN) 5-325 MG per tablet Take 1 tablet by  mouth every 4 (four) hours as needed for pain.  10 tablet  0  . levothyroxine (SYNTHROID, LEVOTHROID) 75 MCG tablet Take 75 mcg by mouth daily.        . multivitamin-iron-minerals-folic acid (CENTRUM) chewable tablet Chew 1 tablet by mouth at bedtime.       . naproxen sodium (ANAPROX) 220 MG tablet Take 440 mg by mouth 2 (two) times daily as needed. For pain      . pravastatin (PRAVACHOL) 20 MG tablet Take 1 tablet (20 mg total) by mouth daily.  30 tablet  12  . VIVELLE-DOT 0.05 MG/24HR Place 1 patch onto the skin once a week.        Scheduled: . aspirin EC  81 mg Oral QHS  . clopidogrel  75 mg Oral Daily  . fludrocortisone  0.1 mg Oral Daily  . levothyroxine  75 mcg Oral QAC breakfast  . lisinopril  10 mg Oral Daily  . simvastatin  10 mg Oral q1800   Continuous:  ZOX:[id number], acetaminophen  Assesment: She had symptomatic orthostatic hypotension this morning. Since she has syncope presumably related to orthostasis she's not safe to go home. She was treated for TIA earlier and is still on treatment for that. I will plan to discontinue ProAmatine and switched to Florinef and see if it does any better. Principal Problem:   Syncope Active Problems:  HYPERLIPIDEMIA-MIXED   TIA (transient ischemic attack)   Orthostatic hypotension   LBBB (left bundle branch block)   Hypothyroidism    Plan: Continue treatments. Change to Florinef as above.    LOS: 3 days   Kaleea Penner L 12/11/2013, 9:14 AM

## 2013-12-12 LAB — CORTISOL-PM, BLOOD: CORTISOL - PM: 4.2 ug/dL (ref 3.1–16.7)

## 2013-12-12 MED ORDER — LISINOPRIL 10 MG PO TABS: 10.0000 mg | ORAL_TABLET | Freq: Every day | ORAL | Status: DC

## 2013-12-12 MED ORDER — FLUDROCORTISONE ACETATE 0.1 MG PO TABS
0.2000 mg | ORAL_TABLET | Freq: Every day | ORAL | Status: DC
  Administered 2013-12-12 – 2013-12-13 (×2): 0.2 mg via ORAL
  Filled 2013-12-12 (×3): qty 2

## 2013-12-12 MED ORDER — FLUDROCORTISONE ACETATE 0.1 MG PO TABS: 0.2000 mg | ORAL_TABLET | Freq: Every day | ORAL | Status: DC

## 2013-12-12 MED ORDER — ALPRAZOLAM 0.25 MG PO TABS
0.2500 mg | ORAL_TABLET | Freq: Three times a day (TID) | ORAL | Status: DC | PRN
  Administered 2013-12-12: 0.25 mg via ORAL
  Filled 2013-12-12: qty 1

## 2013-12-12 NOTE — Progress Notes (Signed)
Dr. Juanetta Gosling notified of patients orthostatic BP results. Notified that patient is still complaining of being dizzy when standing. Will continue to monitor as per Dr. Juanetta Gosling order. Medications given as ordered.

## 2013-12-12 NOTE — Progress Notes (Signed)
Patient complains of feeling anxious. Xanax given as ordered by Dr. Juanetta Gosling.

## 2013-12-12 NOTE — Discharge Summary (Signed)
Physician Discharge Summary  Patient ID: [name] MRN: [id number] DOB/AGE: Aug 05, 1945 68 y.o. Primary Care Physician:Lexington Devine L, MD Admit date: 12/08/2013 Discharge date: 12/12/2013    Discharge Diagnoses:   Principal Problem:   Syncope Active Problems:   HYPERLIPIDEMIA-MIXED   TIA (transient ischemic attack)   Orthostatic hypotension   LBBB (left bundle branch block)   Hypothyroidism     Medication List    STOP taking these medications       amLODipine 5 MG tablet  Commonly known as:  NORVASC      TAKE these medications       aspirin 81 MG tablet  Take 81 mg by mouth at bedtime.     calcium-vitamin D 500-200 MG-UNIT per tablet  Commonly known as:  OSCAL WITH D  Take 1 tablet by mouth at bedtime.     clopidogrel 75 MG tablet  Commonly known as:  PLAVIX  Take 1 tablet (75 mg total) by mouth daily.     fludrocortisone 0.1 MG tablet  Commonly known as:  FLORINEF  Take 2 tablets (0.2 mg total) by mouth daily.     HYDROcodone-acetaminophen 5-325 MG per tablet  Commonly known as:  NORCO/VICODIN  Take 1 tablet by mouth every 4 (four) hours as needed for pain.     levothyroxine 75 MCG tablet  Commonly known as:  SYNTHROID, LEVOTHROID  Take 75 mcg by mouth daily.     lisinopril 10 MG tablet  Commonly known as:  PRINIVIL,ZESTRIL  Take 1 tablet (10 mg total) by mouth daily.     multivitamin-iron-minerals-folic acid chewable tablet  Chew 1 tablet by mouth at bedtime.     naproxen sodium 220 MG tablet  Commonly known as:  ANAPROX  Take 440 mg by mouth 2 (two) times daily as needed. For pain     pravastatin 20 MG tablet  Commonly known as:  PRAVACHOL  Take 1 tablet (20 mg total) by mouth daily.     VIVELLE-DOT 0.05 MG/24HR patch  Generic drug:  estradiol  Place 1 patch onto the skin once a week.        Discharged Condition:improved    Consults:cardiology,neurology  Significant Diagnostic Studies: Dg Cervical Spine Complete  12/06/2013    CLINICAL DATA:  Neck pain.  EXAM: CERVICAL SPINE  4+ VIEWS  COMPARISON:  April 30, 2012.  FINDINGS: Status post surgical anterior and posterior fusion of C4, C5 and C6. Good alignment of the vertebral bodies is noted. No fracture or spondylolisthesis is noted. No significant neuroforaminal stenosis is noted bilaterally.  IMPRESSION: Status post surgical fusion of C4, C5 and C6. No other abnormality seen in the cervical spine.   Electronically Signed   By: Roque Lias M.D.   On: 12/06/2013 08:53   Dg Shoulder Right  12/06/2013   CLINICAL DATA:  Neck and BILATERAL shoulder pain for years, no known injury  EXAM: RIGHT SHOULDER - 2+ VIEW  COMPARISON:  None  FINDINGS: Osseous demineralization.  AC joint alignment normal.  No acute fracture, dislocation, or bone destruction.  Visualized RIGHT ribs intact.  IMPRESSION: No acute osseous abnormalities.   Electronically Signed   By: Ulyses Southward M.D.   On: 12/06/2013 08:52   Ct Head Wo Contrast  12/08/2013   CLINICAL DATA:  Weakness, speech abnormality, diaphoretic, fatigue  EXAM: CT HEAD WITHOUT CONTRAST  TECHNIQUE: Contiguous axial images were obtained from the base of the skull through the vertex without contrast.  COMPARISON:  None  FINDINGS: Normal appearance  of the intracranial structures. No evidence for acute hemorrhage, mass lesion, midline shift, hydrocephalus or large infarct. No acute bony abnormality. The visualized sinuses are clear.  IMPRESSION: No acute intracranial abnormality.   Electronically Signed   By: Ruel Favors M.D.   On: 12/08/2013 11:32   Mr Shirlee Latch Wo Contrast  12/06/2013   CLINICAL DATA:  68 year old female with weakness and syncope. TIA. Initial encounter.  EXAM: MRA HEAD WITHOUT CONTRAST  TECHNIQUE: Angiographic images of the Circle of Willis were obtained using MRA technique without intravenous contrast.  COMPARISON:  Brain MRI 12/05/2013.  FINDINGS: Antegrade flow in the posterior circulation with dominant distal left vertebral  artery. Normal right PICA and dominant appearing left AICA origins. No basilar stenosis. SCA and PCA origins are normal. Posterior communicating arteries are diminutive or absent. Bilateral PCA branches are within normal limits.  Antegrade flow in both ICA siphons. Mild siphon irregularity with no stenosis. Ophthalmic artery origins are within normal limits. Normal carotid termini. Normal MCA and ACA origins. Normal anterior communicating artery and visualized bilateral ACA branches. Visualized bilateral MCA branches are within normal limits.  IMPRESSION: Negative intracranial MRA.   Electronically Signed   By: Augusto Gamble M.D.   On: 12/06/2013 21:00   Mr Brain Wo Contrast  12/05/2013   CLINICAL DATA:  TIA  EXAM: MRI HEAD WITHOUT CONTRAST  TECHNIQUE: Multiplanar, multiecho pulse sequences of the brain and surrounding structures were obtained without intravenous contrast.  COMPARISON:  MRI 10/01/2008  FINDINGS: Scattered small subcortical white matter hyperintensities show mild progression since 2010. Hyperintensity in the central midbrain unchanged. These findings are most consistent with chronic microvascular ischemia.  Negative for acute infarct.  Negative for hemorrhage or mass.  Ventricle size is normal.  Cerebral volume is normal.  Paranasal sinuses are clear.  IMPRESSION: Mild chronic microvascular ischemic change in the white matter and midbrain. No acute abnormality.   Electronically Signed   By: Marlan Palau M.D.   On: 12/05/2013 20:30   Mr Cervical Spine Wo Contrast  12/08/2013   CLINICAL DATA:  Transient right lower extremity weakness. Intermittent right facial and right arm numbness. Syncopal episode. Prior cervical spine surgery.  EXAM: MRI CERVICAL SPINE WITHOUT CONTRAST  TECHNIQUE: Multiplanar, multisequence MR imaging of the cervical spine was performed. No intravenous contrast was administered.  COMPARISON:  11/27/2011  FINDINGS: Straightening of the normal cervical lordosis is unchanged.  There is no listhesis. Sequelae of C4-C6 ACDF are again identified. Since the prior MRI, the patient has undergone C4-C6 posterior fusion. Vertebral bone marrow signal is slightly diminished diffusely and mildly heterogeneous diffusely. Focal areas of fatty marrow signal/ small hemangiomas are again seen in the C7 -T2 vertebral bodies. There is no evidence of cervical vertebral bone marrow edema. Vertebral body heights are preserved. Craniocervical junction is unremarkable. Cervical spinal cord is normal in caliber and signal. Paraspinal soft tissues are unremarkable.  C2-3:  Negative.  C3-4:  Negative.  C4-5: Prior anterior and posterior fusion without evidence of stenosis.  C5-6: Prior anterior and posterior fusion without evidence of stenosis.  C6-7: Small central disc protrusion with annular fissure without stenosis, similar to prior.  C7-T1:  Negative.  IMPRESSION: Prior C4-C6 anterior and posterior fusion.  No stenosis.   Electronically Signed   By: Sebastian Ache   On: 12/08/2013 18:54   US Carotid Bilateral  12/06/2013   CLINICAL DATA:  TIA  EXAM: BILATERAL CAROTID DUPLEX ULTRASOUND  TECHNIQUE: Wallace Cullens scale imaging, color Doppler and duplex ultrasound were  performed of bilateral carotid and vertebral arteries in the neck.  COMPARISON:  10/02/2008  FINDINGS: Criteria: Quantification of carotid stenosis is based on velocity parameters that correlate the residual internal carotid diameter with NASCET-based stenosis levels, using the diameter of the distal internal carotid lumen as the denominator for stenosis measurement.  The following velocity measurements were obtained:  RIGHT  ICA:  107 cm/sec  CCA:  73 cm/sec  SYSTOLIC ICA/CCA RATIO:  1.5  DIASTOLIC ICA/CCA RATIO:  ECA:  86 cm/sec  LEFT  ICA:  84 cm/sec  CCA:  82 cm/sec  SYSTOLIC ICA/CCA RATIO:  1.0  DIASTOLIC ICA/CCA RATIO:  ECA:  116 cm/sec  RIGHT CAROTID ARTERY: Little if any plaque.  Normal Doppler pattern.  RIGHT VERTEBRAL ARTERY:  Antegrade.  Normal  Doppler pattern.  LEFT CAROTID ARTERY: Mild irregular plaque in the bulb. Low resistance internal carotid Doppler pattern.  LEFT VERTEBRAL ARTERY:  Antegrade.  Normal Doppler pattern.  IMPRESSION: Less than 50% stenosis in the right and left internal carotid arteries.   Electronically Signed   By: Maryclare Bean M.D.   On: 12/06/2013 09:54   Dg Chest Portable 1 View  12/08/2013   CLINICAL DATA:  Generalized weakness  EXAM: PORTABLE CHEST - 1 VIEW  COMPARISON:  12/12/2011  FINDINGS: Cardiomediastinal silhouette is unremarkable. No acute infiltrate or pleural effusion. No pulmonary edema. Bony thorax is unremarkable.  IMPRESSION: No active disease.   Electronically Signed   By: Natasha Mead M.D.   On: 12/08/2013 10:23   Dg Shoulder Left  12/06/2013   CLINICAL DATA:  Neck and BILATERAL shoulder pain for years, no known injury  EXAM: LEFT SHOULDER - 2+ VIEW  COMPARISON:  None  FINDINGS: Osseous demineralization.  AC joint alignment normal.  No acute fracture, dislocation or bone destruction.  Visualized ribs intact.  IMPRESSION: No acute osseous abnormalities.   Electronically Signed   By: Ulyses Southward M.D.   On: 12/06/2013 08:52    Lab Results: Basic Metabolic Panel: No results found for this basename: NA, K, CL, CO2, GLUCOSE, BUN, CREATININE, CALCIUM, MG, PHOS,  in the last 72 hours Liver Function Tests: No results found for this basename: AST, ALT, ALKPHOS, BILITOT, PROT, ALBUMIN,  in the last 72 hours   CBC: No results found for this basename: WBC, NEUTROABS, HGB, HCT, MCV, PLT,  in the last 72 hours  No results found for this or any previous visit (from the past 240 hour(s)).   Hospital Course: She was admitted with syncope. This appeared to be related to orthostasis. She had been in the hospital with a TIA and was noted to have orthostatic hypotension which is been a chronic problem. She was treated with TED hose and it seemed to be better. However when she got home she had an episode and passed out.  She was brought to the emergency room by EMS and admitted from there. She had neurology consultation to make sure there wasn't a seizure and it did not appear to be so. Cardiology consultation was obtained as well. She continue with compression stockings initially started on ProAmatine but it did not seem to be effective in controlling her orthostasis so she was switched to Florinef. This did better.  Discharge Exam: Blood pressure 159/57, pulse 71, temperature 97.7 F (36.5 C), temperature source Oral, resp. rate 15, height  (1.651 m), weight 68 kg (149 lb 14.6 oz), SpO2 98.00%. She is awake and alert. Her chest is clear. Her heart is regular. She  still has orthostasis but it is not as marked  Disposition: Home      Signed: Quadasia Newsham L   12/12/2013, 8:38 AM

## 2013-12-12 NOTE — Progress Notes (Signed)
Subjective: She says she feels better. She has some orthostasis this morning it was minimally symptomatic. She has no other new complaints. She wants to go home.  Objective: Vital signs in last 24 hours: Temp:  [97.6 F (36.4 C)-98.4 F (36.9 C)] 97.7 F (36.5 C) (08/24 0607) Pulse Rate:  [65-71] 71 (08/24 0607) Resp:  [15-16] 15 (08/23 2325) BP: (71-159)/(47-57) 159/57 mmHg (08/23 1500) SpO2:  [98 %-99 %] 98 % (08/24 0607) Weight change:  Last BM Date: 12/11/13  Intake/Output from previous day: 08/23 0701 - 08/24 0700 In: 480 [P.O.:480] Out: -   PHYSICAL EXAM General appearance: alert, cooperative and no distress Resp: clear to auscultation bilaterally Cardio: regular rate and rhythm, S1, S2 normal, no murmur, click, rub or gallop GI: soft, non-tender; bowel sounds normal; no masses,  no organomegaly Extremities: extremities normal, atraumatic, no cyanosis or edema  Lab Results:  Results for orders placed during the hospital encounter of 12/08/13 (from the past 48 hour(s))  CORTISOL-AM, BLOOD     Status: None   Collection Time    12/10/13  8:46 AM      Result Value Ref Range   Cortisol - AM 7.8  4.3 - 22.4 ug/dL   Comment: Performed at [facility]  CORTISOL-PM, BLOOD     Status: None   Collection Time    12/10/13  9:28 PM      Result Value Ref Range   Cortisol - PM 4.2  3.1 - 16.7 ug/dL   Comment: Performed at [facility]    ABGS No results found for this basename: PHART, PCO2, PO2ART, TCO2, HCO3,  in the last 72 hours CULTURES No results found for this or any previous visit (from the past 240 hour(s)). Studies/Results: No results found.  Medications:  Prior to Admission:  Prescriptions prior to admission  Medication Sig Dispense Refill  . amLODipine (NORVASC) 5 MG tablet Take 5 mg by mouth at bedtime.       Marland Kitchen aspirin 81 MG tablet Take 81 mg by mouth at bedtime.       . calcium-vitamin D (OSCAL WITH D) 500-200 MG-UNIT per tablet Take 1  tablet by mouth at bedtime.       . clopidogrel (PLAVIX) 75 MG tablet Take 1 tablet (75 mg total) by mouth daily.  30 tablet  12  . HYDROcodone-acetaminophen (NORCO/VICODIN) 5-325 MG per tablet Take 1 tablet by mouth every 4 (four) hours as needed for pain.  10 tablet  0  . levothyroxine (SYNTHROID, LEVOTHROID) 75 MCG tablet Take 75 mcg by mouth daily.        . multivitamin-iron-minerals-folic acid (CENTRUM) chewable tablet Chew 1 tablet by mouth at bedtime.       . naproxen sodium (ANAPROX) 220 MG tablet Take 440 mg by mouth 2 (two) times daily as needed. For pain      . pravastatin (PRAVACHOL) 20 MG tablet Take 1 tablet (20 mg total) by mouth daily.  30 tablet  12  . VIVELLE-DOT 0.05 MG/24HR Place 1 patch onto the skin once a week.        Scheduled: . aspirin EC  81 mg Oral QHS  . clopidogrel  75 mg Oral Daily  . fludrocortisone  0.2 mg Oral Daily  . levothyroxine  75 mcg Oral QAC breakfast  . lisinopril  10 mg Oral Daily  . simvastatin  10 mg Oral q1800   Continuous:  [id number], acetaminophen  Assesment: She had syncope related to orthostatic hypotension. She  is better. She was switched to Florinef yesterday and it seems to be working better than [employer]. She is approaching being ready for discharge. Principal Problem:   Syncope Active Problems:   HYPERLIPIDEMIA-MIXED   TIA (transient ischemic attack)   Orthostatic hypotension   LBBB (left bundle branch block)   Hypothyroidism    Plan: I will try to get her up and moving around a little more. Increase Florinef to 0.2 mg. Continue with her other treatments. After we checked her after she is ambulating she may be able to be discharged later today    LOS: 4 days   Natalie Mceuen L 12/12/2013, 8:15 AM

## 2013-12-12 NOTE — Progress Notes (Signed)
Patient ambulated in hallway. Slow steady gait noted. Patient feeling less anxious after xanax. No complaints voiced at this time.

## 2013-12-13 MED ORDER — ALPRAZOLAM 0.25 MG PO TABS: 0.2500 mg | ORAL_TABLET | Freq: Three times a day (TID) | ORAL | Status: DC | PRN

## 2013-12-13 NOTE — Progress Notes (Signed)
IV removed. Discharge instructions reviewed with patient. Understanding verbalized. Ready for discharge home 

## 2013-12-13 NOTE — Progress Notes (Signed)
UR review complete.  

## 2013-12-13 NOTE — Progress Notes (Signed)
She feels much better today. No symptoms of orthostasis but she still does have significant orthostatic drop in her blood pressure. Her husband says he will be home all week. She had some anxiety yesterday and to Xanax and that has improved. She wants to go home. Under the circumstances with no further orthostatic symptoms and her husband being home I think it's okay for her to go. She will be seen in my office on 12/16/2013 for blood pressure check

## 2013-12-30 ENCOUNTER — Telehealth (HOSPITAL_COMMUNITY): Payer: Self-pay

## 2014-02-05 ENCOUNTER — Observation Stay (HOSPITAL_COMMUNITY)
Admission: EM | Admit: 2014-02-05 | Discharge: 2014-02-07 | Disposition: A | Discharge status: Home / Self Care | Payer: BC Managed Care – PPO | Attending: Pulmonary Disease | Admitting: Pulmonary Disease

## 2014-02-05 ENCOUNTER — Emergency Department (HOSPITAL_COMMUNITY): Payer: BC Managed Care – PPO

## 2014-02-05 ENCOUNTER — Encounter (HOSPITAL_COMMUNITY): Payer: Self-pay | Admitting: Emergency Medicine

## 2014-02-05 DIAGNOSIS — Z7952 Long term (current) use of systemic steroids: Secondary | ICD-10-CM | POA: Insufficient documentation

## 2014-02-05 DIAGNOSIS — J069 Acute upper respiratory infection, unspecified: Secondary | ICD-10-CM | POA: Diagnosis not present

## 2014-02-05 DIAGNOSIS — Z7982 Long term (current) use of aspirin: Secondary | ICD-10-CM | POA: Insufficient documentation

## 2014-02-05 DIAGNOSIS — Z91040 Latex allergy status: Secondary | ICD-10-CM | POA: Diagnosis not present

## 2014-02-05 DIAGNOSIS — I5033 Acute on chronic diastolic (congestive) heart failure: Secondary | ICD-10-CM

## 2014-02-05 DIAGNOSIS — I951 Orthostatic hypotension: Secondary | ICD-10-CM | POA: Insufficient documentation

## 2014-02-05 DIAGNOSIS — R0600 Dyspnea, unspecified: Secondary | ICD-10-CM | POA: Diagnosis not present

## 2014-02-05 DIAGNOSIS — Z79899 Other long term (current) drug therapy: Secondary | ICD-10-CM | POA: Diagnosis not present

## 2014-02-05 DIAGNOSIS — Z7902 Long term (current) use of antithrombotics/antiplatelets: Secondary | ICD-10-CM | POA: Insufficient documentation

## 2014-02-05 DIAGNOSIS — J810 Acute pulmonary edema: Secondary | ICD-10-CM | POA: Diagnosis not present

## 2014-02-05 DIAGNOSIS — I10 Essential (primary) hypertension: Secondary | ICD-10-CM | POA: Diagnosis not present

## 2014-02-05 DIAGNOSIS — I160 Hypertensive urgency: Secondary | ICD-10-CM

## 2014-02-05 DIAGNOSIS — I447 Left bundle-branch block, unspecified: Secondary | ICD-10-CM | POA: Insufficient documentation

## 2014-02-05 DIAGNOSIS — K219 Gastro-esophageal reflux disease without esophagitis: Secondary | ICD-10-CM | POA: Insufficient documentation

## 2014-02-05 DIAGNOSIS — R05 Cough: Secondary | ICD-10-CM | POA: Diagnosis present

## 2014-02-05 DIAGNOSIS — E039 Hypothyroidism, unspecified: Secondary | ICD-10-CM | POA: Insufficient documentation

## 2014-02-05 DIAGNOSIS — E785 Hyperlipidemia, unspecified: Secondary | ICD-10-CM | POA: Insufficient documentation

## 2014-02-05 DIAGNOSIS — M1990 Unspecified osteoarthritis, unspecified site: Secondary | ICD-10-CM | POA: Insufficient documentation

## 2014-02-05 DIAGNOSIS — Z9889 Other specified postprocedural states: Secondary | ICD-10-CM | POA: Diagnosis not present

## 2014-02-05 HISTORY — DX: Transient cerebral ischemic attack, unspecified: G45.9

## 2014-02-05 HISTORY — DX: Essential (primary) hypertension: I10

## 2014-02-05 LAB — CBC WITH DIFFERENTIAL/PLATELET
Basophils Absolute: 0.1 10*3/uL (ref 0.0–0.1)
Basophils Relative: 1 % (ref 0–1)
EOS PCT: 4 % (ref 0–5)
Eosinophils Absolute: 0.2 10*3/uL (ref 0.0–0.7)
HCT: 38 % (ref 36.0–46.0)
Hemoglobin: 13.1 g/dL (ref 12.0–15.0)
LYMPHS ABS: 1.9 10*3/uL (ref 0.7–4.0)
LYMPHS PCT: 33 % (ref 12–46)
MCH: 31 pg (ref 26.0–34.0)
MCHC: 34.5 g/dL (ref 30.0–36.0)
MCV: 90 fL (ref 78.0–100.0)
Monocytes Absolute: 0.3 10*3/uL (ref 0.1–1.0)
Monocytes Relative: 6 % (ref 3–12)
NEUTROS PCT: 56 % (ref 43–77)
Neutro Abs: 3.3 10*3/uL (ref 1.7–7.7)
Platelets: 222 10*3/uL (ref 150–400)
RBC: 4.22 MIL/uL (ref 3.87–5.11)
RDW: 13.3 % (ref 11.5–15.5)
WBC: 5.9 10*3/uL (ref 4.0–10.5)

## 2014-02-05 LAB — BASIC METABOLIC PANEL
Anion gap: 10 (ref 5–15)
BUN: 18 mg/dL (ref 6–23)
CO2: 29 mEq/L (ref 19–32)
Calcium: 9.5 mg/dL (ref 8.4–10.5)
Chloride: 107 mEq/L (ref 96–112)
Creatinine, Ser: 0.78 mg/dL (ref 0.50–1.10)
GFR calc Af Amer: >90 mL/min (ref >90)
GFR calc non Af Amer: 85 mL/min — ABNORMAL LOW (ref >90)
GLUCOSE: 100 mg/dL — AB (ref 70–99)
Potassium: 3.7 mEq/L (ref 3.7–5.3)
Sodium: 146 mEq/L (ref 137–147)

## 2014-02-05 LAB — PRO B NATRIURETIC PEPTIDE: PRO B NATRI PEPTIDE: 2140 pg/mL — AB (ref 0–125)

## 2014-02-05 LAB — TROPONIN I: Troponin I: <0.3 ng/mL (ref <0.30)

## 2014-02-05 MED ORDER — FUROSEMIDE 10 MG/ML IJ SOLN
20.0000 mg | Freq: Once | INTRAMUSCULAR | Status: AC
  Administered 2014-02-06: 20 mg via INTRAVENOUS
  Filled 2014-02-05: qty 2

## 2014-02-05 MED ORDER — NITROGLYCERIN 0.4 MG SL SUBL
0.4000 mg | SUBLINGUAL_TABLET | SUBLINGUAL | Status: DC | PRN
  Administered 2014-02-05: 0.4 mg via SUBLINGUAL
  Filled 2014-02-05: qty 1

## 2014-02-05 MED ORDER — ASPIRIN 81 MG PO CHEW
324.0000 mg | CHEWABLE_TABLET | Freq: Once | ORAL | Status: AC
  Administered 2014-02-05: 324 mg via ORAL
  Filled 2014-02-05: qty 4

## 2014-02-05 MED ORDER — NITROGLYCERIN 2 % TD OINT: 1.0000 [in_us] | TOPICAL_OINTMENT | Freq: Once | TRANSDERMAL | Status: DC

## 2014-02-05 MED ORDER — ALBUTEROL SULFATE (2.5 MG/3ML) 0.083% IN NEBU
5.0000 mg | INHALATION_SOLUTION | Freq: Once | RESPIRATORY_TRACT | Status: AC
  Administered 2014-02-05: 5 mg via RESPIRATORY_TRACT
  Filled 2014-02-05: qty 6

## 2014-02-05 NOTE — ED Provider Notes (Signed)
CSN: [id number]     Arrival date & time 02/05/14  2119 History  This chart was scribed for Laura SkeensJoshua M [name], MD by [name], ED Scribe. This patient was seen in room APA04/APA04 and the patient's care was started 10:22 PM.    Chief Complaint  Patient presents with  . Cough      The history is provided by the patient. No language interpreter was used.   HPI Comments: [name] is a 68 y.o. female with a history of HTN, hyperlipidemia who presents to the Emergency Department complaining of gradually worsening cough for the past 3 weeks. Patient reports she began to feel better from her Z-pack until her symptoms began worsening recently. She reports associated generalized body aches and SOB as symptoms. She states she has associated chest pressure that started tonight when she laid down. She states that her husband has recently been sick with a cough. She denies any similar previous episodes. She reports no surgeries in the past 6 weeks, no active CA, and no leg swelling.     Past Medical History  Diagnosis Date  . Hypertension   . Hyperlipidemia   . Hypothyroidism   . GERD (gastroesophageal reflux disease)   . PONV (postoperative nausea and vomiting)     Did not have this problem- 2010  . Orthostatic hypotension   . Arthritis     finger   . Current use of estrogen therapy 03/25/2013  . LBBB (left bundle branch block)   . H/O cardiac catheterization     a. 2011: normal coronary arteries (false positive stress test).   Past Surgical History  Procedure Laterality Date  . Partial hysterectomy    . Neck surgery      secondary to ruptured disc  . Colonoscopy  12/25/2010    Procedure: COLONOSCOPY;  Surgeon: Malissa HippoNajeeb U Rehman, MD;  Location: [facility];  Service: Endoscopy;  Laterality: N/A;  . Card[facility]      [facility] "nothing showed up"  . Abdominal hysterectomy    . Wrist fusion      Right- torn ligament  . Posterior cervical fusion/foraminotomy   12/18/2011    Procedure: POSTERIOR CERVICAL FUSION/FORAMINOTOMY LEVEL 2;  Surgeon: Hewitt Shortsobert W Nudelman, MD;  Location: [facility];  Service: Neurosurgery;  Laterality: Bilateral;  Cervical four to six Posterior cervical arthrodesis   Family History  Problem Relation Age of Onset  . Colon cancer Mother   . Cancer Mother     lung  . Diabetes Father   . Cancer Brother     back   History  Substance Use Topics  . Smoking status: Never Smoker   . Smokeless tobacco: Never Used  . Alcohol Use: No   OB History   Grav Para Term Preterm Abortions TAB SAB Ect Mult Living   1 1        1      Review of Systems  Respiratory: Positive for cough and shortness of breath.   All other systems reviewed and are negative.     Allergies  Codeine; Other; and Latex  Home Medications   Prior to Admission medications   Medication Sig Start Date End Date Taking? Authorizing Provider  ALPRAZolam (XANAX) 0.25 MG tablet Take 1 tablet (0.25 mg total) by mouth 3 (three) times daily as needed for anxiety. 12/13/13   Fredirick MaudlinEdward L Hawkins, MD  aspirin 81 MG tablet Take 81 mg by mouth at bedtime.     Historical Provider,  MD  calcium-vitamin D (OSCAL WITH D) 500-200 MG-UNIT per tablet Take 1 tablet by mouth at bedtime.     Historical Provider, MD  clopidogrel (PLAVIX) 75 MG tablet Take 1 tablet (75 mg total) by mouth daily. 12/07/13   Fredirick Maudlin, MD  fludrocortisone (FLORINEF) 0.1 MG tablet Take 2 tablets (0.2 mg total) by mouth daily. 12/12/13   Fredirick Maudlin, MD  HYDROcodone-acetaminophen (NORCO/VICODIN) 5-325 MG per tablet Take 1 tablet by mouth every 4 (four) hours as needed for pain. 08/05/12   Lyanne Co, MD  levothyroxine (SYNTHROID, LEVOTHROID) 75 MCG tablet Take 75 mcg by mouth daily.      Historical Provider, MD  lisinopril (PRINIVIL,ZESTRIL) 10 MG tablet Take 1 tablet (10 mg total) by mouth daily. 12/12/13   Fredirick Maudlin, MD  multivitamin-iron-minerals-folic acid (CENTRUM) chewable tablet  Chew 1 tablet by mouth at bedtime.     Historical Provider, MD  naproxen sodium (ANAPROX) 220 MG tablet Take 440 mg by mouth 2 (two) times daily as needed. For pain    Historical Provider, MD  pravastatin (PRAVACHOL) 20 MG tablet Take 1 tablet (20 mg total) by mouth daily. 12/07/13   Fredirick Maudlin, MD  VIVELLE-DOT 0.05 MG/24HR Place 1 patch onto the skin once a week.  04/24/10   Historical Provider, MD   BP 206/95  Pulse 80  Temp(Src) 97.7 F (36.5 C) (Oral)  Resp 16  Ht 5\' 4"  (1.626 m)  Wt 154 lb (69.854 kg)  BMI 26.42 kg/m2  SpO2 96% Physical Exam  Nursing note and vitals reviewed. Constitutional: She is oriented to person, place, and time. She appears well-developed and well-nourished.  HENT:  Head: Normocephalic and atraumatic.  Mouth/Throat: Oropharynx is clear and moist.  Cardiovascular: Normal rate, regular rhythm and normal heart sounds.   Pulmonary/Chest: Effort normal.  Crackles at the bases bilaterally Coughing in the room   Abdominal: Soft. She exhibits no distension.  Musculoskeletal: Normal range of motion. She exhibits no edema.  Neurological: She is alert and oriented to person, place, and time.  Skin: Skin is warm and dry.  Psychiatric: She has a normal mood and affect.    ED Course  Procedures  DIAGNOSTIC STUDIES: Oxygen Saturation is 96% on RA, normal by my interpretation.    COORDINATION OF CARE: 10:29 PM Discussed treatment plan with pt at bedside and pt agreed to plan.   Labs Review Labs Reviewed  BASIC METABOLIC PANEL - Abnormal; Notable for the following:    Glucose, Bld 100 (*)    GFR calc non Af Amer 85 (*)    All other components within normal limits  PRO B NATRIURETIC PEPTIDE - Abnormal; Notable for the following:    Pro B Natriuretic peptide (BNP) 2140.0 (*)    All other components within normal limits  TSH - Abnormal; Notable for the following:    TSH 6.000 (*)    All other components within normal limits  BASIC METABOLIC PANEL -  Abnormal; Notable for the following:    Potassium 3.5 (*)    GFR calc non Af Amer 86 (*)    All other components within normal limits  TROPONIN I  CBC WITH DIFFERENTIAL  TROPONIN I  TROPONIN I  TROPONIN I  LIPID PANEL    Imaging Review Dg Chest 2 View  02/05/2014   CLINICAL DATA:  68 year old female with acute shortness of breath wheezing and nonproductive cough. Associated chest pain. Diagnosed with bronchitis 2 weeks ago. Initial encounter.  EXAM: CHEST  2 VIEW  COMPARISON:  12/08/2013 and earlier.  FINDINGS: Diffusely increased interstitial markings. Basilar predominance. Also linear markings along the periphery of both lungs suggesting Kerley B-lines. Normal cardiac size and mediastinal contours. Visualized tracheal air column is within normal limits. No pneumothorax. No definite pleural effusion. Partially visible anterior and posterior lower cervical spine hardware. No acute osseous abnormality identified.  IMPRESSION: Abnormal increased interstitial opacity diffusely. Top differential considerations include acute interstitial edema vs. acute viral/atypical pneumonia.   Electronically Signed   By: Augusto GambleLee  Hall M.D.   On: 02/05/2014 22:03     EKG Interpretation   Date/Time:  Sunday February 05 2014 22:33:36 EDT Ventricular Rate:  80 PR Interval:  167 QRS Duration: 139 QT Interval:  440 QTC Calculation: 508 R Axis:   11 Text Interpretation:  Sinus rhythm Left bundle branch block Baseline  wander in lead(s) V5 Confirmed by Lonetta Blassingame  MD, Rahaf Carbonell ([id number]) on 02/05/2014  11:59:00 PM      MDM   Final diagnoses:  Acute dyspnea  Pulmonary edema, acute  URI (upper respiratory infection)  LBBB (left bundle branch block)    I personally performed the services described in this documentation, which was scribed in my presence. The recorded information has been reviewed and is accurate.  Patient with worsening cough, shortness of breath and chest pressure. Clinically most likely  bronchitis/pneumonia however with worsening chest pressure worse with lying flat, age and cardiac risk factors including high blood pressure and lipids plan for screening for heart failure or coronary artery event. Patient has no risk factors for pulmonary embolism. Blood pressure significantly elevated, nitroglycerin pending. Patient well-appearing otherwise in ER.  The patients results and plan were reviewed and discussed.   Any x-rays performed were personally reviewed by myself.   Differential diagnosis were considered with the presenting HPI.  Medications  albuterol (PROVENTIL) (2.5 MG/3ML) 0.083% nebulizer solution 5 mg (5 mg Nebulization Given 02/05/14 2251)  aspirin chewable tablet 324 mg (324 mg Oral Given 02/05/14 2332)  furosemide (LASIX) injection 20 mg (20 mg Intravenous Given 02/06/14 0037)    Filed Vitals:   02/06/14 1937 02/07/14 0128 02/07/14 0658 02/07/14 0706  BP: 180/71 170/65    Pulse: 72 71 70   Temp: 98.5 F (36.9 C)     TempSrc: Oral  Oral   Resp: 20 16 20    Height:      Weight:    150 lb 11.2 oz (68.357 kg)  SpO2: 97%       Final diagnoses:  Acute dyspnea  Pulmonary edema, acute  URI (upper respiratory infection)  LBBB (left bundle branch block)    Admission/ observation were discussed with the admitting physician, patient and/or family and they are comfortable with the plan.    Laura SkeensJoshua M Laramie Gelles, MD 02/08/14 2312

## 2014-02-05 NOTE — ED Notes (Signed)
Patient reports cough for approximately 4 weeks that has worsened.

## 2014-02-06 ENCOUNTER — Encounter (HOSPITAL_COMMUNITY): Payer: Self-pay | Admitting: Cardiology

## 2014-02-06 DIAGNOSIS — R0600 Dyspnea, unspecified: Secondary | ICD-10-CM

## 2014-02-06 DIAGNOSIS — I059 Rheumatic mitral valve disease, unspecified: Secondary | ICD-10-CM

## 2014-02-06 DIAGNOSIS — I10 Essential (primary) hypertension: Secondary | ICD-10-CM

## 2014-02-06 DIAGNOSIS — I160 Hypertensive urgency: Secondary | ICD-10-CM | POA: Diagnosis present

## 2014-02-06 DIAGNOSIS — I5033 Acute on chronic diastolic (congestive) heart failure: Secondary | ICD-10-CM | POA: Diagnosis present

## 2014-02-06 LAB — LIPID PANEL
Cholesterol: 140 mg/dL (ref 0–200)
HDL: 66 mg/dL (ref >39)
LDL CALC: 54 mg/dL (ref 0–99)
Total CHOL/HDL Ratio: 2.1 RATIO
Triglycerides: 100 mg/dL (ref <150)
VLDL: 20 mg/dL (ref 0–40)

## 2014-02-06 LAB — TSH: TSH: 6 u[IU]/mL — ABNORMAL HIGH (ref 0.350–4.500)

## 2014-02-06 LAB — TROPONIN I
Troponin I: <0.3 ng/mL (ref <0.30)
Troponin I: <0.3 ng/mL (ref <0.30)

## 2014-02-06 MED ORDER — SODIUM CHLORIDE 0.9 % IJ SOLN: 3.0000 mL | INTRAMUSCULAR | Status: DC | PRN

## 2014-02-06 MED ORDER — CLOPIDOGREL BISULFATE 75 MG PO TABS
75.0000 mg | ORAL_TABLET | Freq: Every day | ORAL | Status: DC
Start: 2014-02-06 — End: 2014-02-07
  Administered 2014-02-06 – 2014-02-07 (×2): 75 mg via ORAL
  Filled 2014-02-06 (×2): qty 1

## 2014-02-06 MED ORDER — SODIUM CHLORIDE 0.9 % IJ SOLN
3.0000 mL | Freq: Two times a day (BID) | INTRAMUSCULAR | Status: DC
  Administered 2014-02-06 – 2014-02-07 (×4): 3 mL via INTRAVENOUS

## 2014-02-06 MED ORDER — ACETAMINOPHEN 325 MG PO TABS
650.0000 mg | ORAL_TABLET | ORAL | Status: DC | PRN
  Administered 2014-02-07: 650 mg via ORAL
  Filled 2014-02-06: qty 2

## 2014-02-06 MED ORDER — HEPARIN SODIUM (PORCINE) 5000 UNIT/ML IJ SOLN
5000.0000 [IU] | Freq: Three times a day (TID) | INTRAMUSCULAR | Status: DC
  Administered 2014-02-06 – 2014-02-07 (×4): 5000 [IU] via SUBCUTANEOUS
  Filled 2014-02-06 (×4): qty 1

## 2014-02-06 MED ORDER — LISINOPRIL 10 MG PO TABS
10.0000 mg | ORAL_TABLET | Freq: Every day | ORAL | Status: DC
  Filled 2014-02-06: qty 1

## 2014-02-06 MED ORDER — HYDROCODONE-ACETAMINOPHEN 5-325 MG PO TABS: 1.0000 | ORAL_TABLET | ORAL | Status: DC | PRN

## 2014-02-06 MED ORDER — ASPIRIN 81 MG PO CHEW
81.0000 mg | CHEWABLE_TABLET | Freq: Every day | ORAL | Status: DC
  Administered 2014-02-06: 81 mg via ORAL
  Filled 2014-02-06: qty 1

## 2014-02-06 MED ORDER — ADULT MULTIVITAMIN W/MINERALS CH
1.0000 | ORAL_TABLET | Freq: Every day | ORAL | Status: DC
  Administered 2014-02-06: 1 via ORAL
  Filled 2014-02-06: qty 1

## 2014-02-06 MED ORDER — ONDANSETRON HCL 4 MG/2ML IJ SOLN: 4.0000 mg | Freq: Four times a day (QID) | INTRAMUSCULAR | Status: DC | PRN

## 2014-02-06 MED ORDER — HEPARIN SODIUM (PORCINE) 5000 UNIT/ML IJ SOLN: 5000.0000 [IU] | Freq: Three times a day (TID) | INTRAMUSCULAR | Status: DC

## 2014-02-06 MED ORDER — LOSARTAN POTASSIUM 50 MG PO TABS
100.0000 mg | ORAL_TABLET | Freq: Every day | ORAL | Status: DC
  Administered 2014-02-06 – 2014-02-07 (×2): 100 mg via ORAL
  Filled 2014-02-06 (×2): qty 2

## 2014-02-06 MED ORDER — ESTRADIOL 0.05 MG/24HR TD PTWK
0.0500 mg | MEDICATED_PATCH | TRANSDERMAL | Status: DC
  Administered 2014-02-06: 0.05 mg via TRANSDERMAL
  Filled 2014-02-06: qty 1

## 2014-02-06 MED ORDER — NAPROXEN 250 MG PO TABS: 500.0000 mg | ORAL_TABLET | Freq: Two times a day (BID) | ORAL | Status: DC | PRN

## 2014-02-06 MED ORDER — FLUDROCORTISONE ACETATE 0.1 MG PO TABS
0.1000 mg | ORAL_TABLET | Freq: Every day | ORAL | Status: DC
  Administered 2014-02-07: 0.1 mg via ORAL
  Filled 2014-02-06 (×2): qty 1

## 2014-02-06 MED ORDER — FLUDROCORTISONE ACETATE 0.1 MG PO TABS
0.2000 mg | ORAL_TABLET | Freq: Every day | ORAL | Status: DC
  Administered 2014-02-06: 0.2 mg via ORAL
  Filled 2014-02-06 (×3): qty 2

## 2014-02-06 MED ORDER — LEVOTHYROXINE SODIUM 75 MCG PO TABS
75.0000 ug | ORAL_TABLET | Freq: Every day | ORAL | Status: DC
  Administered 2014-02-06 – 2014-02-07 (×2): 75 ug via ORAL
  Filled 2014-02-06 (×2): qty 1

## 2014-02-06 MED ORDER — NAPROXEN SODIUM 275 MG PO TABS
550.0000 mg | ORAL_TABLET | Freq: Two times a day (BID) | ORAL | Status: DC | PRN
  Filled 2014-02-06: qty 2

## 2014-02-06 MED ORDER — PRAVASTATIN SODIUM 10 MG PO TABS
20.0000 mg | ORAL_TABLET | Freq: Every day | ORAL | Status: DC
  Administered 2014-02-06: 20 mg via ORAL
  Filled 2014-02-06 (×2): qty 1
  Filled 2014-02-06: qty 2

## 2014-02-06 MED ORDER — CARVEDILOL 3.125 MG PO TABS
3.1250 mg | ORAL_TABLET | Freq: Two times a day (BID) | ORAL | Status: DC
  Administered 2014-02-06 – 2014-02-07 (×2): 3.125 mg via ORAL
  Filled 2014-02-06 (×2): qty 1

## 2014-02-06 MED ORDER — ALPRAZOLAM 0.25 MG PO TABS: 0.2500 mg | ORAL_TABLET | Freq: Three times a day (TID) | ORAL | Status: DC | PRN

## 2014-02-06 MED ORDER — CALCIUM CARBONATE-VITAMIN D 500-200 MG-UNIT PO TABS
1.0000 | ORAL_TABLET | Freq: Every day | ORAL | Status: DC
  Administered 2014-02-06: 1 via ORAL
  Filled 2014-02-06: qty 1

## 2014-02-06 MED ORDER — SODIUM CHLORIDE 0.9 % IV SOLN: 250.0000 mL | INTRAVENOUS | Status: DC | PRN

## 2014-02-06 NOTE — Progress Notes (Signed)
UR completed 

## 2014-02-06 NOTE — Progress Notes (Signed)
  Echocardiogram 2D Echocardiogram has been performed.  Burleigh Brockmann 02/06/2014, 3:54 PM

## 2014-02-06 NOTE — H&P (Signed)
[facility] History and Physical  [name]:[id number] DOB: October 16, 1945 DOA: 02/05/2014  Referring physician: EDP PCP: Fredirick Maudlin, MD   Chief Complaint: Chest pressure   HPI: [name] is a 68 y.o. female h/o HTN, hyperlipidemia, who presents to the ED with c/o gradually worsening cough for the past 3 weeks.  Husband had been ill with similar symptoms.  After being started on azithromycin and cough medication.  She finally started to feel better until today when she developed new onset chest pressure and SOB which prompted her to come to the ED.  Review of Systems: Systems reviewed.  As above, otherwise negative  Past Medical History  Diagnosis Date  . Hypertension   . Hyperlipidemia   . Hypothyroidism   . GERD (gastroesophageal reflux disease)   . PONV (postoperative nausea and vomiting)     Did not have this problem- 2010  . Orthostatic hypotension   . Arthritis     finger   . Current use of estrogen therapy 03/25/2013  . LBBB (left bundle branch block)   . H/O cardiac catheterization     a. 2011: normal coronary arteries (false positive stress test).   Past Surgical History  Procedure Laterality Date  . Partial hysterectomy    . Neck surgery      secondary to ruptured disc  . Colonoscopy  12/25/2010    Procedure: COLONOSCOPY;  Surgeon: Malissa Hippo, MD;  Location: [facility];  Service: Endoscopy;  Laterality: N/A;  . Cardiac catheterization      x 2 at [facility] "nothing showed up"  . Abdominal hysterectomy    . Wrist fusion      Right- torn ligament  . Posterior cervical fusion/foraminotomy  12/18/2011    Procedure: POSTERIOR CERVICAL FUSION/FORAMINOTOMY LEVEL 2;  Surgeon: Hewitt Shorts, MD;  Location: [facility];  Service: Neurosurgery;  Laterality: Bilateral;  Cervical four to six Posterior cervical arthrodesis   Social History:  reports that she has never smoked. She has never used smokeless tobacco. She reports that she does  not drink alcohol or use illicit drugs.  Allergies  Allergen Reactions  . Codeine Nausea And Vomiting  . Other Nausea And Vomiting    Most anesthesia   . Latex Rash    Family History  Problem Relation Age of Onset  . Colon cancer Mother   . Cancer Mother     lung  . Diabetes Father   . Cancer Brother     back     Prior to Admission medications   Medication Sig Start Date End Date Taking? Authorizing Provider  ALPRAZolam (XANAX) 0.25 MG tablet Take 1 tablet (0.25 mg total) by mouth 3 (three) times daily as needed for anxiety. 12/13/13   Fredirick Maudlin, MD  aspirin 81 MG tablet Take 81 mg by mouth at bedtime.     Historical Provider, MD  calcium-vitamin D (OSCAL WITH D) 500-200 MG-UNIT per tablet Take 1 tablet by mouth at bedtime.     Historical Provider, MD  clopidogrel (PLAVIX) 75 MG tablet Take 1 tablet (75 mg total) by mouth daily. 12/07/13   Fredirick Maudlin, MD  fludrocortisone (FLORINEF) 0.1 MG tablet Take 2 tablets (0.2 mg total) by mouth daily. 12/12/13   Fredirick Maudlin, MD  HYDROcodone-acetaminophen (NORCO/VICODIN) 5-325 MG per tablet Take 1 tablet by mouth every 4 (four) hours as needed for pain. 08/05/12   Lyanne Co, MD  levothyroxine (SYNTHROID, LEVOTHROID) 75 MCG tablet Take 75  mcg by mouth daily.      Historical Provider, MD  lisinopril (PRINIVIL,ZESTRIL) 10 MG tablet Take 1 tablet (10 mg total) by mouth daily. 12/12/13   Fredirick MaudlinEdward L Hawkins, MD  multivitamin-iron-minerals-folic acid (CENTRUM) chewable tablet Chew 1 tablet by mouth at bedtime.     Historical Provider, MD  naproxen sodium (ANAPROX) 220 MG tablet Take 440 mg by mouth 2 (two) times daily as needed. For pain    Historical Provider, MD  pravastatin (PRAVACHOL) 20 MG tablet Take 1 tablet (20 mg total) by mouth daily. 12/07/13   Fredirick MaudlinEdward L Hawkins, MD  VIVELLE-DOT 0.05 MG/24HR Place 1 patch onto the skin once a week.  04/24/10   Historical Provider, MD   Physical Exam: Filed Vitals:   02/05/14 2330  BP:  173/73  Pulse: 90  Temp:   Resp: 22    BP 173/73  Pulse 90  Temp(Src) 97.7 F (36.5 C) (Oral)  Resp 22  Ht 5\' 4"  (1.626 m)  Wt 69.854 kg (154 lb)  BMI 26.42 kg/m2  SpO2 95%  General Appearance:    Alert, oriented, no distress, appears stated age  Head:    Normocephalic, atraumatic  Eyes:    PERRL, EOMI, sclera non-icteric        Nose:   Nares without drainage or epistaxis. Mucosa, turbinates normal  Throat:   Moist mucous membranes. Oropharynx without erythema or exudate.  Neck:   Supple. No carotid bruits.  No thyromegaly.  No lymphadenopathy.   Back:     No CVA tenderness, no spinal tenderness  Lungs:     Bi-basilar crackles  Chest wall:    No tenderness to palpitation  Heart:    Regular rate and rhythm without murmurs, gallops, rubs  Abdomen:     Soft, non-tender, nondistended, normal bowel sounds, no organomegaly  Genitalia:    deferred  Rectal:    deferred  Extremities:   No clubbing, cyanosis or edema.  Pulses:   2+ and symmetric all extremities  Skin:   Skin color, texture, turgor normal, no rashes or lesions  Lymph nodes:   Cervical, supraclavicular, and axillary nodes normal  Neurologic:   CNII-XII intact. Normal strength, sensation and reflexes      throughout    Labs on Admission:  Basic Metabolic Panel:  Recent Labs Lab 02/05/14 2235  NA 146  K 3.7  CL 107  CO2 29  GLUCOSE 100*  BUN 18  CREATININE 0.78  CALCIUM 9.5   Liver Function Tests: No results found for this basename: AST, ALT, ALKPHOS, BILITOT, PROT, ALBUMIN,  in the last 168 hours No results found for this basename: LIPASE, AMYLASE,  in the last 168 hours No results found for this basename: AMMONIA,  in the last 168 hours CBC:  Recent Labs Lab 02/05/14 2235  WBC 5.9  NEUTROABS 3.3  HGB 13.1  HCT 38.0  MCV 90.0  PLT 222   Cardiac Enzymes:  Recent Labs Lab 02/05/14 2235  TROPONINI <0.30    BNP (last 3 results)  Recent Labs  02/05/14 2235  PROBNP 2140.0*   CBG: No  results found for this basename: GLUCAP,  in the last 168 hours  Radiological Exams on Admission: Dg Chest 2 View  02/05/2014   CLINICAL DATA:  68 year old female with acute shortness of breath wheezing and nonproductive cough. Associated chest pain. Diagnosed with bronchitis 2 weeks ago. Initial encounter.  EXAM: CHEST  2 VIEW  COMPARISON:  12/08/2013 and earlier.  FINDINGS: Diffusely increased interstitial  markings. Basilar predominance. Also linear markings along the periphery of both lungs suggesting Kerley B-lines. Normal cardiac size and mediastinal contours. Visualized tracheal air column is within normal limits. No pneumothorax. No definite pleural effusion. Partially visible anterior and posterior lower cervical spine hardware. No acute osseous abnormality identified.  IMPRESSION: Abnormal increased interstitial opacity diffusely. Top differential considerations include acute interstitial edema vs. acute viral/atypical pneumonia.   Electronically Signed   By: Augusto GambleLee  Hall M.D.   On: 02/05/2014 22:03    EKG: Independently reviewed.  Assessment/Plan Principal Problem:   Hypertensive urgency Active Problems:   HYPERTENSION, BENIGN   LBBB (left bundle branch block)   Acute on chronic diastolic CHF (congestive heart failure), NYHA class 1   1. HTN urgency causing acute on chronic diastolic CHF - DDX includes atypical viral PNA, or ACS 1. Husband will check what "cough medication" she was taking at home specifically and wether it contained phenylephrine or another medication well known to cause elevation of blood pressure.  I am suspicious that this may have been what caused today's presentation if so. 2. Continue home BP meds 3. BP coming down in ED with SL NTG, adding NTG paste 4. Has grade 2 diastolic CHF with normal EF on Echo back in august of this year 5. Continue home ASA and plavix 6. Serial trops ordered 7. Tele monitor   Code Status: Full Code  Family Communication: Husband at  bedside Disposition Plan: Admit to obs   Time spent: 70 min  GARDNER, JARED M. [facility] Pager [phone]  If 7AM-7PM, please contact the day team taking care of the patient [email or web address] Password [email or web address] 02/06/2014, 12:21 AM

## 2014-02-06 NOTE — Care Management Note (Addendum)
    Page 1 of 1   02/07/2014     12:53:25 PM CARE MANAGEMENT NOTE 02/07/2014  Patient:  [name],[name]   Account Number:  [id number]  Date Initiated:  02/06/2014  Documentation initiated by:  Sharrie RothmanBLACKWELL,Laura Parthasarathy C  Subjective/Objective Assessment:   Pt admitted from home with cough and CP. Pt lives with her husband and will return home at discharge. Pt is independent with ADL's.     Action/Plan:   No CM needs noted.   Anticipated DC Date:  02/07/2014   Anticipated DC Plan:  HOME/SELF CARE      DC Planning Services  CM consult      Choice offered to / List presented to:             Status of service:  Completed, signed off Medicare Important Message given?   (If response is "NO", the following Medicare IM given date fields will be blank) Date Medicare IM given:   Medicare IM given by:   Date Additional Medicare IM given:   Additional Medicare IM given by:    Discharge Disposition:  HOME/SELF CARE  Per UR Regulation:    If discussed at Long Length of Stay Meetings, dates discussed:    Comments:  02/07/14 1250 Laura Queenammy Jalyn Rosero, RN BSN CM Pt discharged home today. No CM needs noted.  02/06/14 1140 Laura Queenammy Kendrix Orman, RN BSN CM

## 2014-02-06 NOTE — Consult Note (Signed)
CARDIOLOGY CONSULT NOTE   Patient ID: [name] MRN: [id number] DOB/AGE: [birth year] 68 y.o.  Admit Date: 02/05/2014 Referring Physician: Kari BaarsHawkins, Edward MD Primary Physician: Fredirick MaudlinHAWKINS,EDWARD L, MD Consulting Cardiologist: Nona DellMcDowell,Sophia Sperry MD Primary Cardiologist: Nona DellMcDowell, Preslynn Bier MD Reason for Consultation: Hypertensive Urgency-Diastolic CHF   Clinical Summary [name] is a 68 y.o.female with history outlined below who presented to ER with complaints of chest pressure, dyspnea, and worsening cough. She was treated by PCP with azithromycin and cough suppressants about one month ago, symptoms initially got better, but then worsened. She most recently has been taking Delsym..    She states that on the day of admission she was having significant PND and orthopnea. Pressure in her chest made it difficult to take deep breaths. She endorsed some upper airway wheezes prior to coming to ER, but no productive coughing, fever, dizziness.   BP on admission 206/95 (not typical for her), HR 80, O2 Sat 96%. K-3.7. Pro-BNP 2140. She was treated with neb tx, SL NTG, ASA 81 mg, and lasix 20 mg IV. Troponin negative X 3.  She has lost 2 lbs if wts are accurate, but I/O has not been completed. She is feeling much better.    Allergies  Allergen Reactions  . Codeine Nausea And Vomiting  . Other Nausea And Vomiting    Most anesthesia   . Latex Rash    Medications Scheduled Medications: . aspirin  81 mg Oral QHS  . calcium-vitamin D  1 tablet Oral QHS  . clopidogrel  75 mg Oral Daily  . estradiol  0.05 mg Transdermal Weekly  . fludrocortisone  0.2 mg Oral Daily  . heparin  5,000 Units Subcutaneous 3 times per day  . levothyroxine  75 mcg Oral QAC breakfast  . losartan  100 mg Oral Daily  . multivitamin with minerals  1 tablet Oral QHS  . nitroGLYCERIN  1 inch Topical Once  . pravastatin  20 mg Oral q1800  . sodium chloride  3 mL Intravenous Q12H    PRN Medications: sodium chloride,  acetaminophen, ALPRAZolam, HYDROcodone-acetaminophen, naproxen, nitroGLYCERIN, ondansetron (ZOFRAN) IV, sodium chloride   Past Medical History  Diagnosis Date  . Essential hypertension   . Hyperlipidemia   . Hypothyroidism   . GERD (gastroesophageal reflux disease)   . Orthostatic hypotension   . Arthritis   . Current use of estrogen therapy 03/25/2013  . LBBB (left bundle branch block)   . H/O cardiac catheterization     2011: normal coronary arteries (false positive stress test).  Marland Kitchen. TIA (transient ischemic attack)     On Plavix    Past Surgical History  Procedure Laterality Date  . Partial hysterectomy    . Neck surgery      Secondary to ruptured disc  . Colonoscopy  12/25/2010    Procedure: COLONOSCOPY;  Surgeon: Malissa HippoNajeeb U Rehman, MD;  Location: [facility];  Service: Endoscopy;  Laterality: N/A;  . Abdominal hysterectomy    . Wrist fusion      Right- torn ligament  . Posterior cervical fusion/foraminotomy  12/18/2011    Procedure: POSTERIOR CERVICAL FUSION/FORAMINOTOMY LEVEL 2;  Surgeon: Hewitt Shortsobert W Nudelman, MD;  Location: [facility];  Service: Neurosurgery;  Laterality: Bilateral;  Cervical four to six Posterior cervical arthrodesis    Family History  Problem Relation Age of Onset  . Colon cancer Mother   . Lung cancer Mother   . Diabetes Father   . Cancer Brother     Back  Social History [name] reports that she has never smoked. She has never used smokeless tobacco. [name] reports that she does not drink alcohol.  Review of Systems Otherwise reviewed and negative except as outlined.  Physical Examination Blood pressure 177/60, pulse 72, temperature 97.5 F (36.4 C), temperature source Oral, resp. rate 20, height 5\' 4"  (1.626 m), weight 152 lb 6.4 oz (69.128 kg), SpO2 95.00%. No intake or output data in the 24 hours ending 02/06/14 0913  Telemetry: NSR with LBBB (old).  GEN: Resting comfortably, no dyspnea HEENT: Conjunctiva and lids normal,  oropharynx clear with moist mucosa. Neck: Supple, no elevated JVP or carotid bruits, no thyromegaly. Lungs: Some bilateral crackles, no wheezes. Cardiac: Regular rate and rhythm, no S3 or significant systolic murmur, no pericardial rub. Abdomen: Soft, nontender, no hepatomegaly, bowel sounds present, no guarding or rebound. Extremities: No pitting edema, distal pulses 2+. Skin: Warm and dry. Musculoskeletal: No kyphosis. Neuropsychiatric: Alert and oriented x3, affect grossly appropriate.   Prior Cardiac Testing/Procedures Echocardiogram 12/06/2013 Left ventricle: The cavity size was normal. Wall thickness was increased in a pattern of mild LVH. Systolic function was normal. The estimated ejection fraction was in the range of 55% to 60%. Wall motion was normal; there were no regional wall motion abnormalities. Features are consistent with a pseudonormal left ventricular filling pattern, with concomitant abnormal relaxation and increased filling pressure (grade 2 diastolic dysfunction). - Aortic valve: Mildly calcified annulus. Trileaflet. - Mitral valve: Calcified annulus. There was trivial regurgitation. - Left atrium: The atrium was mildly dilated. - Right atrium: Central venous pressure (est): 3 mm Hg. - Atrial septum: No defect or patent foramen ovale was identified. - Tricuspid valve: There was trivial regurgitation. - Pulmonary arteries: Systolic pressure could not be accurately estimated. - Pericardium, extracardiac: A small pericardial effusion was identified along the right ventricular free wall.   Lab Results  Basic Metabolic Panel:  Recent Labs Lab 02/05/14 2235  NA 146  K 3.7  CL 107  CO2 29  GLUCOSE 100*  BUN 18  CREATININE 0.78  CALCIUM 9.5     CBC:  Recent Labs Lab 02/05/14 2235  WBC 5.9  NEUTROABS 3.3  HGB 13.1  HCT 38.0  MCV 90.0  PLT 222    Cardiac Enzymes:  Recent Labs Lab 02/05/14 2235 02/06/14 0027 02/06/14 0617  TROPONINI  <0.30 <0.30 <0.30    BNP: 2,140  Radiology: Dg Chest 2 View  02/05/2014   CLINICAL DATA:  68 year old female with acute shortness of breath wheezing and nonproductive cough. Associated chest pain. Diagnosed with bronchitis 2 weeks ago. Initial encounter.  EXAM: CHEST  2 VIEW  COMPARISON:  12/08/2013 and earlier.  FINDINGS: Diffusely increased interstitial markings. Basilar predominance. Also linear markings along the periphery of both lungs suggesting Kerley B-lines. Normal cardiac size and mediastinal contours. Visualized tracheal air column is within normal limits. No pneumothorax. No definite pleural effusion. Partially visible anterior and posterior lower cervical spine hardware. No acute osseous abnormality identified.  IMPRESSION: Abnormal increased interstitial opacity diffusely. Top differential considerations include acute interstitial edema vs. acute viral/atypical pneumonia.   Electronically Signed   By: Augusto Gamble M.D.   On: 02/05/2014 22:03    Impression and Recommendations  1. Acute Dyspnea: Possible diastolic CHF in the setting of hypertensive urgency, resolved with IV lasix, and neb tx. . She denied any LE edema, but had significant dyspnea and orthopnea was associated coughing and chest pressure. Repeat echo, most  recent study in August of 2015.  She is having new symptoms with known grade 2 diastolic dysfunction. Follow BMET.  2. Hypertensive Urgency with known history of hypertension: Significantly hypertensive on admission. Review of home mediations has her on NSAIDS daily. She has had a history of orthostatic hypotension in the past which has resolved with use of florinef.. On lisinopril which may possibly be causing coughing. Changed to ARB. Losartan 100mg  daily and consider adding HCTZ. Repeat echo  3. Hx of TIA: Continues on DAPT per neurology.   4. Hypothyroidism: Check TSH   5. Hypercholestrolemia: Check status.    Signed: Bettey MareKathryn M. Lawrence NP  02/06/2014, 9:13  AM Co-Sign MD   Attending note:  Patient seen and examined. Reviewed records and modified above note by Ms. Lawrence NP [name] presents having experienced progressive shortness of breath and most recently a feeling of tightness and orthopnea. She states that about one month ago she was diagnosed with "bronchitis", symptoms at that time including a sore throat and chest congestion with cough. She was treated with antibiotics and steroids. Initially had improvement, but symptoms worsened with continued cough. Most recently she has been taking Delsym as a cough suppressant. Noted to be significantly hypertensive which is not typically the case for her, chest x-ray with interstitial edema versus atypical pneumonia pattern. She has not been febrile, no elevated white blood cell count. She has had symptomatically improved in the hospital with IV Lasix and also nebulizer treatments. Cardiac markers argue against ACS. Pro-BNP elevated at 2140. She has been changed from lisinopril to ARB in light of cough. Otherwise continues on Florinef with history of orthostasis (presently not orthostatic). Plan is to followup echocardiogram to ensure no significant change in cardiac structure and function. Otherwise would tend to focus on blood pressure control, probably avoid long-term diuretic use unless absolutely necessary. No clear indication for ischemic workup at this point.  Jonelle SidleSamuel G. Aleeya Veitch, M.D., F.A.C.C.

## 2014-02-06 NOTE — Progress Notes (Signed)
Subjective: She was admitted last night with a cough elevated pro BNP and chest x-ray that looks like she may have volume overload/CHF. She has previous history of severe orthostatic hypotension and that has improved with treatment. Her she says she's had a significant cough for some time and that may be related to lisinopril she takes for hypertension. She has had a difficult time with balancing her orthostasis versus her hypertension.  Objective: Vital signs in last 24 hours: Temp:  [97.5 F (36.4 C)-97.7 F (36.5 C)] 97.5 F (36.4 C) (10/19 0137) Pulse Rate:  [72-90] 72 (10/19 0137) Resp:  [16-22] 20 (10/19 0137) BP: (153-206)/(60-95) 177/60 mmHg (10/19 0137) SpO2:  [89 %-100 %] 95 % (10/19 0137) FiO2 (%):  [21 %] 21 % (10/19 0008) Weight:  [69.128 kg (152 lb 6.4 oz)-69.854 kg (154 lb)] 69.128 kg (152 lb 6.4 oz) (10/19 0630) Weight change:  Last BM Date: 02/05/14  Intake/Output from previous day:    PHYSICAL EXAM General appearance: alert, cooperative and no distress Resp: clear to auscultation bilaterally Cardio: regular rate and rhythm, S1, S2 normal, no murmur, click, rub or gallop GI: soft, non-tender; bowel sounds normal; no masses,  no organomegaly Extremities: extremities normal, atraumatic, no cyanosis or edema  Lab Results:  Results for orders placed during the hospital encounter of 02/05/14 (from the past 48 hour(s))  TROPONIN I     Status: None   Collection Time    02/05/14 10:35 PM      Result Value Ref Range   Troponin I <0.30  <0.30 ng/mL   Comment:            Due to the release kinetics of cTnI,     a negative result within the first hours     of the onset of symptoms does not rule out     myocardial infarction with certainty.     If myocardial infarction is still suspected,     repeat the test at appropriate intervals.  BASIC METABOLIC PANEL     Status: Abnormal   Collection Time    02/05/14 10:35 PM      Result Value Ref Range   Sodium 146  137 - 147  mEq/L   Potassium 3.7  3.7 - 5.3 mEq/L   Chloride 107  96 - 112 mEq/L   CO2 29  19 - 32 mEq/L   Glucose, Bld 100 (*) 70 - 99 mg/dL   BUN 18  6 - 23 mg/dL   Creatinine, Ser 0.78  0.50 - 1.10 mg/dL   Calcium 9.5  8.4 - 10.5 mg/dL   GFR calc non Af Amer 85 (*) >90 mL/min   GFR calc Af Amer >90  >90 mL/min   Comment: (NOTE)     The eGFR has been calculated using the CKD EPI equation.     This calculation has not been validated in all clinical situations.     eGFR's persistently <90 mL/min signify possible Chronic Kidney     Disease.   Anion gap 10  5 - 15  CBC WITH DIFFERENTIAL     Status: None   Collection Time    02/05/14 10:35 PM      Result Value Ref Range   WBC 5.9  4.0 - 10.5 K/uL   RBC 4.22  3.87 - 5.11 MIL/uL   Hemoglobin 13.1  12.0 - 15.0 g/dL   HCT 38.0  36.0 - 46.0 %   MCV 90.0  78.0 - 100.0 fL  MCH 31.0  26.0 - 34.0 pg   MCHC 34.5  30.0 - 36.0 g/dL   RDW 13.3  11.5 - 15.5 %   Platelets 222  150 - 400 K/uL   Neutrophils Relative % 56  43 - 77 %   Neutro Abs 3.3  1.7 - 7.7 K/uL   Lymphocytes Relative 33  12 - 46 %   Lymphs Abs 1.9  0.7 - 4.0 K/uL   Monocytes Relative 6  3 - 12 %   Monocytes Absolute 0.3  0.1 - 1.0 K/uL   Eosinophils Relative 4  0 - 5 %   Eosinophils Absolute 0.2  0.0 - 0.7 K/uL   Basophils Relative 1  0 - 1 %   Basophils Absolute 0.1  0.0 - 0.1 K/uL  PRO B NATRIURETIC PEPTIDE     Status: Abnormal   Collection Time    02/05/14 10:35 PM      Result Value Ref Range   Pro B Natriuretic peptide (BNP) 2140.0 (*) 0 - 125 pg/mL  TROPONIN I     Status: None   Collection Time    02/06/14 12:27 AM      Result Value Ref Range   Troponin I <0.30  <0.30 ng/mL   Comment:            Due to the release kinetics of cTnI,     a negative result within the first hours     of the onset of symptoms does not rule out     myocardial infarction with certainty.     If myocardial infarction is still suspected,     repeat the test at appropriate intervals.  TROPONIN  I     Status: None   Collection Time    02/06/14  6:17 AM      Result Value Ref Range   Troponin I <0.30  <0.30 ng/mL   Comment:            Due to the release kinetics of cTnI,     a negative result within the first hours     of the onset of symptoms does not rule out     myocardial infarction with certainty.     If myocardial infarction is still suspected,     repeat the test at appropriate intervals.    ABGS No results found for this basename: PHART, PCO2, PO2ART, TCO2, HCO3,  in the last 72 hours CULTURES No results found for this or any previous visit (from the past 240 hour(s)). Studies/Results: Dg Chest 2 View  02/05/2014   CLINICAL DATA:  68 year old female with acute shortness of breath wheezing and nonproductive cough. Associated chest pain. Diagnosed with bronchitis 2 weeks ago. Initial encounter.  EXAM: CHEST  2 VIEW  COMPARISON:  12/08/2013 and earlier.  FINDINGS: Diffusely increased interstitial markings. Basilar predominance. Also linear markings along the periphery of both lungs suggesting Kerley B-lines. Normal cardiac size and mediastinal contours. Visualized tracheal air column is within normal limits. No pneumothorax. No definite pleural effusion. Partially visible anterior and posterior lower cervical spine hardware. No acute osseous abnormality identified.  IMPRESSION: Abnormal increased interstitial opacity diffusely. Top differential considerations include acute interstitial edema vs. acute viral/atypical pneumonia.   Electronically Signed   By: Lars Pinks M.D.   On: 02/05/2014 22:03    Medications:  Prior to Admission:  Prescriptions prior to admission  Medication Sig Dispense Refill  . ALPRAZolam (XANAX) 0.25 MG tablet Take 1 tablet (0.25 mg total) by  mouth 3 (three) times daily as needed for anxiety.  30 tablet  0  . aspirin 81 MG tablet Take 81 mg by mouth at bedtime.       . calcium-vitamin D (OSCAL WITH D) 500-200 MG-UNIT per tablet Take 1 tablet by mouth at  bedtime.       . clopidogrel (PLAVIX) 75 MG tablet Take 1 tablet (75 mg total) by mouth daily.  30 tablet  12  . fludrocortisone (FLORINEF) 0.1 MG tablet Take 2 tablets (0.2 mg total) by mouth daily.  30 tablet  12  . HYDROcodone-acetaminophen (NORCO/VICODIN) 5-325 MG per tablet Take 1 tablet by mouth every 4 (four) hours as needed for pain.  10 tablet  0  . levothyroxine (SYNTHROID, LEVOTHROID) 75 MCG tablet Take 75 mcg by mouth daily.        Marland Kitchen lisinopril (PRINIVIL,ZESTRIL) 10 MG tablet Take 1 tablet (10 mg total) by mouth daily.  30 tablet  12  . multivitamin-iron-minerals-folic acid (CENTRUM) chewable tablet Chew 1 tablet by mouth at bedtime.       . naproxen sodium (ANAPROX) 220 MG tablet Take 440 mg by mouth 2 (two) times daily as needed. For pain      . pravastatin (PRAVACHOL) 20 MG tablet Take 1 tablet (20 mg total) by mouth daily.  30 tablet  12  . VIVELLE-DOT 0.05 MG/24HR Place 1 patch onto the skin once a week.        Scheduled: . aspirin  81 mg Oral QHS  . calcium-vitamin D  1 tablet Oral QHS  . clopidogrel  75 mg Oral Daily  . estradiol  0.05 mg Transdermal Weekly  . fludrocortisone  0.2 mg Oral Daily  . heparin  5,000 Units Subcutaneous 3 times per day  . levothyroxine  75 mcg Oral QAC breakfast  . losartan  100 mg Oral Daily  . multivitamin with minerals  1 tablet Oral QHS  . nitroGLYCERIN  1 inch Topical Once  . pravastatin  20 mg Oral q1800  . sodium chloride  3 mL Intravenous Q12H   Continuous:  ZWC:HENIDP chloride, acetaminophen, ALPRAZolam, HYDROcodone-acetaminophen, naproxen, nitroGLYCERIN, ondansetron (ZOFRAN) IV, sodium chloride  Assesment: She was admitted with what appears to be some diastolic CHF. Her blood pressure was high which may be related to taking over-the-counter cough medications. She does have a history of hypertension which has been somewhat difficult to control particularly in the face of her orthostasis. She has been on lisinopril and some of her  fairly chronic cough may be related to that. She says she feels much better and wants to go home. Principal Problem:   Hypertensive urgency Active Problems:   HYPERTENSION, BENIGN   LBBB (left bundle branch block)   Acute on chronic diastolic CHF (congestive heart failure), NYHA class 1    Plan: I have requested cardiology consultation to see if she needs further workup    LOS: 1 day   Cherish Runde L 02/06/2014, 8:21 AM

## 2014-02-07 LAB — BASIC METABOLIC PANEL
ANION GAP: 12 (ref 5–15)
BUN: 19 mg/dL (ref 6–23)
CALCIUM: 9.3 mg/dL (ref 8.4–10.5)
CO2: 28 meq/L (ref 19–32)
CREATININE: 0.76 mg/dL (ref 0.50–1.10)
Chloride: 105 mEq/L (ref 96–112)
GFR calc Af Amer: >90 mL/min (ref >90)
GFR calc non Af Amer: 86 mL/min — ABNORMAL LOW (ref >90)
GLUCOSE: 97 mg/dL (ref 70–99)
Potassium: 3.5 mEq/L — ABNORMAL LOW (ref 3.7–5.3)
SODIUM: 145 meq/L (ref 137–147)

## 2014-02-07 MED ORDER — CARVEDILOL 6.25 MG PO TABS: 6.2500 mg | ORAL_TABLET | Freq: Two times a day (BID) | ORAL | Status: DC

## 2014-02-07 MED ORDER — LOSARTAN POTASSIUM 100 MG PO TABS
100.0000 mg | ORAL_TABLET | Freq: Every day | ORAL | Status: DC
Start: 2014-02-07 — End: 2022-07-28

## 2014-02-07 MED ORDER — SPIRONOLACTONE 25 MG PO TABS
12.5000 mg | ORAL_TABLET | Freq: Every day | ORAL | Status: DC
  Administered 2014-02-07: 12.5 mg via ORAL
  Filled 2014-02-07: qty 1

## 2014-02-07 MED ORDER — CARVEDILOL 3.125 MG PO TABS: 6.2500 mg | ORAL_TABLET | Freq: Two times a day (BID) | ORAL | Status: DC

## 2014-02-07 MED ORDER — CLOPIDOGREL BISULFATE 75 MG PO TABS: 75.0000 mg | ORAL_TABLET | Freq: Every day | ORAL | Status: AC

## 2014-02-07 MED ORDER — HYDROCHLOROTHIAZIDE 12.5 MG PO CAPS: 12.5000 mg | ORAL_CAPSULE | Freq: Every day | ORAL | Status: DC

## 2014-02-07 MED ORDER — SPIRONOLACTONE 12.5 MG HALF TABLET: 12.5000 mg | ORAL_TABLET | Freq: Every day | ORAL | Status: DC

## 2014-02-07 MED ORDER — POTASSIUM CHLORIDE CRYS ER 20 MEQ PO TBCR
20.0000 meq | EXTENDED_RELEASE_TABLET | Freq: Two times a day (BID) | ORAL | Status: DC
  Administered 2014-02-07: 20 meq via ORAL
  Filled 2014-02-07: qty 1

## 2014-02-07 MED ORDER — FLUDROCORTISONE ACETATE 0.1 MG PO TABS
0.1000 mg | ORAL_TABLET | Freq: Every day | ORAL | Status: DC
Start: 2014-02-07 — End: 2014-03-23

## 2014-02-07 NOTE — Progress Notes (Signed)
Subjective: She says she feels okay. Her echocardiogram yesterday showed that her ejection fraction has dropped to 45% since being approximately 60-65% 2 months ago. She was admitted with what seems to be congestive heart failure and is much improved on current treatment. She has had chronic neck pain but no definite ischemic heart pain.  Objective: Vital signs in last 24 hours: Temp:  [97.5 F (36.4 C)-98.5 F (36.9 C)] 98.5 F (36.9 C) (10/19 1937) Pulse Rate:  [70-78] 70 (10/20 0658) Resp:  [16-20] 20 (10/20 0658) BP: (170-180)/(65-78) 170/65 mmHg (10/20 0128) SpO2:  [97 %-99 %] 97 % (10/19 1937) Weight:  [68.357 kg (150 lb 11.2 oz)] 68.357 kg (150 lb 11.2 oz) (10/20 0706) Weight change:  Last BM Date: 02/06/14  Intake/Output from previous day: 10/19 0701 - 10/20 0700 In: 720 [P.O.:720] Out: -   PHYSICAL EXAM General appearance: alert, cooperative and no distress Resp: clear to auscultation bilaterally Cardio: regular rate and rhythm, S1, S2 normal, no murmur, click, rub or gallop GI: soft, non-tender; bowel sounds normal; no masses,  no organomegaly Extremities: extremities normal, atraumatic, no cyanosis or edema  Lab Results:  Results for orders placed during the hospital encounter of 02/05/14 (from the past 48 hour(s))  TROPONIN I     Status: None   Collection Time    02/05/14 10:35 PM      Result Value Ref Range   Troponin I <0.30  <0.30 ng/mL   Comment:            Due to the release kinetics of cTnI,     a negative result within the first hours     of the onset of symptoms does not rule out     myocardial infarction with certainty.     If myocardial infarction is still suspected,     repeat the test at appropriate intervals.  BASIC METABOLIC PANEL     Status: Abnormal   Collection Time    02/05/14 10:35 PM      Result Value Ref Range   Sodium 146  137 - 147 mEq/L   Potassium 3.7  3.7 - 5.3 mEq/L   Chloride 107  96 - 112 mEq/L   CO2 29  19 - 32 mEq/L   Glucose, Bld 100 (*) 70 - 99 mg/dL   BUN 18  6 - 23 mg/dL   Creatinine, Ser 0.78  0.50 - 1.10 mg/dL   Calcium 9.5  8.4 - 10.5 mg/dL   GFR calc non Af Amer 85 (*) >90 mL/min   GFR calc Af Amer >90  >90 mL/min   Comment: (NOTE)     The eGFR has been calculated using the CKD EPI equation.     This calculation has not been validated in all clinical situations.     eGFR's persistently <90 mL/min signify possible Chronic Kidney     Disease.   Anion gap 10  5 - 15  CBC WITH DIFFERENTIAL     Status: None   Collection Time    02/05/14 10:35 PM      Result Value Ref Range   WBC 5.9  4.0 - 10.5 K/uL   RBC 4.22  3.87 - 5.11 MIL/uL   Hemoglobin 13.1  12.0 - 15.0 g/dL   HCT 38.0  36.0 - 46.0 %   MCV 90.0  78.0 - 100.0 fL   MCH 31.0  26.0 - 34.0 pg   MCHC 34.5  30.0 - 36.0 g/dL   RDW 13.3  11.5 - 15.5 %   Platelets 222  150 - 400 K/uL   Neutrophils Relative % 56  43 - 77 %   Neutro Abs 3.3  1.7 - 7.7 K/uL   Lymphocytes Relative 33  12 - 46 %   Lymphs Abs 1.9  0.7 - 4.0 K/uL   Monocytes Relative 6  3 - 12 %   Monocytes Absolute 0.3  0.1 - 1.0 K/uL   Eosinophils Relative 4  0 - 5 %   Eosinophils Absolute 0.2  0.0 - 0.7 K/uL   Basophils Relative 1  0 - 1 %   Basophils Absolute 0.1  0.0 - 0.1 K/uL  PRO B NATRIURETIC PEPTIDE     Status: Abnormal   Collection Time    02/05/14 10:35 PM      Result Value Ref Range   Pro B Natriuretic peptide (BNP) 2140.0 (*) 0 - 125 pg/mL  TSH     Status: Abnormal   Collection Time    02/05/14 10:35 PM      Result Value Ref Range   TSH 6.000 (*) 0.350 - 4.500 uIU/mL   Comment: Performed at [facility]  TROPONIN I     Status: None   Collection Time    02/06/14 12:27 AM      Result Value Ref Range   Troponin I <0.30  <0.30 ng/mL   Comment:            Due to the release kinetics of cTnI,     a negative result within the first hours     of the onset of symptoms does not rule out     myocardial infarction with certainty.     If myocardial  infarction is still suspected,     repeat the test at appropriate intervals.  LIPID PANEL     Status: None   Collection Time    02/06/14  6:16 AM      Result Value Ref Range   Cholesterol 140  0 - 200 mg/dL   Triglycerides 100  <150 mg/dL   HDL 66  >39 mg/dL   Total CHOL/HDL Ratio 2.1     VLDL 20  0 - 40 mg/dL   LDL Cholesterol 54  0 - 99 mg/dL   Comment:            Total Cholesterol/HDL:CHD Risk     Coronary Heart Disease Risk Table                         Men   Women      1/2 Average Risk   3.4   3.3      Average Risk       5.0   4.4      2 X Average Risk   9.6   7.1      3 X Average Risk  23.4   11.0                Use the calculated Patient Ratio     above and the CHD Risk Table     to determine the patient's CHD Risk.                ATP III CLASSIFICATION (LDL):      <100     mg/dL   Optimal      100-129  mg/dL   Near or Above  Optimal      130-159  mg/dL   Borderline      160-189  mg/dL   High      >190     mg/dL   Very High  TROPONIN I     Status: None   Collection Time    02/06/14  6:17 AM      Result Value Ref Range   Troponin I <0.30  <0.30 ng/mL   Comment:            Due to the release kinetics of cTnI,     a negative result within the first hours     of the onset of symptoms does not rule out     myocardial infarction with certainty.     If myocardial infarction is still suspected,     repeat the test at appropriate intervals.  TROPONIN I     Status: None   Collection Time    02/06/14 11:57 AM      Result Value Ref Range   Troponin I <0.30  <0.30 ng/mL   Comment:            Due to the release kinetics of cTnI,     a negative result within the first hours     of the onset of symptoms does not rule out     myocardial infarction with certainty.     If myocardial infarction is still suspected,     repeat the test at appropriate intervals.  BASIC METABOLIC PANEL     Status: Abnormal   Collection Time    02/07/14  5:58 AM       Result Value Ref Range   Sodium 145  137 - 147 mEq/L   Potassium 3.5 (*) 3.7 - 5.3 mEq/L   Chloride 105  96 - 112 mEq/L   CO2 28  19 - 32 mEq/L   Glucose, Bld 97  70 - 99 mg/dL   BUN 19  6 - 23 mg/dL   Creatinine, Ser 0.76  0.50 - 1.10 mg/dL   Calcium 9.3  8.4 - 10.5 mg/dL   GFR calc non Af Amer 86 (*) >90 mL/min   GFR calc Af Amer >90  >90 mL/min   Comment: (NOTE)     The eGFR has been calculated using the CKD EPI equation.     This calculation has not been validated in all clinical situations.     eGFR's persistently <90 mL/min signify possible Chronic Kidney     Disease.   Anion gap 12  5 - 15    ABGS No results found for this basename: PHART, PCO2, PO2ART, TCO2, HCO3,  in the last 72 hours CULTURES No results found for this or any previous visit (from the past 240 hour(s)). Studies/Results: Dg Chest 2 View  02/05/2014   CLINICAL DATA:  68 year old female with acute shortness of breath wheezing and nonproductive cough. Associated chest pain. Diagnosed with bronchitis 2 weeks ago. Initial encounter.  EXAM: CHEST  2 VIEW  COMPARISON:  12/08/2013 and earlier.  FINDINGS: Diffusely increased interstitial markings. Basilar predominance. Also linear markings along the periphery of both lungs suggesting Kerley B-lines. Normal cardiac size and mediastinal contours. Visualized tracheal air column is within normal limits. No pneumothorax. No definite pleural effusion. Partially visible anterior and posterior lower cervical spine hardware. No acute osseous abnormality identified.  IMPRESSION: Abnormal increased interstitial opacity diffusely. Top differential considerations include acute interstitial edema vs. acute viral/atypical pneumonia.   Electronically Signed  By: Lars Pinks M.D.   On: 02/05/2014 22:03    Medications:  Prior to Admission:  Prescriptions prior to admission  Medication Sig Dispense Refill  . ALPRAZolam (XANAX) 0.25 MG tablet Take 1 tablet (0.25 mg total) by mouth 3  (three) times daily as needed for anxiety.  30 tablet  0  . calcium-vitamin D (OSCAL WITH D) 500-200 MG-UNIT per tablet Take 1 tablet by mouth at bedtime.       . carboxymethylcellulose (REFRESH PLUS) 0.5 % SOLN Place 1 drop into the right eye daily as needed (dry eye).      Marland Kitchen clopidogrel (PLAVIX) 75 MG tablet Take 1 tablet (75 mg total) by mouth daily.  30 tablet  12  . fludrocortisone (FLORINEF) 0.1 MG tablet Take 2 tablets (0.2 mg total) by mouth daily.  30 tablet  12  . levothyroxine (SYNTHROID, LEVOTHROID) 75 MCG tablet Take 75 mcg by mouth daily.        Marland Kitchen lisinopril (PRINIVIL,ZESTRIL) 10 MG tablet Take 1 tablet (10 mg total) by mouth daily.  30 tablet  12  . multivitamin-iron-minerals-folic acid (CENTRUM) chewable tablet Chew 1 tablet by mouth at bedtime.       . naproxen sodium (ANAPROX) 220 MG tablet Take 440 mg by mouth daily as needed (pain). For pain      . pravastatin (PRAVACHOL) 20 MG tablet Take 1 tablet (20 mg total) by mouth daily.  30 tablet  12  . VIVELLE-DOT 0.05 MG/24HR Place 1 patch onto the skin 2 (two) times a week. Monday and Wednesday.      Marland Kitchen aspirin 81 MG tablet Take 81 mg by mouth at bedtime.       Marland Kitchen HYDROcodone-acetaminophen (NORCO/VICODIN) 5-325 MG per tablet Take 1 tablet by mouth every 4 (four) hours as needed for pain.  10 tablet  0   Scheduled: . aspirin  81 mg Oral QHS  . calcium-vitamin D  1 tablet Oral QHS  . carvedilol  3.125 mg Oral BID WC  . clopidogrel  75 mg Oral Daily  . estradiol  0.05 mg Transdermal Weekly  . fludrocortisone  0.1 mg Oral Daily  . heparin  5,000 Units Subcutaneous 3 times per day  . levothyroxine  75 mcg Oral QAC breakfast  . losartan  100 mg Oral Daily  . multivitamin with minerals  1 tablet Oral QHS  . nitroGLYCERIN  1 inch Topical Once  . pravastatin  20 mg Oral q1800  . sodium chloride  3 mL Intravenous Q12H   Continuous:  OEH:OZYYQM chloride, acetaminophen, ALPRAZolam, HYDROcodone-acetaminophen, naproxen, nitroGLYCERIN,  ondansetron (ZOFRAN) IV, sodium chloride  Assesment: She was admitted with acute on chronic combined systolic and diastolic heart failure now with ejection fraction down around 45%. She also has some diastolic dysfunction seen on her echo and has an abnormality that may be hypokinesis but could be related to her known left bundle branch block. I discussed all this with her her sister and her husband. Principal Problem:   Hypertensive urgency Active Problems:   HYPERTENSION, BENIGN   LBBB (left bundle branch block)   Acute on chronic diastolic CHF (congestive heart failure), NYHA class 1    Plan: She has symptomatically improved. She may need ischemic workup. I will discuss with cardiology    LOS: 2 days   Kailin Principato L 02/07/2014, 8:26 AM

## 2014-02-07 NOTE — Progress Notes (Signed)
Consulting cardiologist: Rozann Lesches MD Primary Cardiologist: Rozann Lesches MD  Subjective:    Breathing and feeling much better. Wants to go home. Having mild epistaxis.    Objective:   Temp:  [97.5 F (36.4 C)-98.5 F (36.9 C)] 98.5 F (36.9 C) (10/19 1937) Pulse Rate:  [70-78] 70 (10/20 0658) Resp:  [16-20] 20 (10/20 0658) BP: (170-180)/(65-78) 170/65 mmHg (10/20 0128) SpO2:  [97 %-99 %] 97 % (10/19 1937) Weight:  [150 lb 11.2 oz (68.357 kg)] 150 lb 11.2 oz (68.357 kg) (10/20 0706) Last BM Date: 02/06/14  Filed Weights   02/06/14 0137 02/06/14 0630 02/07/14 0706  Weight: 153 lb (69.4 kg) 152 lb 6.4 oz (69.128 kg) 150 lb 11.2 oz (68.357 kg)    Intake/Output Summary (Last 24 hours) at 02/07/14 1054 Last data filed at 02/07/14 0900  Gross per 24 hour  Intake    720 ml  Output      0 ml  Net    720 ml    Telemetry: NSR with PAC's.  Exam:  General: No acute distress.  Lungs: Clear to auscultation, nonlabored.  Cardiac: No elevated JVP or bruits. RRR, no gallop or rub.   Abdomen: Normoactive bowel sounds, nontender, nondistended.  Extremities: No pitting edema, distal pulses full.   Echocardiogram: Left ventricle: The cavity size was normal. Wall thickness was increased in a pattern of mild LVH. Systolic function was mildly reduced. The estimated ejection fraction was 45%. Cannot exclude hypokinesis of the anteroseptal myocardium. although possibly related to LBBB. Doppler parameters are consistent with abnormal left ventricular relaxation (grade 1 diastolic dysfunction). Doppler parameters are consistent with high ventricular filling pressure. - Ventricular septum: Septal motion showed abnormal function and dyssynergy. - Aortic valve: Mildly calcified annulus. Trileaflet. There was no significant regurgitation. - Mitral valve: Calcified annulus. There was moderate regurgitation. - Right atrium: Central venous pressure (est): 8 mm Hg. - Atrial  septum: No defect or patent foramen ovale was identified. - Tricuspid valve: There was trivial regurgitation. - Pulmonary arteries: Systolic pressure was mildly increased. PA peak pressure: 43 mm Hg (S). - Pericardium, extracardiac: There was no pericardial effusion.  Impressions:  Mild LVH with LVEF approximately 45%. There is septal dyssynergy, difficult to exclude anteroseptal hypokinesis, although may be related to left bundle branch block. Grade 1 diastolic dysfunction with increased filling pressures. Moderate mitral regurgitation. Trivial tricuspid regurgitation with evidence of mildly increased PASP 43 mm mercury.   Lab Results:  Basic Metabolic Panel:  Recent Labs Lab 02/05/14 2235 02/07/14 0558  NA 146 145  K 3.7 3.5*  CL 107 105  CO2 29 28  GLUCOSE 100* 97  BUN 18 19  CREATININE 0.78 0.76  CALCIUM 9.5 9.3     CBC:  Recent Labs Lab 02/05/14 2235  WBC 5.9  HGB 13.1  HCT 38.0  MCV 90.0  PLT 222    Cardiac Enzymes:  Recent Labs Lab 02/06/14 0027 02/06/14 0617 02/06/14 1157  TROPONINI <0.30 <0.30 <0.30    BNP:  Recent Labs  02/05/14 2235  PROBNP 2140.0*     Medications:   Scheduled Medications: . aspirin  81 mg Oral QHS  . calcium-vitamin D  1 tablet Oral QHS  . carvedilol  3.125 mg Oral BID WC  . clopidogrel  75 mg Oral Daily  . estradiol  0.05 mg Transdermal Weekly  . fludrocortisone  0.1 mg Oral Daily  . heparin  5,000 Units Subcutaneous 3 times per day  . levothyroxine  75 mcg  Oral QAC breakfast  . losartan  100 mg Oral Daily  . multivitamin with minerals  1 tablet Oral QHS  . nitroGLYCERIN  1 inch Topical Once  . potassium chloride  20 mEq Oral BID  . pravastatin  20 mg Oral q1800  . sodium chloride  3 mL Intravenous Q12H   PRN Medications: sodium chloride, acetaminophen, ALPRAZolam, HYDROcodone-acetaminophen, naproxen, nitroGLYCERIN, ondansetron (ZOFRAN) IV, sodium chloride   Assessment and Plan:   1. Mild systolic  dysfunction with acute systolic CHF: She has diuresed and feels better. Wt is down 4 lbs from admission. Decreased EF new compared to previous study. Will need to consider ischemic w/u either IP or OP. Now on carvedilol 3.25 mg BID. HR in the 70;s. Creatinine 0.76.  2. Hypertension: Currently still elevated, despite losartan 100 mg daily, carvedilol added. We have decreased florinef to 0.1 mg daily. Did have orthostatic blood pressure drop from 180-140 this morning.  3. Hx of TIA: Continue ASA and Plavix  4. Hypothyroidism: TSH is elevated 6.000 up from 4.150 in August. She will need to have titration of medications. Defer to PCP. Risk factor in CAD.   5. Hypercholesterolemia:   LDL 54, TC 140. Continue statin.   Phill Myron. Lawrence NP  02/07/2014, 10:54 AM   Attending note:   Patient seen in examined. Modified above note by Ms. Lawrence NP. Ms. Gemme feels much better today. She would like to go home. I met with the patient and her 3 sisters in the room. I updated them on the echocardiogram findings with mild reduction in LVEF compared to the prior study. Still suspect that this is nonischemic based on previous workup. She has in fact had stress testing in the past that was abnormal, followed by findings of normal coronary angiography, so I do not think that a stress test is indicated at this time. Would recommend discharge home on losartan 100 mg daily, Coreg at 6.25 mg daily, and Aldactone 12.5 mg daily. We have concurrently reduced Florinef to 0.1 mg daily. No potassium supplement. She will need to followup with [place] in the next one to 2 weeks and have a BMET around that time.  Satira Sark, M.D., F.A.C.C.

## 2014-02-07 NOTE — Progress Notes (Signed)
Patient states understanding of discharge instructions,prescription given. Patient verbalizes  She will watch her salt intake, weight and call the MD when needed.

## 2014-02-08 NOTE — Discharge Summary (Signed)
Physician Discharge Summary  Patient ID: [name] MRN: [id number] DOB/AGE: 1946/01/04 68 y.o. Primary Care Physician:Laura Dittman L, MD Admit date: 02/05/2014 Discharge date: 02/08/2014    Discharge Diagnoses:   Principal Problem:   Hypertensive urgency Active Problems:   HYPERTENSION, BENIGN   LBBB (left bundle branch block)   Acute on chronic diastolic CHF (congestive heart failure), NYHA class 1     Medication List    STOP taking these medications       lisinopril 10 MG tablet  Commonly known as:  PRINIVIL,ZESTRIL      TAKE these medications       ALPRAZolam 0.25 MG tablet  Commonly known as:  XANAX  Take 1 tablet (0.25 mg total) by mouth 3 (three) times daily as needed for anxiety.     aspirin 81 MG tablet  Take 81 mg by mouth at bedtime.     calcium-vitamin D 500-200 MG-UNIT per tablet  Commonly known as:  OSCAL WITH D  Take 1 tablet by mouth at bedtime.     carboxymethylcellulose 0.5 % Soln  Commonly known as:  REFRESH PLUS  Place 1 drop into the right eye daily as needed (dry eye).     carvedilol 6.25 MG tablet  Commonly known as:  COREG  Take 1 tablet (6.25 mg total) by mouth 2 (two) times daily with a meal.     clopidogrel 75 MG tablet  Commonly known as:  PLAVIX  Take 1 tablet (75 mg total) by mouth daily.     fludrocortisone 0.1 MG tablet  Commonly known as:  FLORINEF  Take 1 tablet (0.1 mg total) by mouth daily.     HYDROcodone-acetaminophen 5-325 MG per tablet  Commonly known as:  NORCO/VICODIN  Take 1 tablet by mouth every 4 (four) hours as needed for pain.     levothyroxine 75 MCG tablet  Commonly known as:  SYNTHROID, LEVOTHROID  Take 75 mcg by mouth daily.     losartan 100 MG tablet  Commonly known as:  COZAAR  Take 1 tablet (100 mg total) by mouth daily.     multivitamin-iron-minerals-folic acid chewable tablet  Chew 1 tablet by mouth at bedtime.     naproxen sodium 220 MG tablet  Commonly known as:  ANAPROX  Take  440 mg by mouth daily as needed (pain). For pain     pravastatin 20 MG tablet  Commonly known as:  PRAVACHOL  Take 1 tablet (20 mg total) by mouth daily.     spironolactone 12.5 mg Tabs tablet  Commonly known as:  ALDACTONE  Take 0.5 tablets (12.5 mg total) by mouth daily.     VIVELLE-DOT 0.05 MG/24HR patch  Generic drug:  estradiol  Place 1 patch onto the skin 2 (two) times a week. Monday and Wednesday.        Discharged Condition:improved    Consults:cardiology  Significant Diagnostic Studies: Dg Chest 2 View  02/05/2014   CLINICAL DATA:  68 year old female with acute shortness of breath wheezing and nonproductive cough. Associated chest pain. Diagnosed with bronchitis 2 weeks ago. Initial encounter.  EXAM: CHEST  2 VIEW  COMPARISON:  12/08/2013 and earlier.  FINDINGS: Diffusely increased interstitial markings. Basilar predominance. Also linear markings along the periphery of both lungs suggesting Kerley B-lines. Normal cardiac size and mediastinal contours. Visualized tracheal air column is within normal limits. No pneumothorax. No definite pleural effusion. Partially visible anterior and posterior lower cervical spine hardware. No acute osseous abnormality identified.  IMPRESSION: Abnormal increased interstitial  opacity diffusely. Top differential considerations include acute interstitial edema vs. acute viral/atypical pneumonia.   Electronically Signed   By: Laura GambleLee  [name] M.D.   On: 02/05/2014 22:03    Lab Results: Basic Metabolic Panel:  Recent Labs  40/98/1110/18/15 2235 02/07/14 0558  NA 146 145  K 3.7 3.5*  CL 107 105  CO2 29 28  GLUCOSE 100* 97  BUN 18 19  CREATININE 0.78 0.76  CALCIUM 9.5 9.3   Liver Function Tests: No results found for this basename: AST, ALT, ALKPHOS, BILITOT, PROT, ALBUMIN,  in the last 72 hours   CBC:  Recent Labs  02/05/14 2235  WBC 5.9  NEUTROABS 3.3  HGB 13.1  HCT 38.0  MCV 90.0  PLT 222    No results found for this or any previous  visit (from the past 240 hour(s)).   Hospital Course: This is a 68 year old who came to the emergency department because of shortness of breath. Chest x-ray was suggestive of congestive heart failure and her BNP was elevated. She has been having cough for the last 3-4 weeks was treated for bronchitis and seemed to improve and then developed more problems. She has been having difficulty with high blood pressure and with orthostatic hypotension but both of those have been better recently. She has not had any swelling of her legs orthopnea or PND. She was given Lasix and improved. Cardiology consult was obtained. She had echocardiogram that showed that her cardiac ejection fraction had dropped approximately 15-20 points in 2 months. She has not had any chest pain. It's unclear why she had reduction in her cardiac ejection fraction. Her medications were modified it was felt that she did not need ischemic workup at this time and she was ready for discharge  Discharge Exam: Blood pressure 170/65, pulse 70, temperature 98.5 F (36.9 C), temperature source Oral, resp. rate 20, height 5\' 4"  (1.626 m), weight 68.357 kg (150 lb 11.2 oz), SpO2 97.00%. She is awake and alert. Chest is clear. Heart is regular. Abdomen is soft.  Disposition: Home she will followup with cardiology on 1027 and in my office in November        Follow-up Information   Follow up with Laura ReiningKathryn Lawrence, NP On 02/14/2014. (at 1:10 pm)    Specialty:  Nurse Practitioner   Contact information:   [address] [place] [id number] [phone]       Signed: Kenasia Scheller [name]   02/08/2014, 8:46 AM

## 2014-02-13 NOTE — Progress Notes (Signed)
HPI: [name] is a 68 year old patient of [name], that we are following post hospitalization after admission for syncopal episode, history of hyperlipidemia, TIA, orthostatic hypotension, left bundle branch block, and hypothyroidism. The patient had a full workup, which involved CT scan and MRI of the spine and head all of which revealed no acute abnormalities. She also had bilateral carotid ultrasound, which was negative for greater than 50% stenosis of the internal carotid artery. TED hose were placed. She was not found to have a cardiac etiology for her symptoms.  She unfortunately was readmitted approximately one month later, with recurrent, hypertension, and acute on chronic diastolic heart failure. The patient was diuresis. She had an echocardiogram, which revealed an EF Systolic function was mildly reduced. The estimated ejection fraction was 45%. Cannot exclude hypokinesis of the anteroseptal myocardium. although possibly related to LBBB.   Lisinopril was discontinued, she was started on losartan 100 mg daily, do to cough. She was continued on carvedilol 6.25 mg twice a day,Plavix,, aspirin, spironolactone, and pravastatin.  She comes today hypertensive. She is only taking the coreg once a day instead of twice a day as directed as she thought she was told to do so. She states her BP has been up at night. She is also taking Alleve twice a day for arthritis pain of her neck. She denies dizziness or recurrent coughing on ARB.   Allergies  Allergen Reactions  . Codeine Nausea And Vomiting  . Lisinopril Cough  . Other Nausea And Vomiting    Most anesthesia   . Latex Rash    Current Outpatient Prescriptions  Medication Sig Dispense Refill  . ALPRAZolam (XANAX) 0.25 MG tablet Take 1 tablet (0.25 mg total) by mouth 3 (three) times daily as needed for anxiety.  30 tablet  0  . aspirin 81 MG tablet Take 81 mg by mouth at bedtime.       . calcium-vitamin D (OSCAL WITH D) 500-200  MG-UNIT per tablet Take 1 tablet by mouth at bedtime.       . carboxymethylcellulose (REFRESH PLUS) 0.5 % SOLN Place 1 drop into the right eye daily as needed (dry eye).      . carvedilol (COREG) 6.25 MG tablet Take 6.25 mg by mouth daily.      . clopidogrel (PLAVIX) 75 MG tablet Take 1 tablet (75 mg total) by mouth daily.  30 tablet  12  . fludrocortisone (FLORINEF) 0.1 MG tablet Take 1 tablet (0.1 mg total) by mouth daily.  30 tablet  12  . levothyroxine (SYNTHROID, LEVOTHROID) 75 MCG tablet Take 75 mcg by mouth daily.        . multivitamin-iron-minerals-folic acid (CENTRUM) chewable tablet Chew 1 tablet by mouth at bedtime.       . pravastatin (PRAVACHOL) 20 MG tablet Take 1 tablet (20 mg total) by mouth daily.  30 tablet  12  . spironolactone (ALDACTONE) 12.5 mg TABS tablet Take 0.5 tablets (12.5 mg total) by mouth daily.  15 tablet  12  . losartan (COZAAR) 100 MG tablet Take 1 tablet (100 mg total) by mouth daily.  30 tablet  12   No current facility-administered medications for this visit.    Past Medical History  Diagnosis Date  . Essential hypertension   . Hyperlipidemia   . Hypothyroidism   . GERD (gastroesophageal reflux disease)   . Orthostatic hypotension   . Arthritis   . Current use of estrogen therapy 03/25/2013  . LBBB (left bundle branch block)   .  H/O cardiac catheterization     2011: normal coronary arteries (false positive stress test).  Marland Kitchen. TIA (transient ischemic attack)     On Plavix    Past Surgical History  Procedure Laterality Date  . Partial hysterectomy    . Neck surgery      Secondary to ruptured disc  . Colonoscopy  12/25/2010    Procedure: COLONOSCOPY;  Surgeon: [name];  Location: [facility];  Service: Endoscopy;  Laterality: N/A;  . Abdominal hysterectomy    . Wrist fusion      Right- torn ligament  . Posterior cervical fusion/foraminotomy  12/18/2011    Procedure: POSTERIOR CERVICAL FUSION/FORAMINOTOMY LEVEL 2;  Surgeon: Laura Shortsobert W  Nudelman, [name];  Location: [facility];  Service: Neurosurgery;  Laterality: Bilateral;  Cervical four to six Posterior cervical arthrodesis    ROS: Review of systems complete and found to be negative unless listed above  PHYSICAL EXAM BP 186/80  Pulse 62  Ht 5\' 4"  (1.626 m)  Wt 152 lb (68.947 kg)  BMI 26.08 kg/m2 General: Well developed, well nourished, in no acute distress Head: Eyes PERRLA, No xanthomas.   Normal cephalic and atraumatic Lungs: Clear bilaterally to auscultation and percussion. Heart: HRRR S1 S2, without MRG.  Pulses are 2+ & equal.            No carotid bruit. No JVD.  No abdominal bruits. No femoral bruits. Abdomen: Bowel sounds are positive, abdomen soft and non-tender without masses or                  Hernia's noted. Msk:  Back normal, normal gait. Normal strength and tone for age. Extremities: No clubbing, cyanosis or edema.  DP +1 Neuro: Alert and oriented X 3. Psych:  Good affect, responds appropriately    ASSESSMENT AND PLAN

## 2014-02-14 ENCOUNTER — Ambulatory Visit (HOSPITAL_COMMUNITY)
Admission: RE | Admit: 2014-02-14 | Discharge: 2014-02-14 | Disposition: A | Discharge status: Home / Self Care | Payer: BC Managed Care – PPO | Source: Ambulatory Visit | Attending: Adult Health | Admitting: Adult Health

## 2014-02-14 ENCOUNTER — Encounter: Payer: Self-pay | Admitting: Adult Health

## 2014-02-14 ENCOUNTER — Ambulatory Visit (INDEPENDENT_AMBULATORY_CARE_PROVIDER_SITE_OTHER): Payer: BC Managed Care – PPO | Admitting: Adult Health

## 2014-02-14 VITALS — BP 186/80 | HR 62 | Ht 64.0 in | Wt 152.0 lb

## 2014-02-14 DIAGNOSIS — R944 Abnormal results of kidney function studies: Secondary | ICD-10-CM

## 2014-02-14 DIAGNOSIS — I5033 Acute on chronic diastolic (congestive) heart failure: Secondary | ICD-10-CM

## 2014-02-14 DIAGNOSIS — R55 Syncope and collapse: Secondary | ICD-10-CM

## 2014-02-14 DIAGNOSIS — I951 Orthostatic hypotension: Secondary | ICD-10-CM | POA: Diagnosis not present

## 2014-02-14 DIAGNOSIS — I10 Essential (primary) hypertension: Secondary | ICD-10-CM | POA: Insufficient documentation

## 2014-02-14 MED ORDER — CARVEDILOL 6.25 MG PO TABS: 6.2500 mg | ORAL_TABLET | Freq: Two times a day (BID) | ORAL | Status: DC

## 2014-02-14 NOTE — Progress Notes (Deleted)
Name: [name]    DOB: [birth year]  Age: 68 y.o.  MR#: [id number]       PCP:  Fredirick MaudlinHAWKINS,EDWARD L, MD      Insurance: Payor: BLUE CROSS BLUE SHIELD / Plan: BCBS PPO OUT OF STATE / Product Type: *No Product type* /   CC:    Chief Complaint  Patient presents with  . Loss of Consciousness  . Hyperlipidemia    VS Filed Vitals:   02/14/14 1304  BP: 186/80  Pulse: 62  Height: 5\' 4"  (1.626 m)  Weight: 152 lb (68.947 kg)    Weights Current Weight  02/14/14 152 lb (68.947 kg)  02/07/14 150 lb 11.2 oz (68.357 kg)  12/08/13 149 lb 14.6 oz (68 kg)    Blood Pressure  BP Readings from Last 3 Encounters:  02/14/14 186/80  02/07/14 170/65  12/13/13 157/52     Admit date:  (Not on file) Last encounter with RMR:  Visit date not found   Allergy Codeine; Lisinopril; Other; and Latex  Current Outpatient Prescriptions  Medication Sig Dispense Refill  . ALPRAZolam (XANAX) 0.25 MG tablet Take 1 tablet (0.25 mg total) by mouth 3 (three) times daily as needed for anxiety.  30 tablet  0  . aspirin 81 MG tablet Take 81 mg by mouth at bedtime.       . calcium-vitamin D (OSCAL WITH D) 500-200 MG-UNIT per tablet Take 1 tablet by mouth at bedtime.       . carboxymethylcellulose (REFRESH PLUS) 0.5 % SOLN Place 1 drop into the right eye daily as needed (dry eye).      . carvedilol (COREG) 6.25 MG tablet Take 6.25 mg by mouth daily.      . clopidogrel (PLAVIX) 75 MG tablet Take 1 tablet (75 mg total) by mouth daily.  30 tablet  12  . fludrocortisone (FLORINEF) 0.1 MG tablet Take 1 tablet (0.1 mg total) by mouth daily.  30 tablet  12  . levothyroxine (SYNTHROID, LEVOTHROID) 75 MCG tablet Take 75 mcg by mouth daily.        . multivitamin-iron-minerals-folic acid (CENTRUM) chewable tablet Chew 1 tablet by mouth at bedtime.       . pravastatin (PRAVACHOL) 20 MG tablet Take 1 tablet (20 mg total) by mouth daily.  30 tablet  12  . spironolactone (ALDACTONE) 12.5 mg TABS tablet Take 0.5 tablets (12.5 mg  total) by mouth daily.  15 tablet  12  . losartan (COZAAR) 100 MG tablet Take 1 tablet (100 mg total) by mouth daily.  30 tablet  12   No current facility-administered medications for this visit.    Discontinued Meds:    Medications Discontinued During This Encounter  Medication Reason  . HYDROcodone-acetaminophen (NORCO/VICODIN) 5-325 MG per tablet Error  . naproxen sodium (ANAPROX) 220 MG tablet Error  . VIVELLE-DOT 0.05 MG/24HR Error  . carvedilol (COREG) 6.25 MG tablet     Patient Active Problem List   Diagnosis Date Noted  . Acute on chronic diastolic CHF (congestive heart failure), NYHA class 1 02/06/2014  . Hypertensive urgency 02/06/2014  . LBBB (left bundle branch block) 12/09/2013  . Hypothyroidism 12/09/2013  . Syncope 12/08/2013  . Orthostatic hypotension 12/07/2013  . TIA (transient ischemic attack) 12/05/2013  . Current use of estrogen therapy 03/25/2013  . Nonspecific abnormal electrocardiogram (ECG) (EKG) 01/03/2011  . HYPERLIPIDEMIA-MIXED 05/08/2010  . HYPERTENSION, BENIGN 05/08/2010  . OSTEOARTHROSIS UNSPEC WHETHER GEN/LOCALIZED HAND 01/24/2010    LABS    Component Value Date/Time  NA 145 02/07/2014 0558   NA 146 02/05/2014 2235   NA 141 12/08/2013 0917   K 3.5* 02/07/2014 0558   K 3.7 02/05/2014 2235   K 4.3 12/08/2013 0917   CL 105 02/07/2014 0558   CL 107 02/05/2014 2235   CL 104 12/08/2013 0917   CO2 28 02/07/2014 0558   CO2 29 02/05/2014 2235   CO2 25 12/08/2013 0917   GLUCOSE 97 02/07/2014 0558   GLUCOSE 100* 02/05/2014 2235   GLUCOSE 105* 12/08/2013 0917   BUN 19 02/07/2014 0558   BUN 18 02/05/2014 2235   BUN 16 12/08/2013 0917   CREATININE 0.76 02/07/2014 0558   CREATININE 0.78 02/05/2014 2235   CREATININE 0.87 12/08/2013 0917   CALCIUM 9.3 02/07/2014 0558   CALCIUM 9.5 02/05/2014 2235   CALCIUM 9.6 12/08/2013 0917   GFRNONAA 86* 02/07/2014 0558   GFRNONAA 85* 02/05/2014 2235   GFRNONAA 68* 12/08/2013 0917   GFRAA >90 02/07/2014 0558    GFRAA >90 02/05/2014 2235   GFRAA 79* 12/08/2013 0917   CMP     Component Value Date/Time   NA 145 02/07/2014 0558   K 3.5* 02/07/2014 0558   CL 105 02/07/2014 0558   CO2 28 02/07/2014 0558   GLUCOSE 97 02/07/2014 0558   BUN 19 02/07/2014 0558   CREATININE 0.76 02/07/2014 0558   CALCIUM 9.3 02/07/2014 0558   PROT 7.4 12/08/2013 0917   ALBUMIN 4.2 12/08/2013 0917   AST 23 12/08/2013 0917   ALT 15 12/08/2013 0917   ALKPHOS 93 12/08/2013 0917   BILITOT 0.5 12/08/2013 0917   GFRNONAA 86* 02/07/2014 0558   GFRAA >90 02/07/2014 0558       Component Value Date/Time   WBC 5.9 02/05/2014 2235   WBC 4.9 12/08/2013 0917   WBC 6.0 12/05/2013 1813   HGB 13.1 02/05/2014 2235   HGB 14.1 12/08/2013 0917   HGB 13.5 12/05/2013 1813   HCT 38.0 02/05/2014 2235   HCT 39.9 12/08/2013 0917   HCT 38.6 12/05/2013 1813   MCV 90.0 02/05/2014 2235   MCV 87.9 12/08/2013 0917   MCV 89.1 12/05/2013 1813    Lipid Panel     Component Value Date/Time   CHOL 140 02/06/2014 0616   TRIG 100 02/06/2014 0616   HDL 66 02/06/2014 0616   CHOLHDL 2.1 02/06/2014 0616   VLDL 20 02/06/2014 0616   LDLCALC 54 02/06/2014 0616    ABG No results found for this basename: phart, pco2, pco2art, po2, po2art, hco3, tco2, acidbasedef, o2sat     Lab Results  Component Value Date   TSH 6.000* 02/05/2014   BNP (last 3 results)  Recent Labs  02/05/14 2235  PROBNP 2140.0*   Cardiac Panel (last 3 results) No results found for this basename: CKTOTAL, CKMB, TROPONINI, RELINDX,  in the last 72 hours  Iron/TIBC/Ferritin/ %Sat No results found for this basename: iron, tibc, ferritin, ironpctsat     EKG Orders placed during the hospital encounter of 02/05/14  . EKG 12-LEAD  . EKG 12-LEAD  . EKG 12-LEAD  . EKG 12-LEAD     Prior Assessment and Plan Problem List as of 02/14/2014     Cardiovascular and Mediastinum   HYPERTENSION, BENIGN   Last Assessment & Plan   01/16/2011 Office Visit Written 01/16/2011  3:45 PM by  Jonelle Sidle, MD     Continue followup with Dr. Juanetta Gosling. No changes to present regimen.    LBBB (left bundle branch block)   TIA (transient  ischemic attack)   Orthostatic hypotension   Syncope   Acute on chronic diastolic CHF (congestive heart failure), NYHA class 1   Hypertensive urgency     Endocrine   Hypothyroidism     Musculoskeletal and Integument   OSTEOARTHROSIS UNSPEC WHETHER GEN/LOCALIZED HAND     Other   HYPERLIPIDEMIA-MIXED   Last Assessment & Plan   07/03/2010 Office Visit Written 07/03/2010  1:01 PM by Dyann KiefMichele M Lenze, PA     stable    Nonspecific abnormal electrocardiogram (ECG) (EKG)   Last Assessment & Plan   01/16/2011 Office Visit Written 01/16/2011  3:45 PM by Jonelle SidleSamuel G McDowell, MD     IVCD and LBBB documented, asymptomatic and associated with normal LVEF and previously documented normal coronaries. No further workup at this time. Can followup as needed.    Current use of estrogen therapy       Imaging: Dg Chest 2 View  02/05/2014   CLINICAL DATA:  68 year old female with acute shortness of breath wheezing and nonproductive cough. Associated chest pain. Diagnosed with bronchitis 2 weeks ago. Initial encounter.  EXAM: CHEST  2 VIEW  COMPARISON:  12/08/2013 and earlier.  FINDINGS: Diffusely increased interstitial markings. Basilar predominance. Also linear markings along the periphery of both lungs suggesting Kerley B-lines. Normal cardiac size and mediastinal contours. Visualized tracheal air column is within normal limits. No pneumothorax. No definite pleural effusion. Partially visible anterior and posterior lower cervical spine hardware. No acute osseous abnormality identified.  IMPRESSION: Abnormal increased interstitial opacity diffusely. Top differential considerations include acute interstitial edema vs. acute viral/atypical pneumonia.   Electronically Signed   By: Augusto GambleLee  Hall M.D.   On: 02/05/2014 22:03

## 2014-02-14 NOTE — Assessment & Plan Note (Signed)
Thought to be related to orthostatic BP. She remains on florinef at lower dose and has not had any further symptoms. Close follow up in one month for evaluation of her BP on increased dose of coreg and review of ambulatory BP recordings.

## 2014-02-14 NOTE — Progress Notes (Signed)
24 hour ABPM in progress. Ambulatory Blood Pressure Monitor in progress.

## 2014-02-14 NOTE — Assessment & Plan Note (Signed)
Continues on Plavix. No further symptoms.

## 2014-02-14 NOTE — Assessment & Plan Note (Signed)
I have discussed with patient the need to take coreg BID as directed. Also, she is to stop the Alleve and begin Tylenol Arthritis strength instead to avoid use of NSAIDS contributing to hypertension. I will have a 24 hour ambulatory BP monitor attached to evaluate BP at home on appropriately dosed medications. I will recheck BMET on spironolactone.

## 2014-02-14 NOTE — Assessment & Plan Note (Signed)
No complaint of LEE or evidence of decompensation currently. Continue current medications.

## 2014-02-14 NOTE — Patient Instructions (Signed)
Your physician recommends that you schedule a follow-up appointment in: 1 month with [name], NP or DR. McDowell  Your physician has recommended you make the following change in your medication:   PLEASE TAKE CARVEDILOL 2 TIMES DAILY  Your physician recommends that you return for lab work NOW BMP  PLEASE SCHEDULE TO WEAR A BLOOD PRESSURE MONITOR FOR 24 HOURS  Thank you for choosing [facility]!!

## 2014-02-15 ENCOUNTER — Encounter: Payer: Self-pay | Admitting: Adult Health

## 2014-02-15 LAB — BASIC METABOLIC PANEL WITH GFR
BUN: 18 mg/dL (ref 6–23)
CALCIUM: 9 mg/dL (ref 8.4–10.5)
CO2: 28 meq/L (ref 19–32)
CREATININE: 0.86 mg/dL (ref 0.50–1.10)
Chloride: 108 mEq/L (ref 96–112)
GFR, EST AFRICAN AMERICAN: 81 mL/min
GFR, EST NON AFRICAN AMERICAN: 71 mL/min
GLUCOSE: 94 mg/dL (ref 70–99)
Potassium: 3.8 mEq/L (ref 3.5–5.3)
SODIUM: 143 meq/L (ref 135–145)

## 2014-02-15 NOTE — Progress Notes (Signed)
Reviewed ambulatory BP 24 recording. Average BP 164/72 with average HR 70 bpm.  She remains on cozaar 100mg  daily and florinef for orthostatic BP.  She is tolerating the BP at this level without evidence of significant hypotension, lowest BP 145/89.  Highest BP 202/97.  Will not make any changes in medication regimen at this time. Follow up in clinic.

## 2014-02-20 ENCOUNTER — Encounter: Payer: Self-pay | Admitting: Adult Health

## 2014-03-20 NOTE — Progress Notes (Signed)
    Error Cancelled  Appt  

## 2014-03-21 ENCOUNTER — Encounter: Payer: Self-pay | Admitting: Adult Health

## 2014-03-23 ENCOUNTER — Ambulatory Visit (INDEPENDENT_AMBULATORY_CARE_PROVIDER_SITE_OTHER): Payer: BC Managed Care – PPO | Admitting: Adult Health

## 2014-03-23 ENCOUNTER — Encounter: Payer: Self-pay | Admitting: Adult Health

## 2014-03-23 VITALS — BP 172/78 | HR 67 | Ht 65.0 in | Wt 155.4 lb

## 2014-03-23 DIAGNOSIS — I951 Orthostatic hypotension: Secondary | ICD-10-CM

## 2014-03-23 DIAGNOSIS — I5033 Acute on chronic diastolic (congestive) heart failure: Secondary | ICD-10-CM

## 2014-03-23 DIAGNOSIS — I10 Essential (primary) hypertension: Secondary | ICD-10-CM

## 2014-03-23 MED ORDER — FLUDROCORTISONE ACETATE 0.1 MG PO TABS: 0.0500 mg | ORAL_TABLET | Freq: Every day | ORAL | Status: DC

## 2014-03-23 NOTE — Patient Instructions (Signed)
Your physician wants you to follow-up in: 6 MONTHS You will receive a reminder letter in the mail two months in advance. If you don't receive a letter, please call our office to schedule the follow-up appointment.  Your physician recommends that you continue on your current medications as directed. Please refer to the Current Medication list given to you today.  Thank you for choosing [facility]!!    

## 2014-03-23 NOTE — Assessment & Plan Note (Signed)
BP is a little elevated today, but I rechecked it with BP 158/70 after having her rest and talk. She is complaint with carvedilol. Will continue current medication regimen and see her in 6 months unless symptomatic.

## 2014-03-23 NOTE — Progress Notes (Deleted)
Name: [name]    DOB: February 22, 1946  Age: 68 y.o.  MR#: [id number]       PCP:  Fredirick Maudlin, MD      Insurance: Payor: BLUE CROSS BLUE SHIELD / Plan: BCBS PPO OUT OF STATE / Product Type: *No Product type* /   CC:    Chief Complaint  Patient presents with  . Loss of Consciousness  . Hypertension  . Congestive Heart Failure    Systolic    VS Filed Vitals:   03/23/14 1358  BP: 172/78  Pulse: 67  Height: 5\' 5"  (1.651 m)  Weight: 155 lb 6.4 oz (70.489 kg)  SpO2: 98%    Weights Current Weight  03/23/14 155 lb 6.4 oz (70.489 kg)  02/14/14 152 lb (68.947 kg)  02/07/14 150 lb 11.2 oz (68.357 kg)    Blood Pressure  BP Readings from Last 3 Encounters:  03/23/14 172/78  02/14/14 186/80  02/07/14 170/65     Admit date:  (Not on file) Last encounter with RMR:  02/14/2014   Allergy Codeine; Lisinopril; Other; and Latex  Current Outpatient Prescriptions  Medication Sig Dispense Refill  . ALPRAZolam (XANAX) 0.25 MG tablet Take 1 tablet (0.25 mg total) by mouth 3 (three) times daily as needed for anxiety. 30 tablet 0  . calcium-vitamin D (OSCAL WITH D) 500-200 MG-UNIT per tablet Take 1 tablet by mouth at bedtime.     . carboxymethylcellulose (REFRESH PLUS) 0.5 % SOLN Place 1 drop into the right eye daily as needed (dry eye).    . carvedilol (COREG) 6.25 MG tablet Take 1 tablet (6.25 mg total) by mouth 2 (two) times daily. 60 tablet 6  . clopidogrel (PLAVIX) 75 MG tablet Take 1 tablet (75 mg total) by mouth daily. 30 tablet 12  . fludrocortisone (FLORINEF) 0.1 MG tablet Take 1 tablet (0.1 mg total) by mouth daily. 30 tablet 12  . levothyroxine (SYNTHROID, LEVOTHROID) 75 MCG tablet Take 75 mcg by mouth daily.      Marland Kitchen losartan (COZAAR) 100 MG tablet Take 1 tablet (100 mg total) by mouth daily. 30 tablet 12  . multivitamin-iron-minerals-folic acid (CENTRUM) chewable tablet Chew 1 tablet by mouth at bedtime.     . pravastatin (PRAVACHOL) 20 MG tablet Take 1 tablet (20 mg total) by  mouth daily. 30 tablet 12  . spironolactone (ALDACTONE) 12.5 mg TABS tablet Take 0.5 tablets (12.5 mg total) by mouth daily. 15 tablet 12   No current facility-administered medications for this visit.    Discontinued Meds:    Medications Discontinued During This Encounter  Medication Reason  . aspirin 81 MG tablet Error    Patient Active Problem List   Diagnosis Date Noted  . Acute on chronic diastolic CHF (congestive heart failure), NYHA class 1 02/06/2014  . Hypertensive urgency 02/06/2014  . LBBB (left bundle branch block) 12/09/2013  . Hypothyroidism 12/09/2013  . Syncope 12/08/2013  . Orthostatic hypotension 12/07/2013  . TIA (transient ischemic attack) 12/05/2013  . Current use of estrogen therapy 03/25/2013  . Nonspecific abnormal electrocardiogram (ECG) (EKG) 01/03/2011  . HYPERLIPIDEMIA-MIXED 05/08/2010  . HYPERTENSION, BENIGN 05/08/2010  . OSTEOARTHROSIS UNSPEC WHETHER GEN/LOCALIZED HAND 01/24/2010    LABS    Component Value Date/Time   NA 143 02/14/2014 1421   NA 145 02/07/2014 0558   NA 146 02/05/2014 2235   K 3.8 02/14/2014 1421   K 3.5* 02/07/2014 0558   K 3.7 02/05/2014 2235   CL 108 02/14/2014 1421   CL 105 02/07/2014  0558   CL 107 02/05/2014 2235   CO2 28 02/14/2014 1421   CO2 28 02/07/2014 0558   CO2 29 02/05/2014 2235   GLUCOSE 94 02/14/2014 1421   GLUCOSE 97 02/07/2014 0558   GLUCOSE 100* 02/05/2014 2235   BUN 18 02/14/2014 1421   BUN 19 02/07/2014 0558   BUN 18 02/05/2014 2235   CREATININE 0.86 02/14/2014 1421   CREATININE 0.76 02/07/2014 0558   CREATININE 0.78 02/05/2014 2235   CREATININE 0.87 12/08/2013 0917   CALCIUM 9.0 02/14/2014 1421   CALCIUM 9.3 02/07/2014 0558   CALCIUM 9.5 02/05/2014 2235   GFRNONAA 71 02/14/2014 1421   GFRNONAA 86* 02/07/2014 0558   GFRNONAA 85* 02/05/2014 2235   GFRNONAA 68* 12/08/2013 0917   GFRAA 81 02/14/2014 1421   GFRAA >90 02/07/2014 0558   GFRAA >90 02/05/2014 2235   GFRAA 79* 12/08/2013 0917    CMP     Component Value Date/Time   NA 143 02/14/2014 1421   K 3.8 02/14/2014 1421   CL 108 02/14/2014 1421   CO2 28 02/14/2014 1421   GLUCOSE 94 02/14/2014 1421   BUN 18 02/14/2014 1421   CREATININE 0.86 02/14/2014 1421   CREATININE 0.76 02/07/2014 0558   CALCIUM 9.0 02/14/2014 1421   PROT 7.4 12/08/2013 0917   ALBUMIN 4.2 12/08/2013 0917   AST 23 12/08/2013 0917   ALT 15 12/08/2013 0917   ALKPHOS 93 12/08/2013 0917   BILITOT 0.5 12/08/2013 0917   GFRNONAA 71 02/14/2014 1421   GFRNONAA 86* 02/07/2014 0558   GFRAA 81 02/14/2014 1421   GFRAA >90 02/07/2014 0558       Component Value Date/Time   WBC 5.9 02/05/2014 2235   WBC 4.9 12/08/2013 0917   WBC 6.0 12/05/2013 1813   HGB 13.1 02/05/2014 2235   HGB 14.1 12/08/2013 0917   HGB 13.5 12/05/2013 1813   HCT 38.0 02/05/2014 2235   HCT 39.9 12/08/2013 0917   HCT 38.6 12/05/2013 1813   MCV 90.0 02/05/2014 2235   MCV 87.9 12/08/2013 0917   MCV 89.1 12/05/2013 1813    Lipid Panel     Component Value Date/Time   CHOL 140 02/06/2014 0616   TRIG 100 02/06/2014 0616   HDL 66 02/06/2014 0616   CHOLHDL 2.1 02/06/2014 0616   VLDL 20 02/06/2014 0616   LDLCALC 54 02/06/2014 0616    ABG No results found for: PHART, PCO2ART, PO2ART, HCO3, TCO2, ACIDBASEDEF, O2SAT   Lab Results  Component Value Date   TSH 6.000* 02/05/2014   BNP (last 3 results)  Recent Labs  02/05/14 2235  PROBNP 2140.0*   Cardiac Panel (last 3 results) No results for input(s): CKTOTAL, CKMB, TROPONINI, RELINDX in the last 72 hours.  Iron/TIBC/Ferritin/ %Sat No results found for: IRON, TIBC, FERRITIN, IRONPCTSAT   EKG Orders placed or performed in visit on 02/14/14  . AMB BP MONITORING     Prior Assessment and Plan Problem List as of 03/23/2014      Cardiovascular and Mediastinum   HYPERTENSION, BENIGN   Last Assessment & Plan   02/14/2014 Office Visit Written 02/14/2014  1:36 PM by Jodelle GrossKathryn M Lawrence, NP    I have discussed with  patient the need to take coreg BID as directed. Also, she is to stop the Alleve and begin Tylenol Arthritis strength instead to avoid use of NSAIDS contributing to hypertension. I will have a 24 hour ambulatory BP monitor attached to evaluate BP at home on appropriately dosed medications. I will  recheck BMET on spironolactone.     LBBB (left bundle branch block)   TIA (transient ischemic attack)   Last Assessment & Plan   02/14/2014 Office Visit Written 02/14/2014  1:38 PM by Jodelle GrossKathryn M Lawrence, NP    Continues on Plavix. No further symptoms.     Orthostatic hypotension   Syncope   Last Assessment & Plan   02/14/2014 Office Visit Written 02/14/2014  1:38 PM by Jodelle GrossKathryn M Lawrence, NP    Thought to be related to orthostatic BP. She remains on florinef at lower dose and has not had any further symptoms. Close follow up in one month for evaluation of her BP on increased dose of coreg and review of ambulatory BP recordings.     Acute on chronic diastolic CHF (congestive heart failure), NYHA class 1   Last Assessment & Plan   02/14/2014 Office Visit Written 02/14/2014  1:37 PM by Jodelle GrossKathryn M Lawrence, NP    No complaint of LEE or evidence of decompensation currently. Continue current medications.     Hypertensive urgency     Endocrine   Hypothyroidism     Musculoskeletal and Integument   OSTEOARTHROSIS UNSPEC WHETHER GEN/LOCALIZED HAND     Other   HYPERLIPIDEMIA-MIXED   Last Assessment & Plan   07/03/2010 Office Visit Written 07/03/2010  1:01 PM by Dyann KiefMichele M Lenze, PA    stable    Nonspecific abnormal electrocardiogram (ECG) (EKG)   Last Assessment & Plan   01/16/2011 Office Visit Written 01/16/2011  3:45 PM by Jonelle SidleSamuel G McDowell, MD    IVCD and LBBB documented, asymptomatic and associated with normal LVEF and previously documented normal coronaries. No further workup at this time. Can followup as needed.    Current use of estrogen therapy       Imaging: No results  found.

## 2014-03-23 NOTE — Progress Notes (Signed)
HPI: [name] is a 68 year old patient of Dr. Diona [name], that we are following for ongoing assessment and management of systolic heart failure with EF of 45%, hypertension, not well-controlled, with history of recent hospitalization in October for syncopal episode. 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  On last office visit. It was noted that she was not taking her Coreg as directed. She was taking it once a day instead of twice a day she was instructed on dosage. She was also advised to stop Aleve and begin Tylenol arthritis strength as use of end-stage would be contributing to hypertension. A 24-hour, and Lipitor. A blood pressure monitor was ordered. He BMET was checked to evaluate her kidney status on spironolactone.   I reviewed ambulatory BP 24 recording. Average BP 164/72 with average HR 70 bpm. She remains on cozaar 100mg  daily and florinef for orthostatic BP. She is tolerating the BP at this level without evidence of significant hypotension, lowest BP 145/89. Highest BP 202/97. I made no changes in medication regimen.   She is doing well. Had wisdom tooth removed on lower left. She is without complaint. She is taking carvedilol BID as directed.   Allergies  Allergen Reactions  . Codeine Nausea And Vomiting  . Lisinopril Cough  . Other Nausea And Vomiting    Most anesthesia   . Latex Rash    Current Outpatient Prescriptions  Medication Sig Dispense Refill  . ALPRAZolam (XANAX) 0.25 MG tablet Take 1 tablet (0.25 mg total) by mouth 3 (three) times daily as needed for anxiety. 30 tablet 0  . calcium-vitamin D (OSCAL WITH D) 500-200 MG-UNIT per tablet Take 1 tablet by mouth at bedtime.     . carboxymethylcellulose (REFRESH PLUS) 0.5 % SOLN Place 1 drop into the right eye daily as needed (dry eye).    . carvedilol (COREG) 6.25 MG tablet Take 1 tablet (6.25 mg total) by mouth 2 (two) times daily. 60 tablet 6  .  clopidogrel (PLAVIX) 75 MG tablet Take 1 tablet (75 mg total) by mouth daily. 30 tablet 12  . fludrocortisone (FLORINEF) 0.1 MG tablet Take 1 tablet (0.1 mg total) by mouth daily. 30 tablet 12  . levothyroxine (SYNTHROID, LEVOTHROID) 75 MCG tablet Take 75 mcg by mouth daily.      Marland Kitchen. losartan (COZAAR) 100 MG tablet Take 1 tablet (100 mg total) by mouth daily. 30 tablet 12  . multivitamin-iron-minerals-folic acid (CENTRUM) chewable tablet Chew 1 tablet by mouth at bedtime.     . pravastatin (PRAVACHOL) 20 MG tablet Take 1 tablet (20 mg total) by mouth daily. 30 tablet 12  . spironolactone (ALDACTONE) 12.5 mg TABS tablet Take 0.5 tablets (12.5 mg total) by mouth daily. 15 tablet 12   No current facility-administered medications for this visit.    Past Medical History  Diagnosis Date  . Essential hypertension   . Hyperlipidemia   . Hypothyroidism   . GERD (gastroesophageal reflux disease)   . Orthostatic hypotension   . Arthritis   . Current use of estrogen therapy 03/25/2013  . LBBB (left bundle branch block)   . H/O cardiac catheterization     2011: normal coronary arteries (false positive stress test).  Marland Kitchen. TIA (transient ischemic attack)     On Plavix    Past Surgical History  Procedure Laterality Date  . Partial hysterectomy    . Neck surgery      Secondary to ruptured disc  .  Colonoscopy  12/25/2010    Procedure: COLONOSCOPY;  Surgeon: Malissa HippoNajeeb U Rehman, MD;  Location: [facility];  Service: Endoscopy;  Laterality: N/A;  . Abdominal hysterectomy    . Wrist fusion      Right- torn ligament  . Posterior cervical fusion/foraminotomy  12/18/2011    Procedure: POSTERIOR CERVICAL FUSION/FORAMINOTOMY LEVEL 2;  Surgeon: Hewitt Shortsobert W Nudelman, MD;  Location: [facility];  Service: Neurosurgery;  Laterality: Bilateral;  Cervical four to six Posterior cervical arthrodesis    ROS: Complete review of systems performed and found to be negative unless outlined above  PHYSICAL EXAM BP 172/78 mmHg   Pulse 67  Ht 5\' 5"  (1.651 m)  Wt 155 lb 6.4 oz (70.489 kg)  BMI 25.86 kg/m2  SpO2 98% General: Well developed, well nourished, in no acute distress Head: Eyes PERRLA, No xanthomas.   Normal cephalic and atramatic  Lungs: Clear bilaterally to auscultation and percussion. Heart: HRRR S1 S2, without MRG.  Pulses are 2+ & equal.            No carotid bruit. No JVD.  No abdominal bruits. No femoral bruits. Abdomen: Bowel sounds are positive, abdomen soft and non-tender without masses or                  Hernia's noted. Msk:  Back normal, normal gait. Normal strength and tone for age. Extremities: No clubbing, cyanosis or edema.  DP +1 Neuro: Alert and oriented X 3. Psych:  Good affect, responds appropriately   ASSESSMENT AND PLAN

## 2014-03-23 NOTE — Assessment & Plan Note (Signed)
She is no longer symptomatic with this on low dose Florinef.  Will continue this. No other recommendations at this time.

## 2014-03-23 NOTE — Assessment & Plan Note (Signed)
No evidence of decompensation on examination. No changes in regimen. She is advised on low sodium diet.

## 2014-11-13 ENCOUNTER — Ambulatory Visit (INDEPENDENT_AMBULATORY_CARE_PROVIDER_SITE_OTHER): Payer: BLUE CROSS/BLUE SHIELD | Admitting: Adult Health

## 2014-11-13 ENCOUNTER — Encounter: Payer: Self-pay | Admitting: Adult Health

## 2014-11-13 VITALS — BP 132/60 | HR 72 | Ht 65.0 in | Wt 157.2 lb

## 2014-11-13 DIAGNOSIS — I5023 Acute on chronic systolic (congestive) heart failure: Secondary | ICD-10-CM

## 2014-11-13 DIAGNOSIS — R55 Syncope and collapse: Secondary | ICD-10-CM

## 2014-11-13 NOTE — Progress Notes (Signed)
Cardiology Office Note   Date:  11/13/2014   ID:  Laura, [name] Jul 21, 1945, MRN [id number]  PCP:  Fredirick Maudlin, MD  Cardiologist:  [place]/ Laura Reining, NP   Chief Complaint  Patient presents with  . Congestive Heart Failure    Systolic      History of Present Illness: [name] is a 69 y.o. female who presents for ongoing assessment and management of systolic heart failure with EF of 45%, hypertension, which is difficult to control.  She was last seen in the office in December 2015 had not been adhering to her medication regimen.  Blood pressure at last visit was rechecked manually during examination in the clinic room with a blood pressure of 158/70.  The patient was seen by Dr. Juanetta [name] on 10/30/2014, and was found to be hypotensive.  The patient states that she had passed out and her blood pressure dropped.  She has a history of orthostatic hypotension, but syncopal episode occurred while she was sitting.  She has chronic muscle skeletal pain with her neck.  She was referred to cardiology to evaluate for need for cardiac monitor.  She was started on tramadol for neck pain, by Dr. Juanetta [name].  She states that she has been having this happen almost daily. Sometimes occurs when she is sititng, sometimes when she is walking. She works at [employer] and is at work at 4 am. 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 She does not have any significant dizziness when she does this. She notices this mostly after taking her medications.  She denies palpitations.   She has chronic pain in her neck and she is wondering if the pain is causing her to feel lightheaded and dizzy.  She is not on any medications for pain control.  She was given tramadol, but does not like to take it because she states it makes her breathing heavy.  Past Medical History  Diagnosis Date  . Essential hypertension   . Hyperlipidemia   . Hypothyroidism   . GERD (gastroesophageal reflux disease)   .  Orthostatic hypotension   . Arthritis   . Current use of estrogen therapy 03/25/2013  . LBBB (left bundle branch block)   . H/O cardiac catheterization     2011: normal coronary arteries (false positive stress test).  Marland Kitchen TIA (transient ischemic attack)     On Plavix    Past Surgical History  Procedure Laterality Date  . Partial hysterectomy    . Neck surgery      Secondary to ruptured disc  . Colonoscopy  12/25/2010    Procedure: COLONOSCOPY;  Surgeon: Malissa Hippo, MD;  Location: [facility];  Service: Endoscopy;  Laterality: N/A;  . Abdominal hysterectomy    . Wrist fusion      Right- torn ligament  . Posterior cervical fusion/foraminotomy  12/18/2011    Procedure: POSTERIOR CERVICAL FUSION/FORAMINOTOMY LEVEL 2;  Surgeon: Hewitt Shorts, MD;  Location: [facility];  Service: Neurosurgery;  Laterality: Bilateral;  Cervical four to six Posterior cervical arthrodesis     Current Outpatient Prescriptions  Medication Sig Dispense Refill  . ALPRAZolam (XANAX) 0.25 MG tablet Take 1 tablet (0.25 mg total) by mouth 3 (three) times daily as needed for anxiety. 30 tablet 0  . calcium-vitamin D (OSCAL WITH D) 500-200 MG-UNIT per tablet Take 1 tablet by mouth at bedtime.     . carboxymethylcellulose (REFRESH PLUS) 0.5 % SOLN Place 1 drop into the right eye daily  as needed (dry eye).    . carvedilol (COREG) 6.25 MG tablet Take 1 tablet (6.25 mg total) by mouth 2 (two) times daily. 60 tablet 6  . clopidogrel (PLAVIX) 75 MG tablet Take 1 tablet (75 mg total) by mouth daily. 30 tablet 12  . fludrocortisone (FLORINEF) 0.1 MG tablet Take 0.5 tablets (0.05 mg total) by mouth daily. 30 tablet 12  . levothyroxine (SYNTHROID, LEVOTHROID) 75 MCG tablet Take 75 mcg by mouth daily.      Marland Kitchen losartan (COZAAR) 100 MG tablet Take 1 tablet (100 mg total) by mouth daily. 30 tablet 12  . multivitamin-iron-minerals-folic acid (CENTRUM) chewable tablet Chew 1 tablet by mouth at bedtime.     . pravastatin  (PRAVACHOL) 20 MG tablet Take 1 tablet (20 mg total) by mouth daily. 30 tablet 12  . spironolactone (ALDACTONE) 12.5 mg TABS tablet Take 0.5 tablets (12.5 mg total) by mouth daily. 15 tablet 12  . estradiol (VIVELLE-DOT) 0.05 MG/24HR patch APPLY 1 PATCH TWICE WEEKLY  12   No current facility-administered medications for this visit.    Allergies:   Codeine; Lisinopril; Other; and Latex    Social History:  The patient  reports that she has never smoked. She has never used smokeless tobacco. She reports that she does not drink alcohol or use illicit drugs.   Family History:  The patient's family history includes Cancer in her brother; Colon cancer in her mother; Diabetes in her father; Lung cancer in her mother.    ROS: .   All other systems are reviewed and negative.Unless otherwise mentioned in H&P above.   PHYSICAL EXAM: VS:  BP 132/60 mmHg  Pulse 72  Ht 5\' 5"  (1.651 m)  Wt 157 lb 3.2 oz (71.305 kg)  BMI 26.16 kg/m2  SpO2 98% , BMI Body mass index is 26.16 kg/(m^2). GEN: Well nourished, well developed, in no acute distress HEENT: normal Neck: no JVD, carotid bruits, or masses Cardiac: RRR; no murmurs, rubs, or gallops,no edema  Respiratory:  clear to auscultation bilaterally, normal work of breathing GI: soft, nontender, nondistended, + BS MS: no deformity or atrophybruising is a noted on the left elbow and upper forearm. Skin: warm and dry, no rash Neuro:  Strength and sensation are intact Psych: euthymic mood, full affect   Recent Labs: 12/08/2013: ALT 15 02/05/2014: Hemoglobin 13.1; Platelets 222; Pro B Natriuretic peptide (BNP) 2140.0*; TSH 6.000* 02/14/2014: BUN 18; Creat 0.86; Potassium 3.8; Sodium 143    Lipid Panel    Component Value Date/Time   CHOL 140 02/06/2014 0616   TRIG 100 02/06/2014 0616   HDL 66 02/06/2014 0616   CHOLHDL 2.1 02/06/2014 0616   VLDL 20 02/06/2014 0616   LDLCALC 54 02/06/2014 0616      Wt Readings from Last 3 Encounters:  11/13/14  157 lb 3.2 oz (71.305 kg)  03/23/14 155 lb 6.4 oz (70.489 kg)  02/14/14 152 lb (68.947 kg)      Other studies Reviewed: Additional studies/ records that were reviewed today include: None Review of the above records demonstrates: N/A   ASSESSMENT AND PLAN:  1. Syncope: we will place a to a cardiac monitor to evaluate for arrhythmias causing her to have "blackouts" along with lightheadedness and dizziness.  She states she can feel when her blood pressure drops.  We will also discontinue spiral lactone temporarily.  She may need to have a higher baseline blood pressure to prevent syncope.  If she does not have any abnormal rhythms on cardiac monitor.  May need to consider loop recorder if she continues symptomatic with removal of spironolactone.  The patient will be seen again in one month.  2. Chronic Systolic Dysfunction: Most recent echocardiogram demonstrated EF of 45%.  I will repeat her echocardiogram for evaluation of LV function.  She will continue on carvedilol twice a day as directed.  If there is improvement in her echocardiogram from normal function we will discuss medication adjustments if warranted.  3. Hypertension: Currently blood pressure is controlled.I have asked her to take her blood pressure daily at the same time, and provided 80, blood pressure recording sheet in order for her to document her blood pressure  Daily.  She will bring this with her on her next appointment.   Current medicines are reviewed at length with the patient today.    Labs/ tests ordered today include: Cardiac Monitor, Echocardiogram.  No orders of the defined types were placed in this encounter.     Disposition:   FU with one month.  Signed, Laura Reining, NP  11/13/2014 1:49 PM    [facility] [address] [place], [place] [address] Phone: [phone]; Fax: [phone]

## 2014-11-13 NOTE — Progress Notes (Deleted)
Name: [name]    DOB: 02-07-1946  Age: 69 y.o.  MR#: [id number]       PCP:  Fredirick Maudlin, MD      Insurance: Payor: MEDICARE / Plan: MEDICARE PART A / Product Type: *No Product type* /   CC:    Chief Complaint  Patient presents with  . Congestive Heart Failure    Systolic    VS Filed Vitals:   11/13/14 1327  BP: 132/60  Pulse: 72  Height:  (1.651 m)  Weight: 157 lb 3.2 oz (71.305 kg)  SpO2: 98%    Weights Current Weight  11/13/14 157 lb 3.2 oz (71.305 kg)  03/23/14 155 lb 6.4 oz (70.489 kg)  02/14/14 152 lb (68.947 kg)    Blood Pressure  BP Readings from Last 3 Encounters:  11/13/14 132/60  03/23/14 172/78  02/14/14 186/80     Admit date:  (Not on file) Last encounter with RMR:  Visit date not found   Allergy Codeine; Lisinopril; Other; and Latex  Current Outpatient Prescriptions  Medication Sig Dispense Refill  . ALPRAZolam (XANAX) 0.25 MG tablet Take 1 tablet (0.25 mg total) by mouth 3 (three) times daily as needed for anxiety. 30 tablet 0  . calcium-vitamin D (OSCAL WITH D) 500-200 MG-UNIT per tablet Take 1 tablet by mouth at bedtime.     . carboxymethylcellulose (REFRESH PLUS) 0.5 % SOLN Place 1 drop into the right eye daily as needed (dry eye).    . carvedilol (COREG) 6.25 MG tablet Take 1 tablet (6.25 mg total) by mouth 2 (two) times daily. 60 tablet 6  . clopidogrel (PLAVIX) 75 MG tablet Take 1 tablet (75 mg total) by mouth daily. 30 tablet 12  . fludrocortisone (FLORINEF) 0.1 MG tablet Take 0.5 tablets (0.05 mg total) by mouth daily. 30 tablet 12  . levothyroxine (SYNTHROID, LEVOTHROID) 75 MCG tablet Take 75 mcg by mouth daily.      Marland Kitchen losartan (COZAAR) 100 MG tablet Take 1 tablet (100 mg total) by mouth daily. 30 tablet 12  . multivitamin-iron-minerals-folic acid (CENTRUM) chewable tablet Chew 1 tablet by mouth at bedtime.     . pravastatin (PRAVACHOL) 20 MG tablet Take 1 tablet (20 mg total) by mouth daily. 30 tablet 12  . spironolactone  (ALDACTONE) 12.5 mg TABS tablet Take 0.5 tablets (12.5 mg total) by mouth daily. 15 tablet 12  . estradiol (VIVELLE-DOT) 0.05 MG/24HR patch APPLY 1 PATCH TWICE WEEKLY  12   No current facility-administered medications for this visit.    Discontinued Meds:   There are no discontinued medications.  Patient Active Problem List   Diagnosis Date Noted  . Acute on chronic diastolic CHF (congestive heart failure), NYHA class 1 02/06/2014  . Hypertensive urgency 02/06/2014  . LBBB (left bundle branch block) 12/09/2013  . Hypothyroidism 12/09/2013  . Syncope 12/08/2013  . Orthostatic hypotension 12/07/2013  . TIA (transient ischemic attack) 12/05/2013  . Current use of estrogen therapy 03/25/2013  . Nonspecific abnormal electrocardiogram (ECG) (EKG) 01/03/2011  . HYPERLIPIDEMIA-MIXED 05/08/2010  . HYPERTENSION, BENIGN 05/08/2010  . OSTEOARTHROSIS UNSPEC WHETHER GEN/LOCALIZED HAND 01/24/2010    LABS    Component Value Date/Time   NA 143 02/14/2014 1421   NA 145 02/07/2014 0558   NA 146 02/05/2014 2235   K 3.8 02/14/2014 1421   K 3.5* 02/07/2014 0558   K 3.7 02/05/2014 2235   CL 108 02/14/2014 1421   CL 105 02/07/2014 0558   CL 107 02/05/2014 2235  CO2 28 02/14/2014 1421   CO2 28 02/07/2014 0558   CO2 29 02/05/2014 2235   GLUCOSE 94 02/14/2014 1421   GLUCOSE 97 02/07/2014 0558   GLUCOSE 100* 02/05/2014 2235   BUN 18 02/14/2014 1421   BUN 19 02/07/2014 0558   BUN 18 02/05/2014 2235   CREATININE 0.86 02/14/2014 1421   CREATININE 0.76 02/07/2014 0558   CREATININE 0.78 02/05/2014 2235   CREATININE 0.87 12/08/2013 0917   CALCIUM 9.0 02/14/2014 1421   CALCIUM 9.3 02/07/2014 0558   CALCIUM 9.5 02/05/2014 2235   GFRNONAA 71 02/14/2014 1421   GFRNONAA 86* 02/07/2014 0558   GFRNONAA 85* 02/05/2014 2235   GFRNONAA 68* 12/08/2013 0917   GFRAA 81 02/14/2014 1421   GFRAA >90 02/07/2014 0558   GFRAA >90 02/05/2014 2235   GFRAA 79* 12/08/2013 0917   CMP     Component Value  Date/Time   NA 143 02/14/2014 1421   K 3.8 02/14/2014 1421   CL 108 02/14/2014 1421   CO2 28 02/14/2014 1421   GLUCOSE 94 02/14/2014 1421   BUN 18 02/14/2014 1421   CREATININE 0.86 02/14/2014 1421   CREATININE 0.76 02/07/2014 0558   CALCIUM 9.0 02/14/2014 1421   PROT 7.4 12/08/2013 0917   ALBUMIN 4.2 12/08/2013 0917   AST 23 12/08/2013 0917   ALT 15 12/08/2013 0917   ALKPHOS 93 12/08/2013 0917   BILITOT 0.5 12/08/2013 0917   GFRNONAA 71 02/14/2014 1421   GFRNONAA 86* 02/07/2014 0558   GFRAA 81 02/14/2014 1421   GFRAA >90 02/07/2014 0558       Component Value Date/Time   WBC 5.9 02/05/2014 2235   WBC 4.9 12/08/2013 0917   WBC 6.0 12/05/2013 1813   HGB 13.1 02/05/2014 2235   HGB 14.1 12/08/2013 0917   HGB 13.5 12/05/2013 1813   HCT 38.0 02/05/2014 2235   HCT 39.9 12/08/2013 0917   HCT 38.6 12/05/2013 1813   MCV 90.0 02/05/2014 2235   MCV 87.9 12/08/2013 0917   MCV 89.1 12/05/2013 1813    Lipid Panel     Component Value Date/Time   CHOL 140 02/06/2014 0616   TRIG 100 02/06/2014 0616   HDL 66 02/06/2014 0616   CHOLHDL 2.1 02/06/2014 0616   VLDL 20 02/06/2014 0616   LDLCALC 54 02/06/2014 0616    ABG No results found for: PHART, PCO2ART, PO2ART, HCO3, TCO2, ACIDBASEDEF, O2SAT   Lab Results  Component Value Date   TSH 6.000* 02/05/2014   BNP (last 3 results) No results for input(s): BNP in the last 8760 hours.  ProBNP (last 3 results)  Recent Labs  02/05/14 2235  PROBNP 2140.0*    Cardiac Panel (last 3 results) No results for input(s): CKTOTAL, CKMB, TROPONINI, RELINDX in the last 72 hours.  Iron/TIBC/Ferritin/ %Sat No results found for: IRON, TIBC, FERRITIN, IRONPCTSAT   EKG Orders placed or performed in visit on 02/14/14  . AMB BP MONITORING     Prior Assessment and Plan Problem List as of 11/13/2014      Cardiovascular and Mediastinum   HYPERTENSION, BENIGN   Last Assessment & Plan 03/23/2014 Office Visit Written 03/23/2014  2:23 PM by Jodelle Gross, NP    BP is a little elevated today, but I rechecked it with BP 158/70 after having her rest and talk. She is complaint with carvedilol. Will continue current medication regimen and see her in 6 months unless symptomatic.       LBBB (left bundle branch block)  TIA (transient ischemic attack)   Last Assessment & Plan 02/14/2014 Office Visit Written 02/14/2014  1:38 PM by Jodelle Gross, NP    Continues on Plavix. No further symptoms.       Orthostatic hypotension   Last Assessment & Plan 03/23/2014 Office Visit Written 03/23/2014  2:24 PM by Jodelle Gross, NP    She is no longer symptomatic with this on low dose Florinef.  Will continue this. No other recommendations at this time.       Syncope   Last Assessment & Plan 02/14/2014 Office Visit Written 02/14/2014  1:38 PM by Jodelle Gross, NP    Thought to be related to orthostatic BP. She remains on florinef at lower dose and has not had any further symptoms. Close follow up in one month for evaluation of her BP on increased dose of coreg and review of ambulatory BP recordings.       Acute on chronic diastolic CHF (congestive heart failure), NYHA class 1   Last Assessment & Plan 03/23/2014 Office Visit Written 03/23/2014  2:25 PM by Jodelle Gross, NP    No evidence of decompensation on examination. No changes in regimen. She is advised on low sodium diet.       Hypertensive urgency     Endocrine   Hypothyroidism     Musculoskeletal and Integument   OSTEOARTHROSIS UNSPEC WHETHER GEN/LOCALIZED HAND     Other   HYPERLIPIDEMIA-MIXED   Last Assessment & Plan 07/03/2010 Office Visit Written 07/03/2010  1:01 PM by Dyann Kief, PA    stable      Nonspecific abnormal electrocardiogram (ECG) (EKG)   Last Assessment & Plan 01/16/2011 Office Visit Written 01/16/2011  3:45 PM by Jonelle Sidle, MD    IVCD and LBBB documented, asymptomatic and associated with normal LVEF and previously documented normal  coronaries. No further workup at this time. Can followup as needed.      Current use of estrogen therapy       Imaging: No results found.

## 2014-11-13 NOTE — Patient Instructions (Addendum)
Your physician recommends that you schedule a follow-up appointment in: 1 month with Harriet Pho, NP, or Dr. Diona Browner.  Your physician has recommended you make the following change in your medication:  Stop taking Spironolactone  Your physician has requested that you have an echocardiogram. Echocardiography is a painless test that uses sound waves to create images of your heart. It provides your doctor with information about the size and shape of your heart and how well your heart's chambers and valves are working. This procedure takes approximately one hour. There are no restrictions for this procedure.  Your physician has recommended that you wear an event monitor. Event monitors are medical devices that record the heart's electrical activity. Doctors most often [place] these monitors to diagnose arrhythmias. Arrhythmias are problems with the speed or rhythm of the heartbeat. The monitor is a small, portable device. You can wear one while you do your normal daily activities. This is usually used to diagnose what is causing palpitations/syncope (passing out). This monitor will be mailed to you.  Thank you for choosing [facility]!

## 2014-11-20 ENCOUNTER — Ambulatory Visit (HOSPITAL_COMMUNITY)
Admission: RE | Admit: 2014-11-20 | Discharge: 2014-11-20 | Disposition: A | Discharge status: Home / Self Care | Payer: BLUE CROSS/BLUE SHIELD | Source: Ambulatory Visit | Attending: Adult Health | Admitting: Adult Health

## 2014-11-20 DIAGNOSIS — I447 Left bundle-branch block, unspecified: Secondary | ICD-10-CM | POA: Diagnosis not present

## 2014-11-20 DIAGNOSIS — I5023 Acute on chronic systolic (congestive) heart failure: Secondary | ICD-10-CM | POA: Insufficient documentation

## 2014-11-20 DIAGNOSIS — I10 Essential (primary) hypertension: Secondary | ICD-10-CM | POA: Insufficient documentation

## 2014-11-20 DIAGNOSIS — G459 Transient cerebral ischemic attack, unspecified: Secondary | ICD-10-CM | POA: Diagnosis not present

## 2014-11-20 DIAGNOSIS — I081 Rheumatic disorders of both mitral and tricuspid valves: Secondary | ICD-10-CM | POA: Diagnosis not present

## 2014-11-20 DIAGNOSIS — R55 Syncope and collapse: Secondary | ICD-10-CM | POA: Insufficient documentation

## 2014-11-20 DIAGNOSIS — E785 Hyperlipidemia, unspecified: Secondary | ICD-10-CM | POA: Diagnosis not present

## 2014-11-20 NOTE — Progress Notes (Signed)
  Echocardiogram 2D Echocardiogram has been performed.  [name] 11/20/2014, 3:09 PM

## 2014-12-14 ENCOUNTER — Encounter: Payer: Self-pay | Admitting: Adult Health

## 2014-12-14 ENCOUNTER — Ambulatory Visit (INDEPENDENT_AMBULATORY_CARE_PROVIDER_SITE_OTHER): Payer: BLUE CROSS/BLUE SHIELD | Admitting: Adult Health

## 2014-12-14 VITALS — BP 204/84 | HR 68 | Ht 65.0 in | Wt 159.0 lb

## 2014-12-14 DIAGNOSIS — R55 Syncope and collapse: Secondary | ICD-10-CM

## 2014-12-14 DIAGNOSIS — I519 Heart disease, unspecified: Secondary | ICD-10-CM

## 2014-12-14 DIAGNOSIS — I10 Essential (primary) hypertension: Secondary | ICD-10-CM

## 2014-12-14 NOTE — Progress Notes (Signed)
Cardiology Office Note   Date:  12/14/2014   ID:  [name], Laura 09/19/1945, MRN [id number]  PCP:  Laura Maudlin, MD  Cardiologist:  [place]/ Joni Reining, NP   Chief Complaint  Patient presents with  . Congestive Heart Failure  . Hypertension      History of Present Illness: [name] is a 69 y.o. female who presents for ongoing assessment and management of systolic heart failure with EF of 45%, hypertension, which is difficult to control.She was last seen in the office on 11/13/2014 due to hypotension noted when she saw Dr. Juanetta [name] associated with syncope while sitting. A cardiac monitor was placed to evaluate for arrhythmia's causing her symptoms and an echo was ordered.   11/20/2014 Echo Left ventricle: The cavity size was normal. There was moderate concentric hypertrophy. Systolic function was mildly to moderately reduced. The estimated ejection fraction was in the range of 40% to 45%. Diffusely hypokinetic. Doppler parameters are consistent with abnormal left ventricular relaxation (grade 1 diastolic dysfunction). Doppler parameters are consistent with high ventricular filling pressure. - Ventricular septum: Septal motion showed abnormal function and dyssynergy. These changes are consistent with a left bundle branch block. - Mitral valve: There was moderate regurgitation. - Tricuspid valve: There was mild regurgitation.  She refused cardiac monitor after office visit as she states that her insurance would not cover it.     Past Medical History  Diagnosis Date  . Essential hypertension   . Hyperlipidemia   . Hypothyroidism   . GERD (gastroesophageal reflux disease)   . Orthostatic hypotension   . Arthritis   . Current use of estrogen therapy 03/25/2013  . LBBB (left bundle branch block)   . H/O cardiac catheterization     2011: normal coronary arteries (false positive stress test).  [name] TIA (transient ischemic attack)     On Plavix     Past Surgical History  Procedure Laterality Date  . Partial hysterectomy    . Neck surgery      Secondary to ruptured disc  . Colonoscopy  12/25/2010    Procedure: COLONOSCOPY;  Surgeon: Laura Hippo, MD;  Location: [facility];  Service: Endoscopy;  Laterality: N/A;  . Abdominal hysterectomy    . Wrist fusion      Right- torn ligament  . Posterior cervical fusion/foraminotomy  12/18/2011    Procedure: POSTERIOR CERVICAL FUSION/FORAMINOTOMY LEVEL 2;  Surgeon: Laura Shorts, MD;  Location: [facility];  Service: Neurosurgery;  Laterality: Bilateral;  Cervical four to six Posterior cervical arthrodesis     Current Outpatient Prescriptions  Medication Sig Dispense Refill  . ALPRAZolam (XANAX) 0.25 MG tablet Take 1 tablet (0.25 mg total) by mouth 3 (three) times daily as needed for anxiety. 30 tablet 0  . calcium-vitamin D (OSCAL WITH D) 500-200 MG-UNIT per tablet Take 1 tablet by mouth at bedtime.     . carboxymethylcellulose (REFRESH PLUS) 0.5 % SOLN Place 1 drop into the right eye daily as needed (dry eye).    . carvedilol (COREG) 6.25 MG tablet Take 1 tablet (6.25 mg total) by mouth 2 (two) times daily. 60 tablet 6  . clopidogrel (PLAVIX) 75 MG tablet Take 1 tablet (75 mg total) by mouth daily. 30 tablet 12  . estradiol (VIVELLE-DOT) 0.05 MG/24HR patch APPLY 1 PATCH TWICE WEEKLY  12  . fludrocortisone (FLORINEF) 0.1 MG tablet Take 0.5 tablets (0.05 mg total) by mouth daily. 30 tablet 12  . levothyroxine (SYNTHROID, LEVOTHROID) 75 MCG tablet Take  75 mcg by mouth daily.      [name] losartan (COZAAR) 100 MG tablet Take 1 tablet (100 mg total) by mouth daily. 30 tablet 12  . multivitamin-iron-minerals-folic acid (CENTRUM) chewable tablet Chew 1 tablet by mouth at bedtime.     . pravastatin (PRAVACHOL) 20 MG tablet Take 1 tablet (20 mg total) by mouth daily. 30 tablet 12   No current facility-administered medications for this visit.    Allergies:   Codeine; Lisinopril; Other; and  Latex    Social History:  The patient  reports that she has never smoked. She has never used smokeless tobacco. She reports that she does not drink alcohol or use illicit drugs.   Family History:  The patient's family history includes Cancer in her brother; Colon cancer in her mother; Diabetes in her father; Lung cancer in her mother.    ROS: .   All other systems are reviewed and negative.Unless otherwise mentioned in H&P above.   PHYSICAL EXAM: VS:  There were no vitals taken for this visit. , BMI There is no weight on file to calculate BMI. GEN: Well nourished, well developed, in no acute distress HEENT: normal Neck: no JVD, carotid bruits, or masses Cardiac: RRR; no murmurs, rubs, or gallops,no edema  Respiratory:  clear to auscultation bilaterally, normal work of breathing GI: soft, nontender, nondistended, + BS MS: no deformity or atrophy Skin: warm and dry, no rash Neuro:  Strength and sensation are intact Psych: euthymic mood, full affect   Recent Labs: 02/05/2014: Hemoglobin 13.1; Platelets 222; Pro B Natriuretic peptide (BNP) 2140.0*; TSH 6.000* 02/14/2014: BUN 18; Creat 0.86; Potassium 3.8; Sodium 143    Lipid Panel    Component Value Date/Time   CHOL 140 02/06/2014 0616   TRIG 100 02/06/2014 0616   HDL 66 02/06/2014 0616   CHOLHDL 2.1 02/06/2014 0616   VLDL 20 02/06/2014 0616   LDLCALC 54 02/06/2014 0616      Wt Readings from Last 3 Encounters:  11/13/14 157 lb 3.2 oz (71.305 kg)  03/23/14 155 lb 6.4 oz (70.489 kg)  02/14/14 152 lb (68.947 kg)      ASSESSMENT AND PLAN:  1. Systolic Dysfunction: EF is reduced from prior echo and is now 40-45%. I have reviewed most recent cath in 2011 which demonstrated essentially normal coronaries and normal EF at that time, with some minor irregularities in the LAD. I suspect she had evidence of hypertensive heart disease. I have discussed the need to repeat cardiac cath with Dr. Purvis Sheffield, on site. He does not feel  that a repeat cardiac cath is warranted at this time.   2.Syncope:  I am still concerned about ventricular arrhythmia's in the setting of reduced EF. I have discussed the need to have her wear cardiac monitor to assess for this, as opposed to loop recorder. We will check with her insurance company to evaluate what is needed to have this covered to wear monitor.  3. Hypertension: I will discontinue florinef to assess if this will help with hypertension. She is very elevated today and on recheck in theoffice. She is also quite anxious. I have reviewed her BP at home and have found them to be very labile with averages in the 140's with big dips and large increases. She will have follow up appt in a couple of weeks to evaluate her BP and monitor results if she is able to wear this. She will see her primary cardiologist Dr. Diona Browner on next visit.    Current  medicines are reviewed at length with the patient today.    Labs/ tests ordered today include: None No orders of the defined types were placed in this encounter.     Disposition:   FU with 2-3 weeks.  Signed, Joni Reining, NP  12/14/2014 10:51 AM    [facility] [address], [place], [place] [address] Phone: [phone]; Fax: [phone]

## 2014-12-14 NOTE — Progress Notes (Signed)
Name: [name]    DOB: [birth year]  Age: 69 y.o.  MR#: [id number]       PCP:  Fredirick Maudlin, MD      Insurance: Payor: BLUE CROSS BLUE SHIELD / Plan: BCBS OTHER / Product Type: *No Product type* /   CC:    Chief Complaint  Patient presents with  . Congestive Heart Failure  . Hypertension    VS Filed Vitals:   12/14/14 1353  BP: 204/84  Pulse: 68  Height:  (1.651 m)  Weight: 159 lb (72.122 kg)  SpO2: 98%    Weights Current Weight  12/14/14 159 lb (72.122 kg)  11/13/14 157 lb 3.2 oz (71.305 kg)  03/23/14 155 lb 6.4 oz (70.489 kg)    Blood Pressure  BP Readings from Last 3 Encounters:  12/14/14 204/84  11/13/14 132/60  03/23/14 172/78     Admit date:  (Not on file) Last encounter with RMR:  11/13/2014   Allergy Codeine; Lisinopril; Other; and Latex  Current Outpatient Prescriptions  Medication Sig Dispense Refill  . ALPRAZolam (XANAX) 0.25 MG tablet Take 1 tablet (0.25 mg total) by mouth 3 (three) times daily as needed for anxiety. 30 tablet 0  . calcium-vitamin D (OSCAL WITH D) 500-200 MG-UNIT per tablet Take 1 tablet by mouth at bedtime.     . carboxymethylcellulose (REFRESH PLUS) 0.5 % SOLN Place 1 drop into the right eye daily as needed (dry eye).    . carvedilol (COREG) 6.25 MG tablet Take 1 tablet (6.25 mg total) by mouth 2 (two) times daily. 60 tablet 6  . clopidogrel (PLAVIX) 75 MG tablet Take 1 tablet (75 mg total) by mouth daily. 30 tablet 12  . estradiol (VIVELLE-DOT) 0.05 MG/24HR patch APPLY 1 PATCH TWICE WEEKLY  12  . fludrocortisone (FLORINEF) 0.1 MG tablet Take 0.5 tablets (0.05 mg total) by mouth daily. 30 tablet 12  . levothyroxine (SYNTHROID, LEVOTHROID) 75 MCG tablet Take 75 mcg by mouth daily.      Marland Kitchen losartan (COZAAR) 100 MG tablet Take 1 tablet (100 mg total) by mouth daily. 30 tablet 12  . multivitamin-iron-minerals-folic acid (CENTRUM) chewable tablet Chew 1 tablet by mouth at bedtime.     . pravastatin (PRAVACHOL) 20 MG tablet Take 1  tablet (20 mg total) by mouth daily. 30 tablet 12   No current facility-administered medications for this visit.    Discontinued Meds:   There are no discontinued medications.  Patient Active Problem List   Diagnosis Date Noted  . Acute on chronic diastolic CHF (congestive heart failure), NYHA class 1 02/06/2014  . Hypertensive urgency 02/06/2014  . LBBB (left bundle branch block) 12/09/2013  . Hypothyroidism 12/09/2013  . Syncope 12/08/2013  . Orthostatic hypotension 12/07/2013  . TIA (transient ischemic attack) 12/05/2013  . Current use of estrogen therapy 03/25/2013  . Nonspecific abnormal electrocardiogram (ECG) (EKG) 01/03/2011  . HYPERLIPIDEMIA-MIXED 05/08/2010  . HYPERTENSION, BENIGN 05/08/2010  . OSTEOARTHROSIS UNSPEC WHETHER GEN/LOCALIZED HAND 01/24/2010    LABS    Component Value Date/Time   NA 143 02/14/2014 1421   NA 145 02/07/2014 0558   NA 146 02/05/2014 2235   K 3.8 02/14/2014 1421   K 3.5* 02/07/2014 0558   K 3.7 02/05/2014 2235   CL 108 02/14/2014 1421   CL 105 02/07/2014 0558   CL 107 02/05/2014 2235   CO2 28 02/14/2014 1421   CO2 28 02/07/2014 0558   CO2 29 02/05/2014 2235   GLUCOSE 94 02/14/2014 1421  GLUCOSE 97 02/07/2014 0558   GLUCOSE 100* 02/05/2014 2235   BUN 18 02/14/2014 1421   BUN 19 02/07/2014 0558   BUN 18 02/05/2014 2235   CREATININE 0.86 02/14/2014 1421   CREATININE 0.76 02/07/2014 0558   CREATININE 0.78 02/05/2014 2235   CREATININE 0.87 12/08/2013 0917   CALCIUM 9.0 02/14/2014 1421   CALCIUM 9.3 02/07/2014 0558   CALCIUM 9.5 02/05/2014 2235   GFRNONAA 71 02/14/2014 1421   GFRNONAA 86* 02/07/2014 0558   GFRNONAA 85* 02/05/2014 2235   GFRNONAA 68* 12/08/2013 0917   GFRAA 81 02/14/2014 1421   GFRAA >90 02/07/2014 0558   GFRAA >90 02/05/2014 2235   GFRAA 79* 12/08/2013 0917   CMP     Component Value Date/Time   NA 143 02/14/2014 1421   K 3.8 02/14/2014 1421   CL 108 02/14/2014 1421   CO2 28 02/14/2014 1421   GLUCOSE 94  02/14/2014 1421   BUN 18 02/14/2014 1421   CREATININE 0.86 02/14/2014 1421   CREATININE 0.76 02/07/2014 0558   CALCIUM 9.0 02/14/2014 1421   PROT 7.4 12/08/2013 0917   ALBUMIN 4.2 12/08/2013 0917   AST 23 12/08/2013 0917   ALT 15 12/08/2013 0917   ALKPHOS 93 12/08/2013 0917   BILITOT 0.5 12/08/2013 0917   GFRNONAA 71 02/14/2014 1421   GFRNONAA 86* 02/07/2014 0558   GFRAA 81 02/14/2014 1421   GFRAA >90 02/07/2014 0558       Component Value Date/Time   WBC 5.9 02/05/2014 2235   WBC 4.9 12/08/2013 0917   WBC 6.0 12/05/2013 1813   HGB 13.1 02/05/2014 2235   HGB 14.1 12/08/2013 0917   HGB 13.5 12/05/2013 1813   HCT 38.0 02/05/2014 2235   HCT 39.9 12/08/2013 0917   HCT 38.6 12/05/2013 1813   MCV 90.0 02/05/2014 2235   MCV 87.9 12/08/2013 0917   MCV 89.1 12/05/2013 1813    Lipid Panel     Component Value Date/Time   CHOL 140 02/06/2014 0616   TRIG 100 02/06/2014 0616   HDL 66 02/06/2014 0616   CHOLHDL 2.1 02/06/2014 0616   VLDL 20 02/06/2014 0616   LDLCALC 54 02/06/2014 0616    ABG No results found for: PHART, PCO2ART, PO2ART, HCO3, TCO2, ACIDBASEDEF, O2SAT   Lab Results  Component Value Date   TSH 6.000* 02/05/2014   BNP (last 3 results) No results for input(s): BNP in the last 8760 hours.  ProBNP (last 3 results)  Recent Labs  02/05/14 2235  PROBNP 2140.0*    Cardiac Panel (last 3 results) No results for input(s): CKTOTAL, CKMB, TROPONINI, RELINDX in the last 72 hours.  Iron/TIBC/Ferritin/ %Sat No results found for: IRON, TIBC, FERRITIN, IRONPCTSAT   EKG Orders placed or performed in visit on 02/14/14  . AMB BP MONITORING     Prior Assessment and Plan Problem List as of 12/14/2014      Cardiovascular and Mediastinum   HYPERTENSION, BENIGN   Last Assessment & Plan 03/23/2014 Office Visit Written 03/23/2014  2:23 PM by Jodelle Gross, NP    BP is a little elevated today, but I rechecked it with BP 158/70 after having her rest and talk. She is  complaint with carvedilol. Will continue current medication regimen and see her in 6 months unless symptomatic.       LBBB (left bundle branch block)   TIA (transient ischemic attack)   Last Assessment & Plan 02/14/2014 Office Visit Written 02/14/2014  1:38 PM by Jodelle Gross, NP  Continues on Plavix. No further symptoms.       Orthostatic hypotension   Last Assessment & Plan 03/23/2014 Office Visit Written 03/23/2014  2:24 PM by Jodelle Gross, NP    She is no longer symptomatic with this on low dose Florinef.  Will continue this. No other recommendations at this time.       Syncope   Last Assessment & Plan 02/14/2014 Office Visit Written 02/14/2014  1:38 PM by Jodelle Gross, NP    Thought to be related to orthostatic BP. She remains on florinef at lower dose and has not had any further symptoms. Close follow up in one month for evaluation of her BP on increased dose of coreg and review of ambulatory BP recordings.       Acute on chronic diastolic CHF (congestive heart failure), NYHA class 1   Last Assessment & Plan 03/23/2014 Office Visit Written 03/23/2014  2:25 PM by Jodelle Gross, NP    No evidence of decompensation on examination. No changes in regimen. She is advised on low sodium diet.       Hypertensive urgency     Endocrine   Hypothyroidism     Musculoskeletal and Integument   OSTEOARTHROSIS UNSPEC WHETHER GEN/LOCALIZED HAND     Other   HYPERLIPIDEMIA-MIXED   Last Assessment & Plan 07/03/2010 Office Visit Written 07/03/2010  1:01 PM by Dyann Kief, PA    stable      Nonspecific abnormal electrocardiogram (ECG) (EKG)   Last Assessment & Plan 01/16/2011 Office Visit Written 01/16/2011  3:45 PM by Jonelle Sidle, MD    IVCD and LBBB documented, asymptomatic and associated with normal LVEF and previously documented normal coronaries. No further workup at this time. Can followup as needed.      Current use of estrogen therapy       Imaging: No  results found.

## 2014-12-14 NOTE — Patient Instructions (Addendum)
Your physician recommends that you schedule a follow-up appointment with 1 month Dr. Diona Browner  Your physician has recommended you make the following change in your medication:  STOP TAKING  FLORINEF  Your physician has recommended that you wear an event monitor. Event monitors are medical devices that record the heart's electrical activity. Doctors most often [place] these monitors to diagnose arrhythmias. Arrhythmias are problems with the speed or rhythm of the heartbeat. The monitor is a small, portable device. You can wear one while you do your normal daily activities. This is usually used to diagnose what is causing palpitations/syncope (passing out).  Thank you for choosing [facility]!

## 2015-01-15 ENCOUNTER — Ambulatory Visit: Payer: Self-pay | Admitting: Adult Health

## 2015-01-26 ENCOUNTER — Encounter: Payer: Self-pay | Admitting: Cardiovascular Disease

## 2015-01-26 ENCOUNTER — Ambulatory Visit (INDEPENDENT_AMBULATORY_CARE_PROVIDER_SITE_OTHER): Payer: BLUE CROSS/BLUE SHIELD | Admitting: Cardiovascular Disease

## 2015-01-26 VITALS — BP 160/64 | HR 72 | Ht 65.0 in | Wt 156.0 lb

## 2015-01-26 DIAGNOSIS — I10 Essential (primary) hypertension: Secondary | ICD-10-CM | POA: Diagnosis not present

## 2015-01-26 DIAGNOSIS — I447 Left bundle-branch block, unspecified: Secondary | ICD-10-CM | POA: Diagnosis not present

## 2015-01-26 NOTE — Assessment & Plan Note (Signed)
History of hypertension with blood pressure measured at 198/90. She is on carvedilol and losartan continue current meds at current dosing. I'm concerned that further adjusting her antihypertensives medications may further exacerbate her orthostatic changes.

## 2015-01-26 NOTE — Progress Notes (Signed)
01/26/2015 [name] [facility]   1945/08/29  [id number]  Primary Physician Laura Maudlin, MD Primary Cardiologist: Laura Gess MD [name]   HPI:  [name] is a 69 year old married Caucasian female whose husband [name] was a patient of mine remotely. I apparently performed catheterization of her in 2011 revealing normal coronary arteries after false positive stress test. She has a history of difficult to control hypertension, orthostatic hypotension, hype her lipidemia and left bundle branch block. Recent echo has shown a decline in her EF from the 55% range down to the 40-45% range with moderate global hypokinesia for unclear reasons. She's had several episodes of syncope which she describes as orthostatic in nature. She saw Laura Mech  NP along with [name] in [place].An event monitor was ordered.   Current Outpatient Prescriptions  Medication Sig Dispense Refill  . ALPRAZolam (XANAX) 0.25 MG tablet Take 1 tablet (0.25 mg total) by mouth 3 (three) times daily as needed for anxiety. 30 tablet 0  . calcium-vitamin D (OSCAL WITH D) 500-200 MG-UNIT per tablet Take 1 tablet by mouth at bedtime.     . carboxymethylcellulose (REFRESH PLUS) 0.5 % SOLN Place 1 drop into the right eye daily as needed (dry eye).    . carvedilol (COREG) 6.25 MG tablet Take 1 tablet (6.25 mg total) by mouth 2 (two) times daily. 60 tablet 6  . clopidogrel (PLAVIX) 75 MG tablet Take 1 tablet (75 mg total) by mouth daily. 30 tablet 12  . estradiol (VIVELLE-DOT) 0.05 MG/24HR patch APPLY 1 PATCH TWICE WEEKLY  12  . levothyroxine (SYNTHROID, LEVOTHROID) 75 MCG tablet Take 75 mcg by mouth daily.      Marland Kitchen losartan (COZAAR) 100 MG tablet Take 1 tablet (100 mg total) by mouth daily. 30 tablet 12  . multivitamin-iron-minerals-folic acid (CENTRUM) chewable tablet Chew 1 tablet by mouth at bedtime.     . pravastatin (PRAVACHOL) 20 MG tablet Take 1 tablet (20 mg total) by mouth daily. 30  tablet 12  . traMADol (ULTRAM) 50 MG tablet Take 50 mg by mouth every 6 (six) hours as needed.     No current facility-administered medications for this visit.    Allergies  Allergen Reactions  . Codeine Nausea And Vomiting  . Lisinopril Cough  . Other Nausea And Vomiting    Most anesthesia   . Latex Rash    Social History   Social History  . Marital Status: Married    Spouse Name: N/A  . Number of Children: N/A  . Years of Education: N/A   Occupational History  . Not on file.   Social History Main Topics  . Smoking status: Never Smoker   . Smokeless tobacco: Never Used  . Alcohol Use: No  . Drug Use: No  . Sexual Activity: No   Other Topics Concern  . Not on file   Social History Narrative     Review of Systems: General: negative for chills, fever, night sweats or weight changes.  Cardiovascular: negative for chest pain, dyspnea on exertion, edema, orthopnea, palpitations, paroxysmal nocturnal dyspnea or shortness of breath Dermatological: negative for rash Respiratory: negative for cough or wheezing Urologic: negative for hematuria Abdominal: negative for nausea, vomiting, diarrhea, bright red blood per rectum, melena, or hematemesis Neurologic: negative for visual changes, syncope, or dizziness All other systems reviewed and are otherwise negative except as noted above.    Blood pressure 198/90, pulse 72, height  (1.651 m), weight 156 lb (70.761 kg).  General appearance: alert and no distress Neck: no adenopathy, no carotid bruit, no JVD, supple, symmetrical, trachea midline and thyroid not enlarged, symmetric, no tenderness/mass/nodules Lungs: clear to auscultation bilaterally Heart: regular rate and rhythm, S1, S2 normal, no murmur, click, rub or gallop Extremities: extremities normal, atraumatic, no cyanosis or edema  EKG not performed today  ASSESSMENT AND PLAN:   Syncope [name] had had a syncopal episode which she relates to orthostatic  hypotensive changes. These episodes happen in the morning. She saw [name] in [place] who assessed her and stop her spironolactone.2-D echo revealed an EF of 40-45% which is moderately reduced compared to a prior echo performed previous year. An event monitor was ordered.  LBBB (left bundle branch block) chronic  HYPERLIPIDEMIA-MIXED History of hyperlipidemia on pravastatin with her most recent lipid profile performed 02/06/14 revealed total cholesterol 140, LDL 54 and HDL of 56  HYPERTENSION, BENIGN History of hypertension with blood pressure measured at 198/90. She is on carvedilol and losartan continue current meds at current dosing. I'm concerned that further adjusting her antihypertensives medications may further exacerbate her orthostatic changes.      Laura Gess MD FACP,FACC,FAHA, [facility] 01/26/2015 4:36 PM

## 2015-01-26 NOTE — Patient Instructions (Signed)
Medication Instructions:  Your physician recommends that you continue on your current medications as directed. Please refer to the Current Medication list given to you today.   Labwork: none  Testing/Procedures: none  Follow-Up: Follow up with Dr. Berry as needed.   Any Other Special Instructions Will Be Listed Below (If Applicable).   

## 2015-01-26 NOTE — Assessment & Plan Note (Signed)
[name] had had a syncopal episode which she relates to orthostatic hypotensive changes. These episodes happen in the morning. She saw [name] in [place] who assessed her and stop her spironolactone.2-D echo revealed an EF of 40-45% which is moderately reduced compared to a prior echo performed previous year. An event monitor was ordered.

## 2015-01-26 NOTE — Assessment & Plan Note (Signed)
History of hyperlipidemia on pravastatin with her most recent lipid profile performed 02/06/14 revealed total cholesterol 140, LDL 54 and HDL of 56

## 2015-01-26 NOTE — Assessment & Plan Note (Signed)
chronic

## 2015-01-28 ENCOUNTER — Emergency Department (HOSPITAL_COMMUNITY): Payer: BLUE CROSS/BLUE SHIELD

## 2015-01-28 ENCOUNTER — Emergency Department (HOSPITAL_COMMUNITY)
Admission: EM | Admit: 2015-01-28 | Discharge: 2015-01-28 | Disposition: A | Discharge status: Home / Self Care | Payer: BLUE CROSS/BLUE SHIELD | Attending: Emergency Medicine | Admitting: Emergency Medicine

## 2015-01-28 ENCOUNTER — Encounter (HOSPITAL_COMMUNITY): Payer: Self-pay | Admitting: Emergency Medicine

## 2015-01-28 DIAGNOSIS — Z7902 Long term (current) use of antithrombotics/antiplatelets: Secondary | ICD-10-CM | POA: Insufficient documentation

## 2015-01-28 DIAGNOSIS — Z8673 Personal history of transient ischemic attack (TIA), and cerebral infarction without residual deficits: Secondary | ICD-10-CM | POA: Insufficient documentation

## 2015-01-28 DIAGNOSIS — N390 Urinary tract infection, site not specified: Secondary | ICD-10-CM | POA: Diagnosis not present

## 2015-01-28 DIAGNOSIS — Z9889 Other specified postprocedural states: Secondary | ICD-10-CM | POA: Insufficient documentation

## 2015-01-28 DIAGNOSIS — R509 Fever, unspecified: Secondary | ICD-10-CM | POA: Diagnosis present

## 2015-01-28 DIAGNOSIS — Z91040 Latex allergy status: Secondary | ICD-10-CM | POA: Diagnosis not present

## 2015-01-28 DIAGNOSIS — E039 Hypothyroidism, unspecified: Secondary | ICD-10-CM | POA: Diagnosis not present

## 2015-01-28 DIAGNOSIS — I10 Essential (primary) hypertension: Secondary | ICD-10-CM | POA: Diagnosis not present

## 2015-01-28 DIAGNOSIS — Z8719 Personal history of other diseases of the digestive system: Secondary | ICD-10-CM | POA: Diagnosis not present

## 2015-01-28 DIAGNOSIS — M1990 Unspecified osteoarthritis, unspecified site: Secondary | ICD-10-CM | POA: Diagnosis not present

## 2015-01-28 DIAGNOSIS — R5383 Other fatigue: Secondary | ICD-10-CM | POA: Diagnosis not present

## 2015-01-28 DIAGNOSIS — M791 Myalgia: Secondary | ICD-10-CM | POA: Diagnosis not present

## 2015-01-28 DIAGNOSIS — E785 Hyperlipidemia, unspecified: Secondary | ICD-10-CM | POA: Insufficient documentation

## 2015-01-28 DIAGNOSIS — R6883 Chills (without fever): Secondary | ICD-10-CM | POA: Insufficient documentation

## 2015-01-28 DIAGNOSIS — Z79899 Other long term (current) drug therapy: Secondary | ICD-10-CM | POA: Diagnosis not present

## 2015-01-28 LAB — URINALYSIS, ROUTINE W REFLEX MICROSCOPIC
Bilirubin Urine: NEGATIVE
Glucose, UA: NEGATIVE mg/dL
NITRITE: POSITIVE — AB
PH: 6.5 (ref 5.0–8.0)
PROTEIN: 30 mg/dL — AB
Specific Gravity, Urine: 1.015 (ref 1.005–1.030)
Urobilinogen, UA: >8 mg/dL — ABNORMAL HIGH (ref 0.0–1.0)

## 2015-01-28 LAB — CBC
HCT: 35.8 % — ABNORMAL LOW (ref 36.0–46.0)
Hemoglobin: 12.4 g/dL (ref 12.0–15.0)
MCH: 31.6 pg (ref 26.0–34.0)
MCHC: 34.6 g/dL (ref 30.0–36.0)
MCV: 91.1 fL (ref 78.0–100.0)
PLATELETS: 165 10*3/uL (ref 150–400)
RBC: 3.93 MIL/uL (ref 3.87–5.11)
RDW: 12.6 % (ref 11.5–15.5)
WBC: 10.8 10*3/uL — AB (ref 4.0–10.5)

## 2015-01-28 LAB — COMPREHENSIVE METABOLIC PANEL
ALBUMIN: 3.8 g/dL (ref 3.5–5.0)
ALK PHOS: 78 U/L (ref 38–126)
ALT: 19 U/L (ref 14–54)
AST: 26 U/L (ref 15–41)
Anion gap: 8 (ref 5–15)
BILIRUBIN TOTAL: 1 mg/dL (ref 0.3–1.2)
BUN: 16 mg/dL (ref 6–20)
CO2: 24 mmol/L (ref 22–32)
Calcium: 8.3 mg/dL — ABNORMAL LOW (ref 8.9–10.3)
Chloride: 102 mmol/L (ref 101–111)
Creatinine, Ser: 0.84 mg/dL (ref 0.44–1.00)
GFR calc non Af Amer: >60 mL/min (ref >60)
GLUCOSE: 126 mg/dL — AB (ref 65–99)
POTASSIUM: 3.8 mmol/L (ref 3.5–5.1)
SODIUM: 134 mmol/L — AB (ref 135–145)
TOTAL PROTEIN: 7.1 g/dL (ref 6.5–8.1)

## 2015-01-28 LAB — URINE MICROSCOPIC-ADD ON

## 2015-01-28 LAB — LACTIC ACID, PLASMA: Lactic Acid, Venous: 0.9 mmol/L (ref 0.5–2.0)

## 2015-01-28 MED ORDER — LEVOFLOXACIN IN D5W 750 MG/150ML IV SOLN
750.0000 mg | Freq: Once | INTRAVENOUS | Status: AC
  Administered 2015-01-28: 750 mg via INTRAVENOUS
  Filled 2015-01-28: qty 150

## 2015-01-28 MED ORDER — SODIUM CHLORIDE 0.9 % IV SOLN
Freq: Once | INTRAVENOUS | Status: AC
  Administered 2015-01-28: 15:00:00 via INTRAVENOUS

## 2015-01-28 MED ORDER — ACETAMINOPHEN 500 MG PO TABS
1000.0000 mg | ORAL_TABLET | Freq: Once | ORAL | Status: AC
  Administered 2015-01-28: 1000 mg via ORAL
  Filled 2015-01-28: qty 2

## 2015-01-28 MED ORDER — SODIUM CHLORIDE 0.9 % IV BOLUS (SEPSIS)
1000.0000 mL | Freq: Once | INTRAVENOUS | Status: AC
  Administered 2015-01-28: 1000 mL via INTRAVENOUS

## 2015-01-28 MED ORDER — LEVOFLOXACIN 500 MG PO TABS: 500.0000 mg | ORAL_TABLET | Freq: Every day | ORAL | Status: DC

## 2015-01-28 NOTE — ED Notes (Signed)
Pt states that she has been running a low grade fever for the past few days.  Has not wanted to eat or drink.  Sister has also been sick.

## 2015-01-28 NOTE — ED Provider Notes (Signed)
CSN: [id number]     Arrival date & time 01/28/15  1133 History   First MD Initiated Contact with Patient 01/28/15 1248     Chief Complaint  Patient presents with  . Fever     (Consider location/radiation/quality/duration/timing/severity/associated sxs/prior Treatment) HPI Comments: 69 y.o. Female with history of HTN, hyperlipidemia, hypothyroidism, LBBB presents for low grade fever and body aches.  The patient reports that her sister has had similar symptoms.  She denies cough, runny nose, headache, shortness of breath, vomiting, diarrhea.  She has had some burning with urination and urinary frequency.     Past Medical History  Diagnosis Date  . Essential hypertension   . Hyperlipidemia   . Hypothyroidism   . GERD (gastroesophageal reflux disease)   . Orthostatic hypotension     associated with syncope  . Arthritis   . Current use of estrogen therapy 03/25/2013  . LBBB (left bundle branch block)   . H/O cardiac catheterization     2011: normal coronary arteries (false positive stress test).  Marland Kitchen TIA (transient ischemic attack)     On Plavix  . Nonischemic cardiomyopathy [facility])    Past Surgical History  Procedure Laterality Date  . Partial hysterectomy    . Neck surgery      Secondary to ruptured disc  . Colonoscopy  12/25/2010    Procedure: COLONOSCOPY;  Surgeon: Malissa Hippo, MD;  Location: [facility];  Service: Endoscopy;  Laterality: N/A;  . Abdominal hysterectomy    . Wrist fusion      Right- torn ligament  . Posterior cervical fusion/foraminotomy  12/18/2011    Procedure: POSTERIOR CERVICAL FUSION/FORAMINOTOMY LEVEL 2;  Surgeon: Hewitt Shorts, MD;  Location: [facility];  Service: Neurosurgery;  Laterality: Bilateral;  Cervical four to six Posterior cervical arthrodesis   Family History  Problem Relation Age of Onset  . Colon cancer Mother   . Lung cancer Mother   . Diabetes Father   . Cancer Brother     Back   Social History  Substance Use Topics  .  Smoking status: Never Smoker   . Smokeless tobacco: Never Used  . Alcohol Use: No   OB History    Gravida Para Term Preterm AB TAB SAB Ectopic Multiple Living   Review of Systems  Constitutional: Positive for fever, chills, appetite change and fatigue.  HENT: Negative for congestion, postnasal drip and rhinorrhea.   Eyes: Negative for pain and redness.  Respiratory: Negative for cough, chest tightness and shortness of breath.   Cardiovascular: Negative for chest pain and palpitations.  Gastrointestinal: Negative for nausea, vomiting, abdominal pain and diarrhea.  Genitourinary: Positive for dysuria and frequency. Negative for urgency and flank pain.  Musculoskeletal: Positive for myalgias. Negative for back pain and joint swelling.  Skin: Negative for rash.  Neurological: Negative for dizziness, weakness, light-headedness and numbness.  Hematological: Does not bruise/bleed easily.      Allergies  Codeine; Lisinopril; Other; and Latex  Home Medications   Prior to Admission medications   Medication Sig Start Date End Date Taking? Authorizing Provider  acetaminophen (TYLENOL) 500 MG tablet Take 500 mg by mouth every 6 (six) hours as needed for headache.   Yes Historical Provider, MD  ALPRAZolam (XANAX) 0.25 MG tablet Take 1 tablet (0.25 mg total) by mouth 3 (three) times daily as needed for anxiety. 12/13/13  Yes Kari Baars, MD  calcium-vitamin D Ruthell Rummage WITH  D) 500-200 MG-UNIT per tablet Take 1 tablet by mouth at bedtime.    Yes Historical Provider, MD  carboxymethylcellulose (REFRESH PLUS) 0.5 % SOLN Place 1 drop into the right eye daily as needed (dry eye).   Yes Historical Provider, MD  carvedilol (COREG) 6.25 MG tablet Take 1 tablet (6.25 mg total) by mouth 2 (two) times daily. 02/14/14  Yes Jodelle Gross, NP  clopidogrel (PLAVIX) 75 MG tablet Take 1 tablet (75 mg total) by mouth daily. 02/07/14  Yes Kari Baars, MD  estradiol (VIVELLE-DOT) 0.05  MG/24HR patch APPLY 1 PATCH TWICE WEEKLY 10/29/14  Yes Historical Provider, MD  levothyroxine (SYNTHROID, LEVOTHROID) 75 MCG tablet Take 75 mcg by mouth daily.     Yes Historical Provider, MD  losartan (COZAAR) 100 MG tablet Take 1 tablet (100 mg total) by mouth daily. 02/07/14  Yes Kari Baars, MD  multivitamin-iron-minerals-folic acid (CENTRUM) chewable tablet Chew 1 tablet by mouth at bedtime.    Yes Historical Provider, MD  pravastatin (PRAVACHOL) 20 MG tablet Take 1 tablet (20 mg total) by mouth daily. 12/07/13  Yes Kari Baars, MD  traMADol (ULTRAM) 50 MG tablet Take 50 mg by mouth every 6 (six) hours as needed for moderate pain.    Yes Historical Provider, MD   BP 148/56 mmHg  Pulse 83  Temp(Src) 98.5 F (36.9 C) (Oral)  Resp 14  Ht 5\' 5"  (1.651 m)  Wt 155 lb (70.308 kg)  BMI 25.79 kg/m2  SpO2 96% Physical Exam  Constitutional: She is oriented to person, place, and time. She appears well-developed and well-nourished. She appears ill. No distress.  HENT:  Head: Normocephalic and atraumatic.  Right Ear: External ear normal.  Left Ear: External ear normal.  Nose: Nose normal.  Mouth/Throat: Oropharynx is clear and moist. No oropharyngeal exudate.  Eyes: EOM are normal. Pupils are equal, round, and reactive to light.  Neck: Normal range of motion. Neck supple.  Cardiovascular: Normal rate, regular rhythm, normal heart sounds and intact distal pulses.   No murmur heard. Pulmonary/Chest: Effort normal. No respiratory distress. She has no wheezes. She has no rales.  Abdominal: Soft. She exhibits no distension. There is no tenderness.  Musculoskeletal: Normal range of motion. She exhibits no edema or tenderness.  Neurological: She is alert and oriented to person, place, and time.  Skin: Skin is warm and dry. No rash noted. She is not diaphoretic.  Vitals reviewed.   ED Course  Procedures (including critical care time) Labs Review Labs Reviewed  COMPREHENSIVE METABOLIC PANEL  - Abnormal; Notable for the following:    Sodium 134 (*)    Glucose, Bld 126 (*)    Calcium 8.3 (*)    All other components within normal limits  URINALYSIS, ROUTINE W REFLEX MICROSCOPIC (NOT AT [facility]) - Abnormal; Notable for the following:    APPearance CLOUDY (*)    Hgb urine dipstick MODERATE (*)    Ketones, ur TRACE (*)    Protein, ur 30 (*)    Urobilinogen, UA >8.0 (*)    Nitrite POSITIVE (*)    Leukocytes, UA LARGE (*)    All other components within normal limits  CBC - Abnormal; Notable for the following:    WBC 10.8 (*)    HCT 35.8 (*)    All other components within normal limits  URINE MICROSCOPIC-ADD ON - Abnormal; Notable for the following:    Squamous Epithelial / LPF MANY (*)    Bacteria, UA MANY (*)    All other  components within normal limits  URINE CULTURE  CULTURE, BLOOD (ROUTINE X 2)  CULTURE, BLOOD (ROUTINE X 2)  LACTIC ACID, PLASMA  LACTIC ACID, PLASMA    Imaging Review Dg Chest 2 View  01/28/2015   CLINICAL DATA:  Fever, weakness and loss of appetite, 3 days duration.  EXAM: CHEST  2 VIEW  COMPARISON:  02/05/2014  FINDINGS: Heart size is normal. Mediastinal shadows are normal. The lungs are clear except for mild scarring in the lower lungs. There is indistinctness of the right heart border. On the lateral view, there is mild patchy density in the right middle lobe. Minimal blunting of the right posterior costophrenic angle. No significant bony finding.  IMPRESSION: Resolution of previously seen pneumonia on the study of 02/05/2014. Patchy density in the right middle lobe and blunting of the right posterior costophrenic angle. These findings could represent scarring relative to the previous episode of pneumonia. However, this could certainly represent an acute small right middle lobe pneumonia.   Electronically Signed   By: Paulina Fusi M.D.   On: 01/28/2015 12:59   I have personally reviewed and evaluated these images and lab results as part of my medical  decision-making.   EKG Interpretation None      MDM  Patient seen and evaluated in stable condition.  Non toxic appearing but did clinically improve with IV fluids and tylenol.  Laboratory results unremarkable other than UA consistent with infection.  Patient given a dose of levaquin.  Discussed with patient and family results.  Patient stated she felt well enough for discharge.  She was discharged home in stable condition with a prescription for levaquin and instruction to follow up with her PCP. Final diagnoses:  None   1. UTI    Leta Baptist, MD 01/28/15 2152

## 2015-01-28 NOTE — Discharge Instructions (Signed)
Urinary Tract Infection  Your urinary tract infection is what is causing you to not feel well.  There is also a small area on your chest xray that looks like it could be a very small, early pneumonia but likely just change in the lung secondary to your lung from your pneumonia last year.  Take your antibiotics as prescribed and use tylenol at home.  Follow up with your primary care physician in a few days to make sure you are improving.  Return immediately with worsening of symptoms, if you cannot tolerate your medications.  Urinary tract infections (UTIs) can develop anywhere along your urinary tract. Your urinary tract is your body's drainage system for removing wastes and extra water. Your urinary tract includes two kidneys, two ureters, a bladder, and a urethra. Your kidneys are a pair of bean-shaped organs. Each kidney is about the size of your fist. They are located below your ribs, one on each side of your spine. CAUSES Infections are caused by microbes, which are microscopic organisms, including fungi, viruses, and bacteria. These organisms are so small that they can only be seen through a microscope. Bacteria are the microbes that most commonly cause UTIs. SYMPTOMS  Symptoms of UTIs may vary by age and gender of the patient and by the location of the infection. Symptoms in young women typically include a frequent and intense urge to urinate and a painful, burning feeling in the bladder or urethra during urination. Older women and men are more likely to be tired, shaky, and weak and have muscle aches and abdominal pain. A fever may mean the infection is in your kidneys. Other symptoms of a kidney infection include pain in your back or sides below the ribs, nausea, and vomiting. DIAGNOSIS To diagnose a UTI, your caregiver will ask you about your symptoms. Your caregiver will also ask you to provide a urine sample. The urine sample will be tested for bacteria and white blood cells. White blood cells  are made by your body to help fight infection. TREATMENT  Typically, UTIs can be treated with medication. Because most UTIs are caused by a bacterial infection, they usually can be treated with the use of antibiotics. The choice of antibiotic and length of treatment depend on your symptoms and the type of bacteria causing your infection. HOME CARE INSTRUCTIONS  If you were prescribed antibiotics, take them exactly as your caregiver instructs you. Finish the medication even if you feel better after you have only taken some of the medication.  Drink enough water and fluids to keep your urine clear or pale yellow.  Avoid caffeine, tea, and carbonated beverages. They tend to irritate your bladder.  Empty your bladder often. Avoid holding urine for long periods of time.  Empty your bladder before and after sexual intercourse.  After a bowel movement, women should cleanse from front to back. Use each tissue only once. SEEK MEDICAL CARE IF:   You have back pain.  You develop a fever.  Your symptoms do not begin to resolve within 3 days. SEEK IMMEDIATE MEDICAL CARE IF:   You have severe back pain or lower abdominal pain.  You develop chills.  You have nausea or vomiting.  You have continued burning or discomfort with urination. MAKE SURE YOU:   Understand these instructions.  Will watch your condition.  Will get help right away if you are not doing well or get worse.   This information is not intended to replace advice given to you by your  health care provider. Make sure you discuss any questions you have with your health care provider.   Document Released: 01/15/2005 Document Revised: 12/27/2014 Document Reviewed: 05/16/2011 Elsevier Interactive Patient Education [employer]

## 2015-01-30 LAB — URINE CULTURE: Culture: >=100000

## 2015-01-31 ENCOUNTER — Telehealth (HOSPITAL_BASED_OUTPATIENT_CLINIC_OR_DEPARTMENT_OTHER): Payer: Self-pay | Admitting: Emergency Medicine

## 2015-01-31 NOTE — Telephone Encounter (Signed)
Post ED Visit - Positive Culture Follow-up  Culture report reviewed by antimicrobial stewardship pharmacist:  []  Celedonio MiyamotoJeremy Frens, Pharm.D., BCPS []  Georgina PillionElizabeth Martin, Pharm.D., BCPS []  [place] GeorgeMinh Pham, [address]Ph[address]D., BCPS, AAHIVP []  Estella HuskMichelle Turner, Pharm.D., BCPS, AAHIVP []  Colgate PalmoliveCristy Reyes, [address]Pharm[address]D. []  Tennis Mustassie Stewart, Pharm.D. Lisette GrinderAlyson Leonard PharmD  Positive urine culture Treated with levofloxacin, organism sensitive to the same and no further patient follow-up is required at this time.  Berle MullMiller, Elvin Mccartin 01/31/2015, 10:00 AM

## 2015-02-02 LAB — CULTURE, BLOOD (ROUTINE X 2)
CULTURE: NO GROWTH
CULTURE: NO GROWTH

## 2015-11-18 IMAGING — DX DG CHEST 2V
2 series · 2 of 2 positions shown · non-contrast
Comparison: 02/05/2014

CLINICAL DATA: Fever, weakness and loss of appetite, 3 days
duration.

EXAM:
CHEST  2 VIEW

[chest pa]
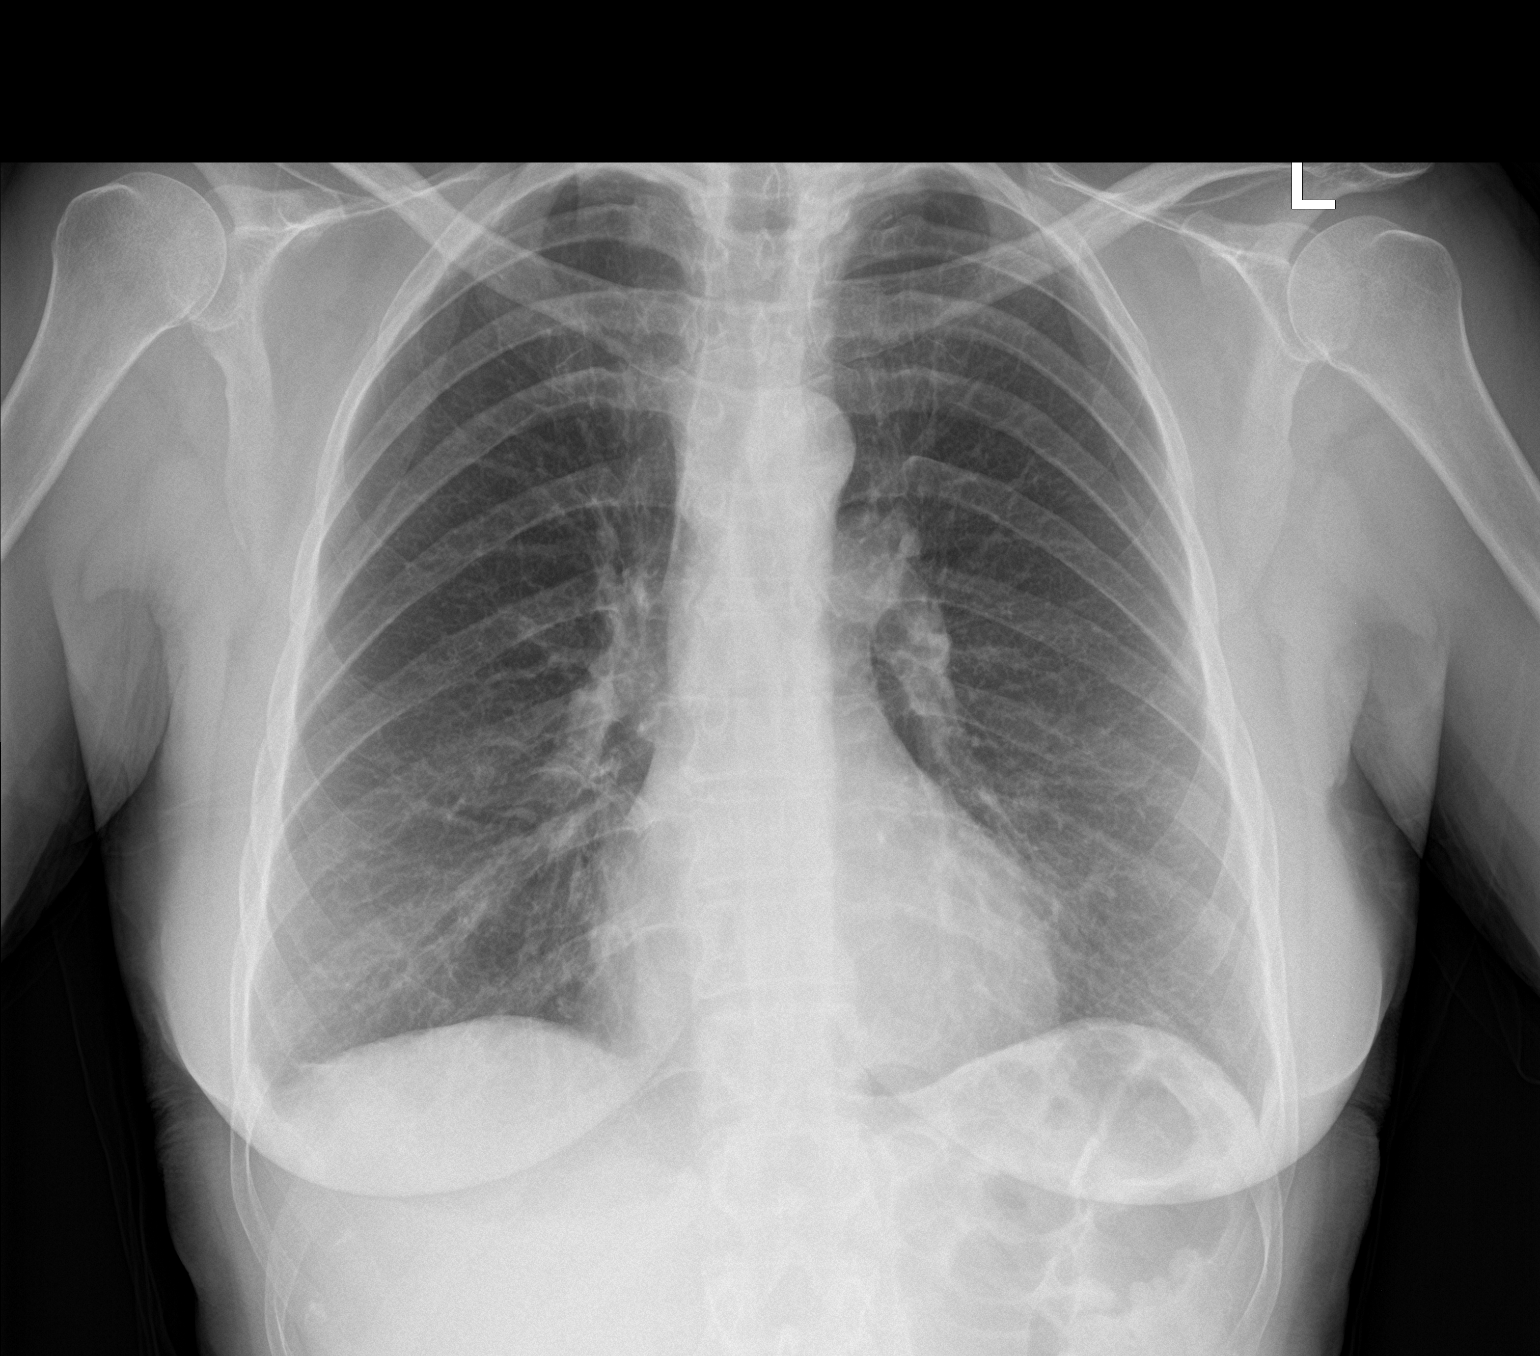

[chest lat]
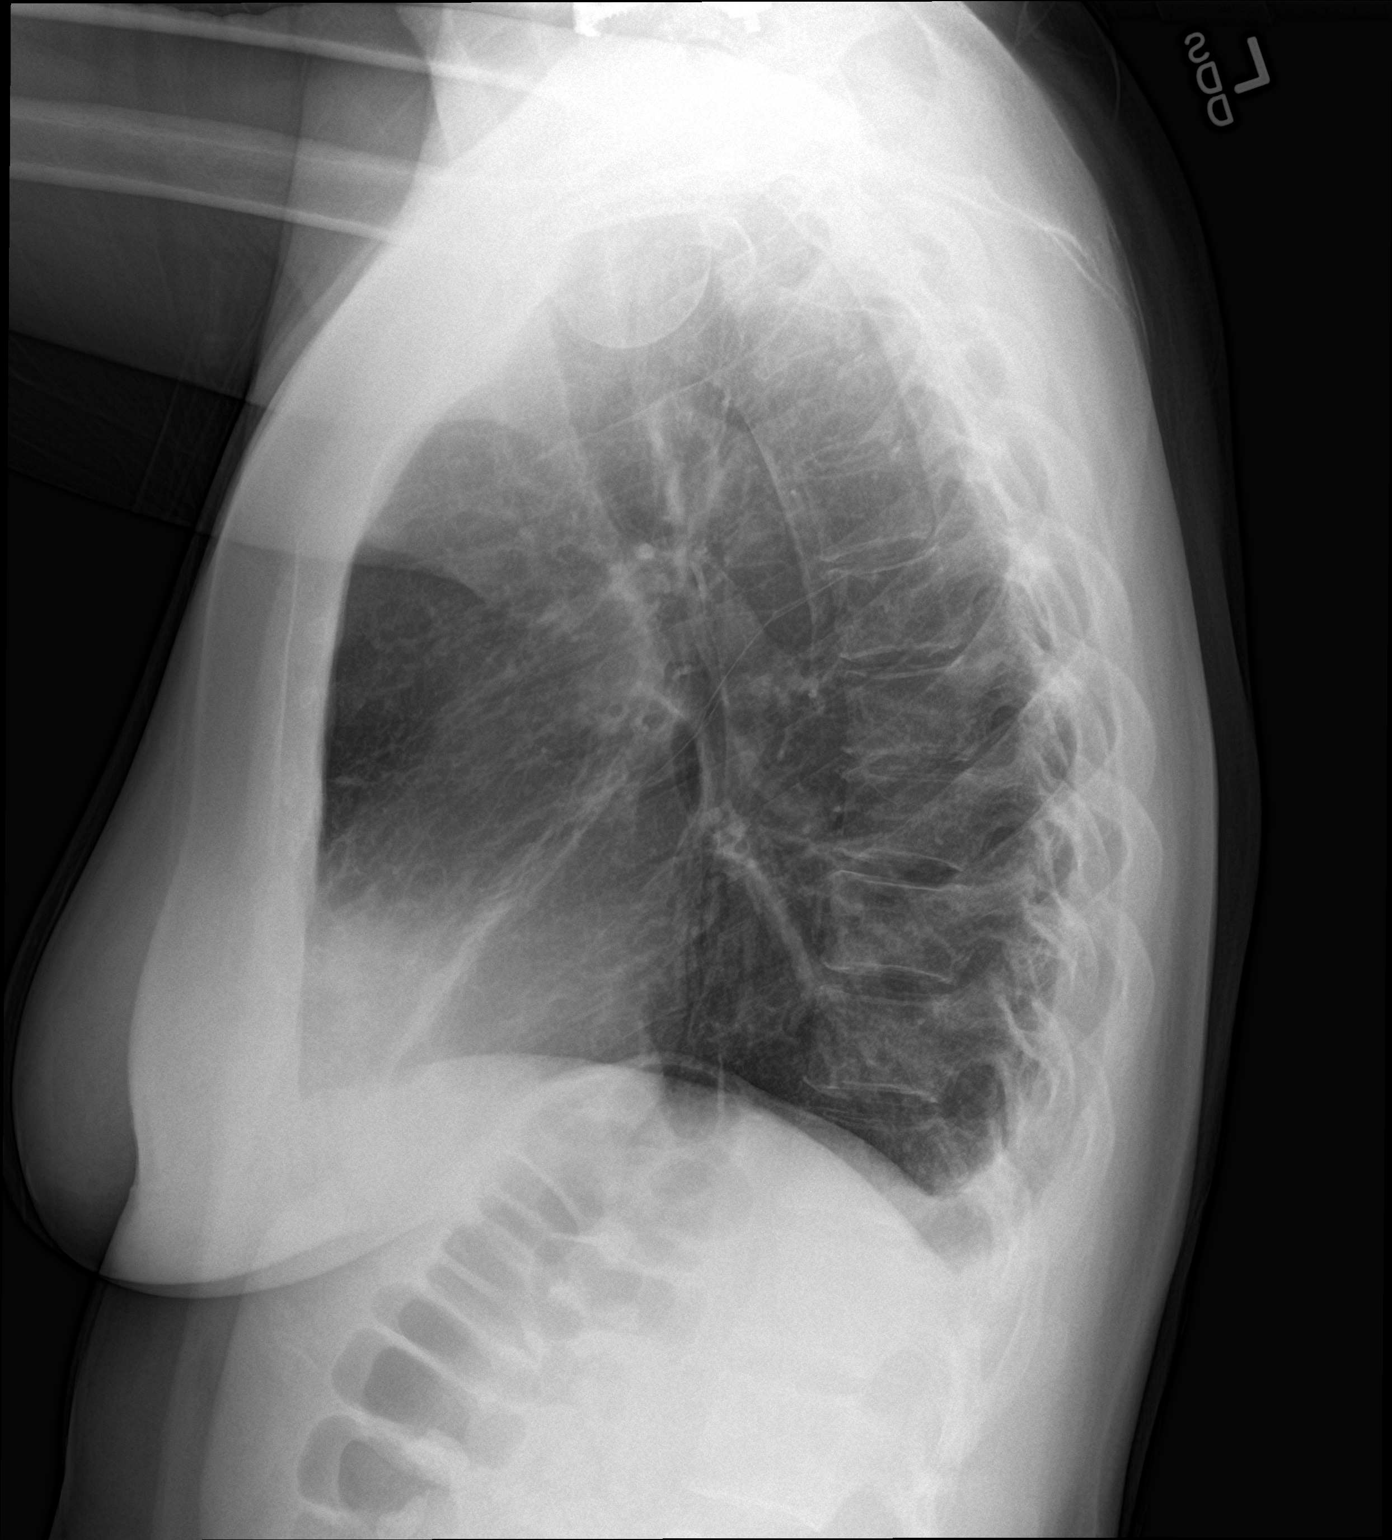

[2 of 2 positions shown; findings below may reference images not displayed]

FINDINGS: Heart size is normal. Mediastinal shadows are normal. The lungs are
clear except for mild scarring in the lower lungs. There is
indistinctness of the right heart border. On the lateral view, there
is mild patchy density in the right middle lobe. Minimal blunting of
the right posterior costophrenic angle. No significant bony finding.
IMPRESSION: Resolution of previously seen pneumonia on the study of 02/05/2014.
Patchy density in the right middle lobe and blunting of the right
posterior costophrenic angle. These findings could represent
scarring relative to the previous episode of pneumonia. However,
this could certainly represent an acute small right middle lobe
pneumonia.

## 2016-01-24 DIAGNOSIS — I10 Essential (primary) hypertension: Secondary | ICD-10-CM | POA: Diagnosis not present

## 2016-01-24 DIAGNOSIS — R269 Unspecified abnormalities of gait and mobility: Secondary | ICD-10-CM | POA: Diagnosis not present

## 2016-02-18 DIAGNOSIS — M5030 Other cervical disc degeneration, unspecified cervical region: Secondary | ICD-10-CM | POA: Diagnosis not present

## 2016-02-18 DIAGNOSIS — E785 Hyperlipidemia, unspecified: Secondary | ICD-10-CM | POA: Diagnosis not present

## 2016-02-18 DIAGNOSIS — I5020 Unspecified systolic (congestive) heart failure: Secondary | ICD-10-CM | POA: Diagnosis not present

## 2016-02-18 DIAGNOSIS — I10 Essential (primary) hypertension: Secondary | ICD-10-CM | POA: Diagnosis not present

## 2016-03-18 ENCOUNTER — Ambulatory Visit (INDEPENDENT_AMBULATORY_CARE_PROVIDER_SITE_OTHER): Payer: BLUE CROSS/BLUE SHIELD | Admitting: Orthopedic Surgery

## 2016-03-18 ENCOUNTER — Ambulatory Visit (INDEPENDENT_AMBULATORY_CARE_PROVIDER_SITE_OTHER): Payer: BLUE CROSS/BLUE SHIELD

## 2016-03-18 ENCOUNTER — Encounter: Payer: Self-pay | Admitting: Orthopedic Surgery

## 2016-03-18 VITALS — BP 192/89 | HR 72 | Wt 153.0 lb

## 2016-03-18 DIAGNOSIS — M25531 Pain in right wrist: Secondary | ICD-10-CM

## 2016-03-18 DIAGNOSIS — M654 Radial styloid tenosynovitis [de Quervain]: Secondary | ICD-10-CM | POA: Diagnosis not present

## 2016-03-18 NOTE — Patient Instructions (Addendum)
De Quervain Disease Introduction De Quervain disease is inflammation of the tendon on the thumb side of the wrist. Tendons are cords of tissue that connect bones to muscles. The tendons in your hand pass through a tunnel, or sheath. A slippery layer of tissue (synovium) lets the tendons move smoothly in the sheath. With de Quervain disease, the sheath swells or thickens, causing friction and pain. The condition is also called de Quervain tendinosis and de Quervain syndrome. It occurs most often in women who are 30-50 years old. What are the causes? The exact cause of de Quervain disease is not known. It may result from:  Overusing your hands, especially with repetitive motions that involve twisting your hand or using a forceful grip.  Pregnancy.  Rheumatoid disease. What increases the risk? You may have a greater risk for de Quervain disease if you:  Are a middle-aged woman.  Are pregnant.  Have rheumatoid arthritis.  Have diabetes.  Use your hands far more than normal, especially with a tight grip or excessive twisting. What are the signs or symptoms? Pain on the thumb side of your wrist is the main symptom of de Quervain disease. Other signs and symptoms include:  Pain that gets worse when you grasp something or turn your wrist.  Pain that extends up the forearm.  Cysts in the area of the pain.  Swelling of your wrist and hand.  A sensation of snapping in the wrist.  Trouble moving the thumb and wrist. How is this diagnosed? Your health care provider may diagnose de Quervain disease based on your signs and symptoms. A physical exam will also be done. A simple test (Finkelstein test) that involves pulling your thumb and wrist to see if this causes pain can help determine whether you have the condition. Sometimes you may need to have an X-ray. How is this treated? Avoiding any activity that causes pain and swelling is the best treatment. Other options include:  Wearing a  splint.  Taking medicine. Anti-inflammatory medicines and corticosteroid injections may reduce inflammation and relieve pain.  Having surgery if other treatments do not work. Follow these instructions at home:  Using ice can be helpful after doing activities that involve the sore wrist. To apply ice to the injured area:  Put ice in a plastic bag.  Place a towel between your skin and the bag.  Leave the ice on for 20 minutes, 2-3 times a day.  Take medicines only as directed by your health care provider.  Wear your splint as directed. This will allow your hand to rest and heal. Contact a health care provider if:  Your pain medicine does not help.  Your pain gets worse.  You develop new symptoms. This information is not intended to replace advice given to you by your health care provider. Make sure you discuss any questions you have with your health care provider. Document Released: 12/31/2000 Document Revised: 09/13/2015 Document Reviewed: 08/10/2013  2017 Elsevier  

## 2016-03-18 NOTE — Progress Notes (Signed)
Patient ID: [name], female   DOB: [birth year], 70 y.o.   MRN: [id number]  Chief Complaint  Patient presents with  . Wrist Pain    right wrist pain    HPI [name] is a 70 y.o. female.  Presents for eval of the right wrist   Moderate  burning pain  Right wrist  X 3 months radial side of right wrist over the thumb ara    Review of Systems Review of Systems  Constitutional: Negative.   Respiratory: Negative.   Cardiovascular: Negative.      Past Medical History:  Diagnosis Date  . Arthritis   . Current use of estrogen therapy 03/25/2013  . Essential hypertension   . GERD (gastroesophageal reflux disease)   . H/O cardiac catheterization    2011: normal coronary arteries (false positive stress test).  . Hyperlipidemia   . Hypothyroidism   . LBBB (left bundle branch block)   . Nonischemic cardiomyopathy (HCC)   . Orthostatic hypotension    associated with syncope  . TIA (transient ischemic attack)    On Plavix    Past Surgical History:  Procedure Laterality Date  . ABDOMINAL HYSTERECTOMY    . COLONOSCOPY  12/25/2010   Procedure: COLONOSCOPY;  Surgeon: Malissa HippoNajeeb U Rehman, MD;  Location[facility];  Service: Endoscopy;  Laterality: N/A;  . NECK SURGERY     Secondary to ruptured disc  . PARTIAL HYSTERECTOMY    . POSTERIOR CERVICAL FUSION/FORAMINOTOMY  12/18/2011   Procedure: POSTERIOR CERVICAL FUSION/FORAMINOTOMY LEVEL 2;  Surgeon: Hewitt Shortsobert W Nudelman, MD;  Location: [facility];  Service: Neurosurgery;  Laterality: Bilateral;  Cervical four to six Posterior cervical arthrodesis  . WRIST FUSION     Right- torn ligament    Social History Social History  Substance Use Topics  . Smoking status: Never Smoker  . Smokeless tobacco: Never Used  . Alcohol use No    Allergies  Allergen Reactions  . Codeine Nausea And Vomiting  . Lisinopril Cough  . Other Nausea And Vomiting    Most anesthesia   . Latex Rash    Current Meds  Medication Sig  .  calcium-vitamin D (OSCAL WITH D) 500-200 MG-UNIT per tablet Take 1 tablet by mouth at bedtime.   . carvedilol (COREG) 6.25 MG tablet Take 1 tablet (6.25 mg total) by mouth 2 (two) times daily.  . clopidogrel (PLAVIX) 75 MG tablet Take 1 tablet (75 mg total) by mouth daily.  Marland Kitchen. levothyroxine (SYNTHROID, LEVOTHROID) 75 MCG tablet Take 75 mcg by mouth daily.    Marland Kitchen. losartan (COZAAR) 100 MG tablet Take 1 tablet (100 mg total) by mouth daily.  . pravastatin (PRAVACHOL) 20 MG tablet Take 1 tablet (20 mg total) by mouth daily.  . [DISCONTINUED] acetaminophen (TYLENOL) 500 MG tablet Take 500 mg by mouth every 6 (six) hours as needed for headache.  . [DISCONTINUED] ALPRAZolam (XANAX) 0.25 MG tablet Take 1 tablet (0.25 mg total) by mouth 3 (three) times daily as needed for anxiety.  . [DISCONTINUED] carboxymethylcellulose (REFRESH PLUS) 0.5 % SOLN Place 1 drop into the right eye daily as needed (dry eye).  . [DISCONTINUED] estradiol (VIVELLE-DOT) 0.05 MG/24HR patch APPLY 1 PATCH TWICE WEEKLY  . [DISCONTINUED] levofloxacin (LEVAQUIN) 500 MG tablet Take 1 tablet (500 mg total) by mouth daily.  . [DISCONTINUED] multivitamin-iron-minerals-folic acid (CENTRUM) chewable tablet Chew 1 tablet by mouth at bedtime.   . [DISCONTINUED] traMADol (ULTRAM) 50 MG tablet Take 50 mg by mouth every 6 (six)  hours as needed for moderate pain.       Physical Exam Physical Exam BP (!) 192/89   Pulse 72   Wt 153 lb (69.4 kg)   BMI 25.46 kg/m   Gen. appearance. The patient is well-developed and well-nourished, grooming and hygiene are normal. There are no gross congenital abnormalities  The patient is alert and oriented to person place and time  Mood and affect are normal  Ambulation normal   Examination reveals the following: On inspection we find Tenderness over the first extensor compartment of the right wrist  With the range of motion of  The wrist and hand otherwise normal  Stability tests were normal  wrist  joint and normal Watson test  Strength tests revealed grade 5 motor strength in extensor tendons  Skin we find no rash ulceration or erythema right hand and wrist  Sensation remains intact right hand  Vascular normal pulses right radial artery  Data Reviewed Plain films show no abnormalities in the wrist  Assessment    Encounter Diagnoses  Name Primary?  . Right wrist pain   . De Quervain's disease (radial styloid tenosynovitis) Yes       Plan    Injection Rhino splint Ice therapy Follow-up as needed  Injection right first extensor compartment  Verbal consent timeout taken to confirm injection site. 3 mg of fluid consisting of 2 mL of 1% lidocaine and 40 mg of Depo-Medrol injected after cleaning the skin with alcohol and anesthetized with ethyl chloride  No complications were noted       Fuller CanadaStanley Jentry Mcqueary 03/18/2016, 3:03 PM

## 2016-10-06 DIAGNOSIS — I5020 Unspecified systolic (congestive) heart failure: Secondary | ICD-10-CM | POA: Diagnosis not present

## 2016-10-06 DIAGNOSIS — M7741 Metatarsalgia, right foot: Secondary | ICD-10-CM | POA: Diagnosis not present

## 2016-10-06 DIAGNOSIS — I10 Essential (primary) hypertension: Secondary | ICD-10-CM | POA: Diagnosis not present

## 2016-10-06 DIAGNOSIS — D3610 Benign neoplasm of peripheral nerves and autonomic nervous system, unspecified: Secondary | ICD-10-CM | POA: Diagnosis not present

## 2016-10-06 DIAGNOSIS — E039 Hypothyroidism, unspecified: Secondary | ICD-10-CM | POA: Diagnosis not present

## 2016-10-06 DIAGNOSIS — M79674 Pain in right toe(s): Secondary | ICD-10-CM | POA: Diagnosis not present

## 2016-10-06 DIAGNOSIS — E785 Hyperlipidemia, unspecified: Secondary | ICD-10-CM | POA: Diagnosis not present

## 2016-10-17 DIAGNOSIS — M79671 Pain in right foot: Secondary | ICD-10-CM | POA: Diagnosis not present

## 2016-10-17 DIAGNOSIS — M84374A Stress fracture, right foot, initial encounter for fracture: Secondary | ICD-10-CM | POA: Diagnosis not present

## 2016-10-31 DIAGNOSIS — M79671 Pain in right foot: Secondary | ICD-10-CM | POA: Diagnosis not present

## 2016-10-31 DIAGNOSIS — M84374A Stress fracture, right foot, initial encounter for fracture: Secondary | ICD-10-CM | POA: Diagnosis not present

## 2016-11-07 ENCOUNTER — Encounter: Payer: Self-pay | Admitting: Adult Health

## 2016-11-07 ENCOUNTER — Ambulatory Visit (INDEPENDENT_AMBULATORY_CARE_PROVIDER_SITE_OTHER): Payer: BLUE CROSS/BLUE SHIELD | Admitting: Adult Health

## 2016-11-07 VITALS — BP 150/80 | HR 74 | Ht 65.0 in | Wt 153.0 lb

## 2016-11-07 DIAGNOSIS — K5900 Constipation, unspecified: Secondary | ICD-10-CM | POA: Diagnosis not present

## 2016-11-07 DIAGNOSIS — Z1212 Encounter for screening for malignant neoplasm of rectum: Secondary | ICD-10-CM | POA: Diagnosis not present

## 2016-11-07 DIAGNOSIS — Z1211 Encounter for screening for malignant neoplasm of colon: Secondary | ICD-10-CM

## 2016-11-07 DIAGNOSIS — N8111 Cystocele, midline: Secondary | ICD-10-CM

## 2016-11-07 DIAGNOSIS — N816 Rectocele: Secondary | ICD-10-CM | POA: Diagnosis not present

## 2016-11-07 LAB — HEMOCCULT GUIAC POC 1CARD (OFFICE): FECAL OCCULT BLD: NEGATIVE

## 2016-11-07 MED ORDER — LUBIPROSTONE 24 MCG PO CAPS: 24.0000 ug | ORAL_CAPSULE | Freq: Every day | ORAL | 0 refills | Status: DC

## 2016-11-07 NOTE — Progress Notes (Signed)
Subjective:     Patient ID: [name], female   DOB: 25-May-1945, 71 y.o.   MRN: [id number]  HPI [name] is a 71 year old white female, married, sp hysterectomy, in complaining that something feels like it has dropped, is constipated and has to use laxative and has UI at times. She works 39 hours a week at [employer] and cares for husband that has had several strokes.She still has hot flashes too(was on patch).PCP is Dr Juanetta GoslingHawkins.  Review of Systems +Constipation Something feels like it has dropped UI at times Hot flashes Reviewed past medical,surgical, social and family history. Reviewed medications and allergies.     Objective:   Physical Exam BP (!) 150/80 (BP Location: Left Arm, Patient Position: Sitting, Cuff Size: Small)   Pulse 74   Ht 5\' 5"  (1.651 m)   Wt 153 lb (69.4 kg)   BMI 25.46 kg/m    PHQ 2 score 0. Skin warm and dry.Pelvic: external genitalia is normal in appearance no lesions, vagina: pale with loss of moisture and rugae, mild cystocele,urethra has no lesions or masses noted, cervix and uterus are absent, adnexa: no masses or tenderness noted. Bladder is non tender and no masses felt. On rectal exam has good tone, no masses or hemorrhoids felt, +rectocele.Hemoccult was negative. Discussed the relaxation and will try amitiza to see if can get constipation better, she feels better.  Assessment:     1. Rectocele   2. Constipation, unspecified constipation type   3. 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 lubiprostone (AMITIZA) 24 MCG capsule    Sig: Take 1 capsule (24 mcg total) by mouth daily with breakfast.    Dispense:  24 capsule    Refill:  0    Order Specific Question:   Supervising Provider    Answer:   Lazaro ArmsEURE, LUTHER H [[id number]]     Let me know if wants RX for amitiza  Review medical  Explainer  #4 Follow up prn

## 2016-12-09 ENCOUNTER — Other Ambulatory Visit (HOSPITAL_COMMUNITY): Payer: Self-pay | Admitting: Podiatry

## 2016-12-09 DIAGNOSIS — M79671 Pain in right foot: Secondary | ICD-10-CM | POA: Diagnosis not present

## 2016-12-09 DIAGNOSIS — M84374A Stress fracture, right foot, initial encounter for fracture: Secondary | ICD-10-CM | POA: Diagnosis not present

## 2016-12-09 DIAGNOSIS — Z780 Asymptomatic menopausal state: Secondary | ICD-10-CM

## 2016-12-17 ENCOUNTER — Ambulatory Visit (HOSPITAL_COMMUNITY)
Admission: RE | Admit: 2016-12-17 | Discharge: 2016-12-17 | Disposition: A | Discharge status: Home / Self Care | Payer: BLUE CROSS/BLUE SHIELD | Source: Ambulatory Visit | Attending: Podiatry | Admitting: Podiatry

## 2016-12-17 DIAGNOSIS — M8580 Other specified disorders of bone density and structure, unspecified site: Secondary | ICD-10-CM | POA: Insufficient documentation

## 2016-12-17 DIAGNOSIS — Z780 Asymptomatic menopausal state: Secondary | ICD-10-CM | POA: Diagnosis not present

## 2016-12-17 DIAGNOSIS — Z13820 Encounter for screening for osteoporosis: Secondary | ICD-10-CM | POA: Diagnosis not present

## 2016-12-17 DIAGNOSIS — M85852 Other specified disorders of bone density and structure, left thigh: Secondary | ICD-10-CM | POA: Diagnosis not present

## 2017-01-01 DIAGNOSIS — E039 Hypothyroidism, unspecified: Secondary | ICD-10-CM | POA: Diagnosis not present

## 2017-01-01 DIAGNOSIS — I5020 Unspecified systolic (congestive) heart failure: Secondary | ICD-10-CM | POA: Diagnosis not present

## 2017-01-01 DIAGNOSIS — I10 Essential (primary) hypertension: Secondary | ICD-10-CM | POA: Diagnosis not present

## 2017-01-01 DIAGNOSIS — M818 Other osteoporosis without current pathological fracture: Secondary | ICD-10-CM | POA: Diagnosis not present

## 2017-01-19 DIAGNOSIS — H4323 Crystalline deposits in vitreous body, bilateral: Secondary | ICD-10-CM | POA: Diagnosis not present

## 2017-01-30 DIAGNOSIS — M79671 Pain in right foot: Secondary | ICD-10-CM | POA: Diagnosis not present

## 2017-01-30 DIAGNOSIS — M84374A Stress fracture, right foot, initial encounter for fracture: Secondary | ICD-10-CM | POA: Diagnosis not present

## 2017-02-13 DIAGNOSIS — M79671 Pain in right foot: Secondary | ICD-10-CM | POA: Diagnosis not present

## 2017-02-13 DIAGNOSIS — M84374A Stress fracture, right foot, initial encounter for fracture: Secondary | ICD-10-CM | POA: Diagnosis not present

## 2017-02-26 DIAGNOSIS — H35033 Hypertensive retinopathy, bilateral: Secondary | ICD-10-CM | POA: Diagnosis not present

## 2017-02-26 DIAGNOSIS — H25013 Cortical age-related cataract, bilateral: Secondary | ICD-10-CM | POA: Diagnosis not present

## 2017-02-26 DIAGNOSIS — H40013 Open angle with borderline findings, low risk, bilateral: Secondary | ICD-10-CM | POA: Diagnosis not present

## 2017-02-26 DIAGNOSIS — H2513 Age-related nuclear cataract, bilateral: Secondary | ICD-10-CM | POA: Diagnosis not present

## 2017-04-16 DIAGNOSIS — J209 Acute bronchitis, unspecified: Secondary | ICD-10-CM | POA: Diagnosis not present

## 2017-04-16 DIAGNOSIS — I951 Orthostatic hypotension: Secondary | ICD-10-CM | POA: Diagnosis not present

## 2017-04-16 DIAGNOSIS — M5030 Other cervical disc degeneration, unspecified cervical region: Secondary | ICD-10-CM | POA: Diagnosis not present

## 2017-04-16 DIAGNOSIS — I5020 Unspecified systolic (congestive) heart failure: Secondary | ICD-10-CM | POA: Diagnosis not present

## 2017-04-28 DIAGNOSIS — H25811 Combined forms of age-related cataract, right eye: Secondary | ICD-10-CM | POA: Diagnosis not present

## 2017-04-28 DIAGNOSIS — H2511 Age-related nuclear cataract, right eye: Secondary | ICD-10-CM | POA: Diagnosis not present

## 2017-05-18 DIAGNOSIS — H2512 Age-related nuclear cataract, left eye: Secondary | ICD-10-CM | POA: Diagnosis not present

## 2017-05-18 DIAGNOSIS — H25012 Cortical age-related cataract, left eye: Secondary | ICD-10-CM | POA: Diagnosis not present

## 2017-05-26 DIAGNOSIS — H2522 Age-related cataract, morgagnian type, left eye: Secondary | ICD-10-CM | POA: Diagnosis not present

## 2017-05-26 DIAGNOSIS — H2512 Age-related nuclear cataract, left eye: Secondary | ICD-10-CM | POA: Diagnosis not present

## 2017-05-26 DIAGNOSIS — H25812 Combined forms of age-related cataract, left eye: Secondary | ICD-10-CM | POA: Diagnosis not present

## 2017-06-22 DIAGNOSIS — L03032 Cellulitis of left toe: Secondary | ICD-10-CM | POA: Diagnosis not present

## 2017-06-22 DIAGNOSIS — L600 Ingrowing nail: Secondary | ICD-10-CM | POA: Diagnosis not present

## 2017-06-22 DIAGNOSIS — M79672 Pain in left foot: Secondary | ICD-10-CM | POA: Diagnosis not present

## 2017-06-22 DIAGNOSIS — M2042 Other hammer toe(s) (acquired), left foot: Secondary | ICD-10-CM | POA: Diagnosis not present

## 2017-07-14 DIAGNOSIS — F419 Anxiety disorder, unspecified: Secondary | ICD-10-CM | POA: Diagnosis not present

## 2017-07-14 DIAGNOSIS — M5030 Other cervical disc degeneration, unspecified cervical region: Secondary | ICD-10-CM | POA: Diagnosis not present

## 2017-07-14 DIAGNOSIS — I10 Essential (primary) hypertension: Secondary | ICD-10-CM | POA: Diagnosis not present

## 2017-07-14 DIAGNOSIS — I5020 Unspecified systolic (congestive) heart failure: Secondary | ICD-10-CM | POA: Diagnosis not present

## 2017-08-25 DIAGNOSIS — H6120 Impacted cerumen, unspecified ear: Secondary | ICD-10-CM | POA: Diagnosis not present

## 2017-09-10 DIAGNOSIS — I10 Essential (primary) hypertension: Secondary | ICD-10-CM | POA: Diagnosis not present

## 2017-09-10 DIAGNOSIS — H6120 Impacted cerumen, unspecified ear: Secondary | ICD-10-CM | POA: Diagnosis not present

## 2017-09-10 DIAGNOSIS — I951 Orthostatic hypotension: Secondary | ICD-10-CM | POA: Diagnosis not present

## 2017-09-10 DIAGNOSIS — I5020 Unspecified systolic (congestive) heart failure: Secondary | ICD-10-CM | POA: Diagnosis not present

## 2017-10-23 DIAGNOSIS — E039 Hypothyroidism, unspecified: Secondary | ICD-10-CM | POA: Diagnosis not present

## 2017-10-23 DIAGNOSIS — G459 Transient cerebral ischemic attack, unspecified: Secondary | ICD-10-CM | POA: Diagnosis not present

## 2017-10-23 DIAGNOSIS — I10 Essential (primary) hypertension: Secondary | ICD-10-CM | POA: Diagnosis not present

## 2017-10-23 DIAGNOSIS — E785 Hyperlipidemia, unspecified: Secondary | ICD-10-CM | POA: Diagnosis not present

## 2018-06-29 DIAGNOSIS — L918 Other hypertrophic disorders of the skin: Secondary | ICD-10-CM | POA: Diagnosis not present

## 2018-06-29 DIAGNOSIS — L821 Other seborrheic keratosis: Secondary | ICD-10-CM | POA: Diagnosis not present

## 2018-06-29 DIAGNOSIS — B078 Other viral warts: Secondary | ICD-10-CM | POA: Diagnosis not present

## 2018-07-02 DIAGNOSIS — I10 Essential (primary) hypertension: Secondary | ICD-10-CM | POA: Diagnosis not present

## 2018-07-02 DIAGNOSIS — J301 Allergic rhinitis due to pollen: Secondary | ICD-10-CM | POA: Diagnosis not present

## 2018-07-02 DIAGNOSIS — J209 Acute bronchitis, unspecified: Secondary | ICD-10-CM | POA: Diagnosis not present

## 2018-07-02 DIAGNOSIS — I5020 Unspecified systolic (congestive) heart failure: Secondary | ICD-10-CM | POA: Diagnosis not present

## 2019-02-07 DIAGNOSIS — E039 Hypothyroidism, unspecified: Secondary | ICD-10-CM | POA: Diagnosis not present

## 2019-02-07 DIAGNOSIS — E785 Hyperlipidemia, unspecified: Secondary | ICD-10-CM | POA: Diagnosis not present

## 2019-02-07 DIAGNOSIS — I10 Essential (primary) hypertension: Secondary | ICD-10-CM | POA: Diagnosis not present

## 2019-02-07 DIAGNOSIS — M818 Other osteoporosis without current pathological fracture: Secondary | ICD-10-CM | POA: Diagnosis not present

## 2019-02-23 DIAGNOSIS — E039 Hypothyroidism, unspecified: Secondary | ICD-10-CM | POA: Diagnosis not present

## 2019-02-23 DIAGNOSIS — E785 Hyperlipidemia, unspecified: Secondary | ICD-10-CM | POA: Diagnosis not present

## 2019-02-23 DIAGNOSIS — Z1321 Encounter for screening for nutritional disorder: Secondary | ICD-10-CM | POA: Diagnosis not present

## 2019-02-23 DIAGNOSIS — I10 Essential (primary) hypertension: Secondary | ICD-10-CM | POA: Diagnosis not present

## 2019-02-28 DIAGNOSIS — E039 Hypothyroidism, unspecified: Secondary | ICD-10-CM | POA: Diagnosis not present

## 2019-02-28 DIAGNOSIS — Z1211 Encounter for screening for malignant neoplasm of colon: Secondary | ICD-10-CM | POA: Diagnosis not present

## 2019-02-28 DIAGNOSIS — I5022 Chronic systolic (congestive) heart failure: Secondary | ICD-10-CM | POA: Diagnosis not present

## 2019-02-28 DIAGNOSIS — M818 Other osteoporosis without current pathological fracture: Secondary | ICD-10-CM | POA: Diagnosis not present

## 2019-02-28 DIAGNOSIS — I10 Essential (primary) hypertension: Secondary | ICD-10-CM | POA: Diagnosis not present

## 2019-03-25 DIAGNOSIS — J040 Acute laryngitis: Secondary | ICD-10-CM | POA: Diagnosis not present

## 2019-04-18 ENCOUNTER — Emergency Department (HOSPITAL_COMMUNITY)
Admission: EM | Admit: 2019-04-18 | Discharge: 2019-04-18 | Disposition: A | Discharge status: Home / Self Care | Payer: BC Managed Care – PPO | Attending: Emergency Medicine | Admitting: Emergency Medicine

## 2019-04-18 ENCOUNTER — Other Ambulatory Visit: Payer: Self-pay

## 2019-04-18 ENCOUNTER — Emergency Department (HOSPITAL_COMMUNITY): Payer: BC Managed Care – PPO

## 2019-04-18 ENCOUNTER — Encounter (HOSPITAL_COMMUNITY): Payer: Self-pay

## 2019-04-18 DIAGNOSIS — I447 Left bundle-branch block, unspecified: Secondary | ICD-10-CM | POA: Diagnosis not present

## 2019-04-18 DIAGNOSIS — R7989 Other specified abnormal findings of blood chemistry: Secondary | ICD-10-CM | POA: Diagnosis not present

## 2019-04-18 DIAGNOSIS — I5032 Chronic diastolic (congestive) heart failure: Secondary | ICD-10-CM | POA: Insufficient documentation

## 2019-04-18 DIAGNOSIS — R0602 Shortness of breath: Secondary | ICD-10-CM | POA: Diagnosis not present

## 2019-04-18 DIAGNOSIS — Z91040 Latex allergy status: Secondary | ICD-10-CM | POA: Insufficient documentation

## 2019-04-18 DIAGNOSIS — I110 Hypertensive heart disease with heart failure: Secondary | ICD-10-CM | POA: Diagnosis not present

## 2019-04-18 DIAGNOSIS — E039 Hypothyroidism, unspecified: Secondary | ICD-10-CM | POA: Diagnosis not present

## 2019-04-18 DIAGNOSIS — Z79899 Other long term (current) drug therapy: Secondary | ICD-10-CM | POA: Diagnosis not present

## 2019-04-18 DIAGNOSIS — Z20828 Contact with and (suspected) exposure to other viral communicable diseases: Secondary | ICD-10-CM | POA: Insufficient documentation

## 2019-04-18 DIAGNOSIS — R0789 Other chest pain: Secondary | ICD-10-CM | POA: Diagnosis not present

## 2019-04-18 DIAGNOSIS — I499 Cardiac arrhythmia, unspecified: Secondary | ICD-10-CM | POA: Diagnosis not present

## 2019-04-18 DIAGNOSIS — R079 Chest pain, unspecified: Secondary | ICD-10-CM | POA: Diagnosis not present

## 2019-04-18 LAB — BRAIN NATRIURETIC PEPTIDE: B Natriuretic Peptide: 86 pg/mL (ref 0.0–100.0)

## 2019-04-18 LAB — POC SARS CORONAVIRUS 2 AG -  ED
SARS Coronavirus 2 Ag: NEGATIVE
SARS Coronavirus 2 Ag: NEGATIVE

## 2019-04-18 LAB — BASIC METABOLIC PANEL
Anion gap: 13 (ref 5–15)
BUN: 41 mg/dL — ABNORMAL HIGH (ref 8–23)
CO2: 23 mmol/L (ref 22–32)
Calcium: 9.1 mg/dL (ref 8.9–10.3)
Chloride: 100 mmol/L (ref 98–111)
Creatinine, Ser: 1.65 mg/dL — ABNORMAL HIGH (ref 0.44–1.00)
GFR calc Af Amer: 36 mL/min — ABNORMAL LOW (ref >60)
GFR calc non Af Amer: 31 mL/min — ABNORMAL LOW (ref >60)
Glucose, Bld: 124 mg/dL — ABNORMAL HIGH (ref 70–99)
Potassium: 4.1 mmol/L (ref 3.5–5.1)
Sodium: 136 mmol/L (ref 135–145)

## 2019-04-18 LAB — CBC WITH DIFFERENTIAL/PLATELET
Abs Immature Granulocytes: 0.01 10*3/uL (ref 0.00–0.07)
Basophils Absolute: 0 10*3/uL (ref 0.0–0.1)
Basophils Relative: 1 %
Eosinophils Absolute: 0.1 10*3/uL (ref 0.0–0.5)
Eosinophils Relative: 2 %
HCT: 36.4 % (ref 36.0–46.0)
Hemoglobin: 12.3 g/dL (ref 12.0–15.0)
Immature Granulocytes: 0 %
Lymphocytes Relative: 24 %
Lymphs Abs: 1.6 10*3/uL (ref 0.7–4.0)
MCH: 31.9 pg (ref 26.0–34.0)
MCHC: 33.8 g/dL (ref 30.0–36.0)
MCV: 94.3 fL (ref 80.0–100.0)
Monocytes Absolute: 0.4 10*3/uL (ref 0.1–1.0)
Monocytes Relative: 6 %
Neutro Abs: 4.6 10*3/uL (ref 1.7–7.7)
Neutrophils Relative %: 67 %
Platelets: 192 10*3/uL (ref 150–400)
RBC: 3.86 MIL/uL — ABNORMAL LOW (ref 3.87–5.11)
RDW: 12.3 % (ref 11.5–15.5)
WBC: 6.9 10*3/uL (ref 4.0–10.5)
nRBC: 0 % (ref 0.0–0.2)

## 2019-04-18 LAB — TROPONIN I (HIGH SENSITIVITY)
Troponin I (High Sensitivity): 4 ng/L (ref <18)
Troponin I (High Sensitivity): 4 ng/L (ref <18)

## 2019-04-18 LAB — D-DIMER, QUANTITATIVE (NOT AT ARMC): D-Dimer, Quant: 0.48 ug/mL-FEU (ref 0.00–0.50)

## 2019-04-18 MED ORDER — SODIUM CHLORIDE 0.9 % IV BOLUS
1000.0000 mL | Freq: Once | INTRAVENOUS | Status: AC
  Administered 2019-04-18: 1000 mL via INTRAVENOUS

## 2019-04-18 NOTE — Discharge Instructions (Addendum)
You were seen in our emergency department for an episode of shortness of breath.  Your work-up here did not show that you are having an active heart attack.  However, as I explained, it is still possible that you are having a significant cardiac issue that is causing your symptoms.  I recommended that you come into the hospital for further evaluation and testing, however you stated that you would not be staying in the hospital, and that you needed to go home. I therefore advised that you call your cardiologist's office today to try to arrange a follow up appointment as soon as possible.  You may need a stress test and echocardiogram.  You called 911 and come back to the hospital begin immediately if you have recurrent chest pain, difficulty breathing, lightheadedness, or feel like passing out.  Your bloodwork today also showed that your kidney function has somewhat worsened from the last time it was checked.  Please drink plenty of water at home.  Have your primary care doctor, or your cardiologist, arrange to have your kidney levels rechecked in 1-2 weeks (your "creatinine" level was 1.6 today, normal is less than 1.2).  This may be due to dehydration, but there may be other causes.

## 2019-04-18 NOTE — ED Provider Notes (Signed)
[facility] EMERGENCY DEPARTMENT Provider Note   CSN: [id number] Arrival date & time: 04/18/19  0845     History Chief Complaint  Patient presents with  . Chest Pain    [name] is a 73 y.o. female w/ hx of LBBB, HLD, hypothyroidism, nonischemic cardiomyopathy (last echo 2016, EF 40-45%), HTN, TIA, presenting to the ED with chest pain, shoulder pain and shortness of breath.  Patient reports she was in her usual state health when she woke up this morning.  She went to work and was at rest, doing paperwork, when she began having pain between her shoulders and in her upper back.  She reports she has chronic pain in this region and suffers from this kind of pain nearly every single day.  However the pain worsened and this time is associated with a pressure sensation in the front of her chest as well as shortness of breath, and she tells me the shortness of breath is a new symptom.  She came very anxious to call 911.  EMS reports that they gave her 324 mg aspirin and 1 SL nitro, and reports that subsequently the patient's pain was reduced from 8 out of 10 to a 4 out of 10.  On arrival the patient reports her pain is now minimal in her chest, now 2 out of 10.  She does report that she has continued aching between her shoulders, but feels like this is her chronic shoulder pain.  She does no longer feel short of breath.  No hemoptysis or asymmetric LE edema. Patient denies personal or family history of DVT or PE. No recent hormone use (including OCP); travel for >6 hours; prolonged immobilization for greater than 3 days; surgeries or trauma in the last 4 weeks; or malignancy with treatment within 6 months.  No hx of cardiac stents or MI.  Patient's cardiologist is Dr Andree CossJonathon Berry, whom she last saw in 2016.  Her last cardiac cath was in 2011 showing no significant CAD.  Last echo in 11/20/2014, impression:  Study Conclusions  - Left ventricle: The cavity size was normal. There was  moderate   concentric hypertrophy. Systolic function was mildly to   moderately reduced. The estimated ejection fraction was in the   range of 40% to 45%. Diffusely hypokinetic. Doppler parameters   are consistent with abnormal left ventricular relaxation (grade 1   diastolic dysfunction). Doppler parameters are consistent with   high ventricular filling pressure. - Ventricular septum: Septal motion showed abnormal function and   dyssynergy. These changes are consistent with a left bundle   branch block. - Mitral valve: There was moderate regurgitation. - Tricuspid valve: There was mild regurgitation.  HPI     Past Medical History:  Diagnosis Date  . Arthritis   . Current use of estrogen therapy 03/25/2013  . Essential hypertension   . GERD (gastroesophageal reflux disease)   . H/O cardiac catheterization    2011: normal coronary arteries (false positive stress test).  . Hyperlipidemia   . Hypothyroidism   . LBBB (left bundle branch block)   . Nonischemic cardiomyopathy (HCC)   . Orthostatic hypotension    associated with syncope  . TIA (transient ischemic attack)    On Plavix    Patient Active Problem List   Diagnosis Date Noted  . Acute on chronic diastolic CHF (congestive heart failure), NYHA class 1 (HCC) 02/06/2014  . Hypertensive urgency 02/06/2014  . LBBB (left bundle branch block) 12/09/2013  . Hypothyroidism 12/09/2013  .  Syncope 12/08/2013  . Orthostatic hypotension 12/07/2013  . TIA (transient ischemic attack) 12/05/2013  . Current use of estrogen therapy 03/25/2013  . Nonspecific abnormal electrocardiogram (ECG) (EKG) 01/03/2011  . HYPERLIPIDEMIA-MIXED 05/08/2010  . HYPERTENSION, BENIGN 05/08/2010  . OSTEOARTHROSIS UNSPEC WHETHER GEN/LOCALIZED HAND 01/24/2010    Past Surgical History:  Procedure Laterality Date  . ABDOMINAL HYSTERECTOMY    . COLONOSCOPY  12/25/2010   Procedure: COLONOSCOPY;  Surgeon: Rogene Houston, MD;  Location: [facility];   Service: Endoscopy;  Laterality: N/A;  . NECK SURGERY     Secondary to ruptured disc  . PARTIAL HYSTERECTOMY    . POSTERIOR CERVICAL FUSION/FORAMINOTOMY  12/18/2011   Procedure: POSTERIOR CERVICAL FUSION/FORAMINOTOMY LEVEL 2;  Surgeon: Hosie Spangle, MD;  Location: [facility];  Service: Neurosurgery;  Laterality: Bilateral;  Cervical four to six Posterior cervical arthrodesis  . WRIST FUSION     Right- torn ligament     OB History    Gravida  1   Para  1   Term      Preterm      AB      Living  1     SAB      TAB      Ectopic      Multiple      Live Births  1           Family History  Problem Relation Age of Onset  . Colon cancer Mother   . Lung cancer Mother   . Diabetes Father   . Cancer Brother        Back    Social History   Tobacco Use  . Smoking status: Never Smoker  . Smokeless tobacco: Never Used  Substance Use Topics  . Alcohol use: No  . Drug use: No    Home Medications Prior to Admission medications   Medication Sig Start Date End Date Taking? Authorizing Provider  calcium-vitamin D (OSCAL WITH D) 500-200 MG-UNIT per tablet Take 1 tablet by mouth at bedtime.     [provider]  carvedilol (COREG) 6.25 MG tablet Take 1 tablet (6.25 mg total) by mouth 2 (two) times daily. 02/14/14   Lendon Colonel, NP  clopidogrel (PLAVIX) 75 MG tablet Take 1 tablet (75 mg total) by mouth daily. 02/07/14   Sinda Du, MD  levothyroxine (SYNTHROID, LEVOTHROID) 75 MCG tablet Take 75 mcg by mouth daily.      [provider]  losartan (COZAAR) 100 MG tablet Take 1 tablet (100 mg total) by mouth daily. 02/07/14   Sinda Du, MD  lubiprostone (AMITIZA) 24 MCG capsule Take 1 capsule (24 mcg total) by mouth daily with breakfast. 11/07/16   Estill Dooms, NP  pravastatin (PRAVACHOL) 20 MG tablet Take 1 tablet (20 mg total) by mouth daily. 12/07/13   Sinda Du, MD    Allergies    Codeine, Lisinopril, Other, and  Latex  Review of Systems   Review of Systems  Constitutional: Negative for chills and fever.  Eyes: Negative for photophobia and visual disturbance.  Respiratory: Positive for shortness of breath. Negative for cough.   Cardiovascular: Positive for chest pain. Negative for palpitations and leg swelling.  Gastrointestinal: Negative for abdominal pain, nausea and vomiting.  Musculoskeletal: Positive for back pain and neck pain.  Skin: Negative for pallor and rash.  Neurological: Negative for syncope and light-headedness.  Psychiatric/Behavioral: Negative for agitation and confusion.  All other systems reviewed and are negative.   Physical  Exam Updated Vital Signs BP (!) 210/78   Pulse 66   Resp 13   Ht 5\' 5"  (1.651 m)   Wt 69.4 kg   SpO2 99%   BMI 25.46 kg/m   Physical Exam Vitals and nursing note reviewed.  Constitutional:      General: She is not in acute distress.    Appearance: She is well-developed.  HENT:     Head: Normocephalic and atraumatic.  Eyes:     Conjunctiva/sclera: Conjunctivae normal.  Cardiovascular:     Rate and Rhythm: Normal rate and regular rhythm.     Heart sounds: No murmur.  Pulmonary:     Effort: Pulmonary effort is normal. No respiratory distress.     Breath sounds: Normal breath sounds.     Comments: 100% on room air Abdominal:     Palpations: Abdomen is soft.     Tenderness: There is no abdominal tenderness.  Musculoskeletal:     Cervical back: Neck supple.  Skin:    General: Skin is warm and dry.  Neurological:     General: No focal deficit present.     Mental Status: She is alert and oriented to person, place, and time.     ED Results / Procedures / Treatments   Labs (all labs ordered are listed, but only abnormal results are displayed) Labs Reviewed  BASIC METABOLIC PANEL - Abnormal; Notable for the following components:      Result Value   Glucose, Bld 124 (*)    BUN 41 (*)    Creatinine, Ser 1.65 (*)    GFR calc non Af  Amer 31 (*)    GFR calc Af Amer 36 (*)    All other components within normal limits  CBC WITH DIFFERENTIAL/PLATELET - Abnormal; Notable for the following components:   RBC 3.86 (*)    All other components within normal limits  D-DIMER, QUANTITATIVE (NOT AT [facility])  BRAIN NATRIURETIC PEPTIDE  POC SARS CORONAVIRUS 2 AG -  ED  POC SARS CORONAVIRUS 2 AG -  ED  TROPONIN I (HIGH SENSITIVITY)  TROPONIN I (HIGH SENSITIVITY)    EKG EKG Interpretation  Date/Time:  Monday April 18 2019 09:13:11 EST Ventricular Rate:  70 PR Interval:    QRS Duration: 144 QT Interval:  442 QTC Calculation: 477 R Axis:   51 Text Interpretation: Sinus rhythm T wave inversions in inferior  leads and V5-v6  new from prior tracing from Oct 2015 LBBB with ST elevations, does not meet sgarbossa criteria, no  STEMI Confirmed by Nov 2015 [phone]) on 04/18/2019 9:15:40 AM   Radiology DG Chest Portable 1 View  Result Date: 04/18/2019 CLINICAL DATA:  Shortness of breath, chest pain EXAM: PORTABLE CHEST 1 VIEW COMPARISON:  2015 FINDINGS: The heart size and mediastinal contours are within normal limits. Probable minimal left lower lung atelectasis. No significant pleural effusion. No pneumothorax. The visualized skeletal structures are unremarkable. IMPRESSION: Probable minimal left lower lung atelectasis. Electronically Signed   By: 2016 M.D.   On: 04/18/2019 09:41    Procedures Procedures (including critical care time)  Medications Ordered in ED Medications  sodium chloride 0.9 % bolus 1,000 mL (0 mLs Intravenous Stopped 04/18/19 1303)    ED Course  I have reviewed the triage vital signs and the nursing notes.  Pertinent labs & imaging results that were available during my care of the patient were reviewed by me and considered in my medical decision making (see chart for details).  73 year old female presented  to emerge department chest pain, shortness of breath, shoulder pain.  She reports  that her shoulder pain feel like her typical pain that she is been suffering for for many years, and has a Lidoderm patch on her back in that region.  However the pressure in the front of her chest and shortness of breath were new.  EMS gave her 1 sublingual nitro as well as full dose aspirin, and she reports improvement of her symptoms since arriving in the ED.  Her symptoms began at rest today.  Plan to work her up for acute coronary syndrome and pulmonary embolism.  Will also obtain a rapid Covid test.  We will get a chest x-ray and an EKG.  She is a known history of left bundle branch block.  Also on the differential is cervical angina given the association she feels between her neck pain and her chest pain today.  [name] was evaluated in Emergency Department on 04/18/2019 for the symptoms described in the history of present illness. She was evaluated in the context of the global COVID-19 pandemic, which necessitated consideration that the patient might be at risk for infection with the SARS-CoV-2 virus that causes COVID-19. Institutional protocols and algorithms that pertain to the evaluation of patients at risk for COVID-19 are in a state of rapid change based on information released by regulatory bodies including the CDC and federal and state organizations. These policies and algorithms were followed during the patient's care in the ED.  This note was dictated using dragon dictation software.  Please be aware that there may be minor translation errors as a result of this oral dictation  Clinical Course as of Apr 18 1847  Mon Apr 18, 2019  0927 Temp was 51F orally   [MT]  1115 I spoke to Dr Wyline Mood of cardiology who agrees with hospitalization, echocardiogram, will admit for chest pain and AKI   [MT]  1116 Patient is currently pain-free.  I had a discussion with her about her cardiac risk factors and her kidney injury, she is adamant she does not want to stay in the hospital.  She  says she has a sick husband at home and she is his only caretaker.  She understands the risks of leaving before her cardiac workup is complete.  I explained to her that I cannot guarantee she is having issues with her heart which is causing the symptoms, this is the kind of thing which could potentially kill someone rapidly.  Therefore I urged her to have very rapid follow-up.  She says she will call her cardiologist to follow-up on these issues.  Advised her that if she has chest pain she should call 911 and come back.   [MT]    Clinical Course User Index [MT] Charmain Diosdado, Kermit Balo, MD     Final Clinical Impression(s) / ED Diagnoses Final diagnoses:  Shortness of breath  Elevated serum creatinine    Rx / DC Orders ED Discharge Orders    None       Daesean Lazarz, Kermit Balo, MD 04/18/19 [id number]

## 2019-04-18 NOTE — ED Triage Notes (Addendum)
Ems says pt was at work and had a near syncopal episode.  Reports started having some cp.  Reports pain was initially at 8 and was sob.  Reports pt took 324mg  asa and some tylenol.  bp 156'F systolic.  When ems arrived, the pain was down to a 5 and decreased to a 2 or 3 after 1 nitro with ems.  Reports sob much better.    EMS says pt was in afib and left bundle branch block on ekg.  Unsure if it is new.

## 2019-04-19 ENCOUNTER — Telehealth: Payer: Self-pay

## 2019-04-19 NOTE — Telephone Encounter (Signed)
Called pt to rearrange appt to see Dr Gwenlyn Found for a f/u from ED for chest pain. Made appt for 12/30 at 1:30pm

## 2019-04-19 NOTE — Telephone Encounter (Signed)
Patient returning Kayla's call.  

## 2019-04-20 ENCOUNTER — Other Ambulatory Visit: Payer: Self-pay

## 2019-04-20 ENCOUNTER — Ambulatory Visit: Payer: BC Managed Care – PPO | Admitting: Cardiovascular Disease

## 2019-04-20 ENCOUNTER — Encounter: Payer: Self-pay | Admitting: Cardiovascular Disease

## 2019-04-20 DIAGNOSIS — R55 Syncope and collapse: Secondary | ICD-10-CM

## 2019-04-20 DIAGNOSIS — I10 Essential (primary) hypertension: Secondary | ICD-10-CM

## 2019-04-20 DIAGNOSIS — I447 Left bundle-branch block, unspecified: Secondary | ICD-10-CM

## 2019-04-20 NOTE — Assessment & Plan Note (Signed)
History of hyperlipidemia on statin therapy with lipid profile performed 02/23/2019 revealing total cholesterol 149, LDL 61 and HDL 62.

## 2019-04-20 NOTE — Assessment & Plan Note (Signed)
Chronic. 

## 2019-04-20 NOTE — Assessment & Plan Note (Signed)
History of essential hypertension blood pressure measured today 142/64.  She is on losartan.

## 2019-04-20 NOTE — Assessment & Plan Note (Signed)
History of syncope in the past thought to be related to orthostasis.

## 2019-04-20 NOTE — Progress Notes (Signed)
04/20/2019 [facility]   06-Apr-1946  [id number]  Primary Physician Benita Stabile, MD Primary Cardiologist: Runell Gess MD Milagros Loll, [employer], [place]  HPI:  [name] is a 73 y.o.  married Caucasian female whose husband [name] was a patient of mine remotely.  She is a mother of 1 daughter, grandmother 1 grandchild.  Her daughter [name] is accompanying her today.  I last saw her in the office 01/26/2015.  I apparently performed catheterization of her in 2011 revealing normal coronary arteries after false positive stress test. She has a history of difficult to control hypertension, orthostatic hypotension, hype her lipidemia and left bundle branch block. Recent echo has shown a decline in her EF from the 55% range down to the 40-45% range with moderate global hypokinesia for unclear reasons. She's had several episodes of syncope which she describes as orthostatic in nature. She saw Bailey Mech  NP along with Dr. Simona Huh in [place]. An event monitor was ordered by never got put on.  I last saw her about 5 years ago.  She is done well since that time until 04/18/2019 when she developed some neck pain related to her cervical disc disease.  This then progressed to shortness of breath and chest pain.  She was seen in the emergency room and evaluated.  She has chronic left bundle branch block.  Her troponins were low and flat.  Her symptoms resolved.  She said no recurrent symptoms.  She suspects this was a "panic attack".   Current Meds  Medication Sig  . alendronate (FOSAMAX) 70 MG tablet Take 70 mg by mouth once a week. Take with a full glass of water on an empty stomach.  . carvedilol (COREG) 12.5 MG tablet Take 12.5 mg by mouth 2 (two) times daily with a meal.  . clopidogrel (PLAVIX) 75 MG tablet Take 1 tablet (75 mg total) by mouth daily.  . Levothyroxine Sodium 88 MCG CAPS Take 88 mcg by mouth daily before breakfast.  . losartan (COZAAR) 100 MG tablet Take 1 tablet (100  mg total) by mouth daily.  . Multiple Vitamins-Minerals (CENTRUM ADULTS PO) Take 1 tablet by mouth daily.  . pravastatin (PRAVACHOL) 20 MG tablet Take 1 tablet (20 mg total) by mouth daily.     Allergies  Allergen Reactions  . Codeine Nausea And Vomiting  . Lisinopril Cough  . Other Nausea And Vomiting    Most anesthesia   . Latex Rash    Social History   Socioeconomic History  . Marital status: Married    Spouse name: Not on file  . Number of children: Not on file  . Years of education: Not on file  . Highest education level: Not on file  Occupational History  . Not on file  Tobacco Use  . Smoking status: Never Smoker  . Smokeless tobacco: Never Used  Substance and Sexual Activity  . Alcohol use: No  . Drug use: No  . Sexual activity: Never  Other Topics Concern  . Not on file  Social History Narrative  . Not on file   Social Determinants of Health   Financial Resource Strain:   . Difficulty of Paying Living Expenses: Not on file  Food Insecurity:   . Worried About Programme researcher, broadcasting/film/video in the Last Year: Not on file  . Ran Out of Food in the Last Year: Not on file  Transportation Needs:   . Lack of Transportation (Medical): Not on file  .  Lack of Transportation (Non-Medical): Not on file  Physical Activity:   . Days of Exercise per Week: Not on file  . Minutes of Exercise per Session: Not on file  Stress:   . Feeling of Stress : Not on file  Social Connections:   . Frequency of Communication with Friends and Family: Not on file  . Frequency of Social Gatherings with Friends and Family: Not on file  . Attends Religious Services: Not on file  . Active Member of Clubs or Organizations: Not on file  . Attends Archivist Meetings: Not on file  . Marital Status: Not on file  Intimate Partner Violence:   . Fear of Current or Ex-Partner: Not on file  . Emotionally Abused: Not on file  . Physically Abused: Not on file  . Sexually Abused: Not on file       Review of Systems: General: negative for chills, fever, night sweats or weight changes.  Cardiovascular: negative for chest pain, dyspnea on exertion, edema, orthopnea, palpitations, paroxysmal nocturnal dyspnea or shortness of breath Dermatological: negative for rash Respiratory: negative for cough or wheezing Urologic: negative for hematuria Abdominal: negative for nausea, vomiting, diarrhea, bright red blood per rectum, melena, or hematemesis Neurologic: negative for visual changes, syncope, or dizziness All other systems reviewed and are otherwise negative except as noted above.    Blood pressure (!) 142/64, temperature (!) 95.7 F (35.4 C), height 5\' 5"  (1.651 m), weight 152 lb (68.9 kg).  General appearance: alert and no distress Neck: no adenopathy, no carotid bruit, no JVD, supple, symmetrical, trachea midline and thyroid not enlarged, symmetric, no tenderness/mass/nodules Lungs: clear to auscultation bilaterally Heart: regular rate and rhythm, S1, S2 normal, no murmur, click, rub or gallop Extremities: extremities normal, atraumatic, no cyanosis or edema Pulses: 2+ and symmetric Skin: Skin color, texture, turgor normal. No rashes or lesions Neurologic: Alert and oriented X 3, normal strength and tone. Normal symmetric reflexes. Normal coordination and gait  EKG not performed today  ASSESSMENT AND PLAN:   HYPERTENSION, BENIGN History of essential hypertension blood pressure measured today 142/64.  She is on losartan.  LBBB (left bundle branch block) Chronic  HYPERLIPIDEMIA-MIXED History of hyperlipidemia on statin therapy with lipid profile performed 02/23/2019 revealing total cholesterol 149, LDL 61 and HDL 62.  Syncope History of syncope in the past thought to be related to orthostasis.      Lorretta Harp MD FACP,FACC,FAHA, FSCAI 04/20/2019 1:32 PM

## 2019-04-20 NOTE — Patient Instructions (Signed)
Medication Instructions:  Your physician recommends that you continue on your current medications as directed. Please refer to the Current Medication list given to you today.  If you need a refill on your cardiac medications before your next appointment, please call your pharmacy.   Lab work: NONE  Testing/Procedures: NONE  Follow-Up: At [facility], you and your health needs are our priority.  As part of our continuing mission to provide you with exceptional heart care, we have created designated Provider Care Teams.  These Care Teams include your primary Cardiologist (physician) and Advanced Practice Providers (APPs -  Physician Assistants and Nurse Practitioners) who all work together to provide you with the care you need, when you need it. You may see Dr Berry or one of the following Advanced Practice Providers on your designated Care Team:    Luke Kilroy, PA-C  Callie Goodrich, PA-C  Jesse Cleaver, FNP Your physician wants you to follow-up in: 1 year      

## 2019-04-24 ENCOUNTER — Other Ambulatory Visit: Payer: Self-pay

## 2019-04-24 ENCOUNTER — Emergency Department (HOSPITAL_COMMUNITY)
Admission: EM | Admit: 2019-04-24 | Discharge: 2019-04-24 | Disposition: A | Discharge status: Home / Self Care | Payer: BC Managed Care – PPO | Attending: Emergency Medicine | Admitting: Emergency Medicine

## 2019-04-24 ENCOUNTER — Encounter (HOSPITAL_COMMUNITY): Payer: Self-pay | Admitting: Emergency Medicine

## 2019-04-24 DIAGNOSIS — R55 Syncope and collapse: Secondary | ICD-10-CM | POA: Diagnosis not present

## 2019-04-24 DIAGNOSIS — I447 Left bundle-branch block, unspecified: Secondary | ICD-10-CM | POA: Diagnosis not present

## 2019-04-24 DIAGNOSIS — Z79899 Other long term (current) drug therapy: Secondary | ICD-10-CM | POA: Insufficient documentation

## 2019-04-24 DIAGNOSIS — I10 Essential (primary) hypertension: Secondary | ICD-10-CM | POA: Diagnosis not present

## 2019-04-24 DIAGNOSIS — Z91040 Latex allergy status: Secondary | ICD-10-CM | POA: Diagnosis not present

## 2019-04-24 DIAGNOSIS — I5032 Chronic diastolic (congestive) heart failure: Secondary | ICD-10-CM | POA: Insufficient documentation

## 2019-04-24 DIAGNOSIS — E039 Hypothyroidism, unspecified: Secondary | ICD-10-CM | POA: Insufficient documentation

## 2019-04-24 DIAGNOSIS — R42 Dizziness and giddiness: Secondary | ICD-10-CM | POA: Diagnosis not present

## 2019-04-24 DIAGNOSIS — I110 Hypertensive heart disease with heart failure: Secondary | ICD-10-CM | POA: Diagnosis not present

## 2019-04-24 DIAGNOSIS — I951 Orthostatic hypotension: Secondary | ICD-10-CM

## 2019-04-24 LAB — CBC
HCT: 37.3 % (ref 36.0–46.0)
Hemoglobin: 12.6 g/dL (ref 12.0–15.0)
MCH: 31.6 pg (ref 26.0–34.0)
MCHC: 33.8 g/dL (ref 30.0–36.0)
MCV: 93.5 fL (ref 80.0–100.0)
Platelets: 186 10*3/uL (ref 150–400)
RBC: 3.99 MIL/uL (ref 3.87–5.11)
RDW: 12.2 % (ref 11.5–15.5)
WBC: 7.8 10*3/uL (ref 4.0–10.5)
nRBC: 0 % (ref 0.0–0.2)

## 2019-04-24 LAB — BASIC METABOLIC PANEL
Anion gap: 9 (ref 5–15)
BUN: 26 mg/dL — ABNORMAL HIGH (ref 8–23)
CO2: 25 mmol/L (ref 22–32)
Calcium: 9 mg/dL (ref 8.9–10.3)
Chloride: 102 mmol/L (ref 98–111)
Creatinine, Ser: 1.28 mg/dL — ABNORMAL HIGH (ref 0.44–1.00)
GFR calc Af Amer: 49 mL/min — ABNORMAL LOW (ref >60)
GFR calc non Af Amer: 42 mL/min — ABNORMAL LOW (ref >60)
Glucose, Bld: 109 mg/dL — ABNORMAL HIGH (ref 70–99)
Potassium: 3.6 mmol/L (ref 3.5–5.1)
Sodium: 136 mmol/L (ref 135–145)

## 2019-04-24 LAB — URINALYSIS, ROUTINE W REFLEX MICROSCOPIC
Bilirubin Urine: NEGATIVE
Glucose, UA: NEGATIVE mg/dL
Hgb urine dipstick: NEGATIVE
Ketones, ur: NEGATIVE mg/dL
Nitrite: NEGATIVE
Protein, ur: NEGATIVE mg/dL
Specific Gravity, Urine: 1.015 (ref 1.005–1.030)
pH: 6 (ref 5.0–8.0)

## 2019-04-24 LAB — CBG MONITORING, ED: Glucose-Capillary: 114 mg/dL — ABNORMAL HIGH (ref 70–99)

## 2019-04-24 MED ORDER — SODIUM CHLORIDE 0.9 % IV BOLUS
1000.0000 mL | Freq: Once | INTRAVENOUS | Status: AC
  Administered 2019-04-24: 15:00:00 1000 mL via INTRAVENOUS

## 2019-04-24 NOTE — ED Provider Notes (Signed)
[facility] EMERGENCY DEPARTMENT Provider Note   CSN: [id number] Arrival date & time: 04/24/19  1200     History Chief Complaint  Patient presents with  . Near Syncope    [name] is a 74 y.o. female with past medical history of left bundle branch block, orthostatic hypotension, CHF, presenting to the emergency department with complaint of syncopal episode.  Patient states she has had recurrence of this over some time.  She is followed by Dr. Gery Pray with cardiology and had recent outpatient follow-up for this.  She states she was told she is "between a rock and a hard place" regarding managing her blood pressures.  She states they are high when she is at rest, however dropped very low when she stands up.  She had typical symptoms today with standing up, felt lightheaded and had syncopal episode.  She did not hit her head or have any injuries.  She is denying headache, vision changes, current lightheadedness, neck pain, back pain, palpitations, chest pain, shortness of breath.  The history is provided by the patient and medical records.       Past Medical History:  Diagnosis Date  . Arthritis   . Current use of estrogen therapy 03/25/2013  . Essential hypertension   . GERD (gastroesophageal reflux disease)   . H/O cardiac catheterization    2011: normal coronary arteries (false positive stress test).  . Hyperlipidemia   . Hypothyroidism   . LBBB (left bundle branch block)   . Nonischemic cardiomyopathy (HCC)   . Orthostatic hypotension    associated with syncope  . TIA (transient ischemic attack)    On Plavix    Patient Active Problem List   Diagnosis Date Noted  . Acute on chronic diastolic CHF (congestive heart failure), NYHA class 1 (HCC) 02/06/2014  . Hypertensive urgency 02/06/2014  . LBBB (left bundle branch block) 12/09/2013  . Hypothyroidism 12/09/2013  . Syncope 12/08/2013  . Orthostatic hypotension 12/07/2013  . TIA (transient ischemic attack) 12/05/2013    . Current use of estrogen therapy 03/25/2013  . Nonspecific abnormal electrocardiogram (ECG) (EKG) 01/03/2011  . HYPERLIPIDEMIA-MIXED 05/08/2010  . HYPERTENSION, BENIGN 05/08/2010  . OSTEOARTHROSIS UNSPEC WHETHER GEN/LOCALIZED HAND 01/24/2010    Past Surgical History:  Procedure Laterality Date  . ABDOMINAL HYSTERECTOMY    . COLONOSCOPY  12/25/2010   Procedure: COLONOSCOPY;  Surgeon: Malissa Hippo, MD;  Location: [facility];  Service: Endoscopy;  Laterality: N/A;  . NECK SURGERY     Secondary to ruptured disc  . PARTIAL HYSTERECTOMY    . POSTERIOR CERVICAL FUSION/FORAMINOTOMY  12/18/2011   Procedure: POSTERIOR CERVICAL FUSION/FORAMINOTOMY LEVEL 2;  Surgeon: Hewitt Shorts, MD;  Location: [facility];  Service: Neurosurgery;  Laterality: Bilateral;  Cervical four to six Posterior cervical arthrodesis  . WRIST FUSION     Right- torn ligament     OB History    Gravida  1   Para  1   Term      Preterm      AB      Living  1     SAB      TAB      Ectopic      Multiple      Live Births  1           Family History  Problem Relation Age of Onset  . Colon cancer Mother   . Lung cancer Mother   . Diabetes Father   . Cancer Brother  Back    Social History   Tobacco Use  . Smoking status: Never Smoker  . Smokeless tobacco: Never Used  Substance Use Topics  . Alcohol use: No  . Drug use: No    Home Medications Prior to Admission medications   Medication Sig Start Date End Date Taking? Authorizing Provider  alendronate (FOSAMAX) 70 MG tablet Take 70 mg by mouth once a week. Take with a full glass of water on an empty stomach.   Yes [provider]  carvedilol (COREG) 12.5 MG tablet Take 12.5 mg by mouth 2 (two) times daily with a meal.   Yes [provider]  clopidogrel (PLAVIX) 75 MG tablet Take 1 tablet (75 mg total) by mouth daily. 02/07/14  Yes Sinda Du, MD  Levothyroxine Sodium 88 MCG CAPS Take 75 mcg by mouth  daily before breakfast.    Yes [provider]  losartan (COZAAR) 100 MG tablet Take 1 tablet (100 mg total) by mouth daily. 02/07/14  Yes Sinda Du, MD  Multiple Vitamins-Minerals (CENTRUM ADULTS PO) Take 1 tablet by mouth daily.   Yes [provider]  pravastatin (PRAVACHOL) 20 MG tablet Take 1 tablet (20 mg total) by mouth daily. 12/07/13  Yes Sinda Du, MD    Allergies    Codeine, Lisinopril, Other, and Latex  Review of Systems   Review of Systems  Neurological: Positive for syncope.  All other systems reviewed and are negative.   Physical Exam Updated Vital Signs BP (!) 198/72 (BP Location: Left Arm)   Pulse 78   Temp 97.9 F (36.6 C) (Oral)   Resp 19   Ht 5\' 5"  (1.651 m)   Wt 68.9 kg   SpO2 97%   BMI 25.29 kg/m   Physical Exam Vitals and nursing note reviewed.  Constitutional:      General: She is not in acute distress.    Appearance: She is well-developed. She is not ill-appearing.  HENT:     Head: Normocephalic and atraumatic.  Eyes:     Extraocular Movements: Extraocular movements intact.     Conjunctiva/sclera: Conjunctivae normal.     Pupils: Pupils are equal, round, and reactive to light.  Cardiovascular:     Rate and Rhythm: Normal rate and regular rhythm.  Pulmonary:     Effort: Pulmonary effort is normal. No respiratory distress.     Breath sounds: Normal breath sounds.  Abdominal:     General: Bowel sounds are normal.     Palpations: Abdomen is soft.     Tenderness: There is no abdominal tenderness.  Skin:    General: Skin is warm.  Neurological:     Mental Status: She is alert.  Psychiatric:        Behavior: Behavior normal.     ED Results / Procedures / Treatments   Labs (all labs ordered are listed, but only abnormal results are displayed) Labs Reviewed  BASIC METABOLIC PANEL - Abnormal; Notable for the following components:      Result Value   Glucose, Bld 109 (*)    BUN 26 (*)    Creatinine, Ser 1.28  (*)    GFR calc non Af Amer 42 (*)    GFR calc Af Amer 49 (*)    All other components within normal limits  URINALYSIS, ROUTINE W REFLEX MICROSCOPIC - Abnormal; Notable for the following components:   APPearance HAZY (*)    Leukocytes,Ua MODERATE (*)    Bacteria, UA RARE (*)    All other  components within normal limits  CBG MONITORING, ED - Abnormal; Notable for the following components:   Glucose-Capillary 114 (*)    All other components within normal limits  CBC  CBG MONITORING, ED    EKG EKG Interpretation  Date/Time:  Sunday April 24 2019 12:06:45 EST Ventricular Rate:  77 PR Interval:    QRS Duration: 138 QT Interval:  439 QTC Calculation: 497 R Axis:   46 Text Interpretation: Sinus rhythm Left bundle branch block No STEMI Confirmed by Alona Bene [phone]) on 04/24/2019 12:21:51 PM   Radiology No results found.  Procedures Procedures (including critical care time)  Medications Ordered in ED Medications  sodium chloride 0.9 % bolus 1,000 mL (0 mLs Intravenous Stopped 04/24/19 1538)    ED Course  I have reviewed the triage vital signs and the nursing notes.  Pertinent labs & imaging results that were available during my care of the patient were reviewed by me and considered in my medical decision making (see chart for details).  Clinical Course as of Apr 23 1640  Wynelle Link Apr 24, 2019  1430 Positive orthostatics. Will administer IVF and consult cardiology for recommendations, as pt followed by Dr. Allyson Sabal with recent outpt visit regarding this orthostatic symptoms with syncope.   [JR]  1514 Dr. Duke Salvia. Compression socks, abd binder. Will send note to dr berry for close outpt f/u.   [JR]    Clinical Course User Index [JR] Cierah Crader, [place] N, PA-C   MDM Rules/Calculators/A&P                      Pt with history of orthostatic hypotension and syncope, presenting with recurrence of symptoms today.  She had syncopal episode after standing with prodrome of  lightheadedness.  No head trauma or injuries.  No chest pain, palpitations, shortness of breath, headache or vision changes.  She is asymptomatic at this time.  This is not new for her, she is followed by Dr. Gery Pray with cardiology with last appointment on 04/20/2019.  Lab work today is unremarkable.  No new arrhythmia or ischemic changes on EKG.  She is orthostatic here and given 1 L of IV fluids.  No new arrhythmia or ischemic changes on EKG.  She is orthostatic here and given 1 L of IV fluids.  Patient discussed with Dr. Duke Salvia with cardiology.  She recommends symptomatic management including compression stockings, as well as recommends patient split her dose of losartan into to 50 mg doses per day instead of 1 100 mg dose per day.  Dr. Duke Salvia to contact Dr. Gery Pray for close outpatient follow-up.  She recommends no further intervention at this time and she is appropriate for discharge as this is a chronic issue.  Discussed with patient, who is agreeable with plan and safe for discharge.  Patient discussed with Dr. Jacqulyn Bath. Final Clinical Impression(s) / ED Diagnoses Final diagnoses:  Syncope due to orthostatic hypotension    Rx / DC Orders ED Discharge Orders    None       Philipp Callegari, [place] N, PA-C 04/24/19 1644    Long, Arlyss Repress, MD 04/24/19 1845

## 2019-04-24 NOTE — Discharge Instructions (Addendum)
Please follow-up closely with your cardiologist, he is made aware of your visit today. It is recommended you split your dose of losartan into 2 doses of 50 mg every 12 hours. It is also recommended that you wear compression stockings daily. Stay hydrated.

## 2019-04-24 NOTE — ED Notes (Signed)
Call from daughter, Jasmine December [phone]  She request update then asks that mother be admitted "not matter what she wants" And be transferred to [facility]  She is gently informed that pt is competent, makes her own decisions if daughter has not been appointed her guardian, that she is concerned regarding her spouse whom she cares for and that she reports daughter is very helpful and supportive and is told what her mother reported regarding he appointment with Dr Allyson Sabal on Monday   She request to speak with clinician and asks when she can expect call- she is gently informed of pandemic, overwhelming number of patients in hospitals and ERs currently and assured that she will be called as soon as provider can call but N cannot give a time estimate

## 2019-04-24 NOTE — ED Notes (Signed)
Education  Caregivers are sometimes reluctant to self care  Dehydration from moving persons to care for them  Not drinking enough  Not resting enough   Pt reports she has no home assistance save daughter  She also reports she is still employed

## 2019-04-24 NOTE — ED Triage Notes (Signed)
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

## 2019-04-24 NOTE — ED Triage Notes (Signed)
At home   Going to [facility]  Syncope   EMS called   Pt here Monday for same   Found to have "kidney issues"

## 2019-04-25 DIAGNOSIS — R55 Syncope and collapse: Secondary | ICD-10-CM | POA: Diagnosis not present

## 2019-04-25 DIAGNOSIS — I951 Orthostatic hypotension: Secondary | ICD-10-CM | POA: Diagnosis not present

## 2019-05-06 DIAGNOSIS — R55 Syncope and collapse: Secondary | ICD-10-CM | POA: Diagnosis not present

## 2019-05-06 DIAGNOSIS — I2510 Atherosclerotic heart disease of native coronary artery without angina pectoris: Secondary | ICD-10-CM | POA: Diagnosis not present

## 2019-05-06 DIAGNOSIS — E782 Mixed hyperlipidemia: Secondary | ICD-10-CM | POA: Diagnosis not present

## 2019-05-06 DIAGNOSIS — I10 Essential (primary) hypertension: Secondary | ICD-10-CM | POA: Diagnosis not present

## 2019-05-20 DIAGNOSIS — I10 Essential (primary) hypertension: Secondary | ICD-10-CM | POA: Diagnosis not present

## 2019-05-20 DIAGNOSIS — R0602 Shortness of breath: Secondary | ICD-10-CM | POA: Diagnosis not present

## 2019-05-20 DIAGNOSIS — R55 Syncope and collapse: Secondary | ICD-10-CM | POA: Diagnosis not present

## 2019-05-30 DIAGNOSIS — R9430 Abnormal result of cardiovascular function study, unspecified: Secondary | ICD-10-CM | POA: Diagnosis not present

## 2019-06-02 DIAGNOSIS — I2510 Atherosclerotic heart disease of native coronary artery without angina pectoris: Secondary | ICD-10-CM | POA: Diagnosis not present

## 2019-06-02 DIAGNOSIS — R0602 Shortness of breath: Secondary | ICD-10-CM | POA: Diagnosis not present

## 2019-06-02 DIAGNOSIS — I10 Essential (primary) hypertension: Secondary | ICD-10-CM | POA: Diagnosis not present

## 2019-06-02 DIAGNOSIS — E782 Mixed hyperlipidemia: Secondary | ICD-10-CM | POA: Diagnosis not present

## 2019-06-06 DIAGNOSIS — E782 Mixed hyperlipidemia: Secondary | ICD-10-CM | POA: Diagnosis not present

## 2019-06-06 DIAGNOSIS — R0602 Shortness of breath: Secondary | ICD-10-CM | POA: Diagnosis not present

## 2019-06-06 DIAGNOSIS — I10 Essential (primary) hypertension: Secondary | ICD-10-CM | POA: Diagnosis not present

## 2019-06-06 DIAGNOSIS — I2510 Atherosclerotic heart disease of native coronary artery without angina pectoris: Secondary | ICD-10-CM | POA: Diagnosis not present

## 2019-06-06 DIAGNOSIS — R9430 Abnormal result of cardiovascular function study, unspecified: Secondary | ICD-10-CM | POA: Diagnosis not present

## 2019-06-16 DIAGNOSIS — Z23 Encounter for immunization: Secondary | ICD-10-CM | POA: Diagnosis not present

## 2019-07-11 DIAGNOSIS — I10 Essential (primary) hypertension: Secondary | ICD-10-CM | POA: Diagnosis not present

## 2019-07-11 DIAGNOSIS — E039 Hypothyroidism, unspecified: Secondary | ICD-10-CM | POA: Diagnosis not present

## 2019-07-11 DIAGNOSIS — E785 Hyperlipidemia, unspecified: Secondary | ICD-10-CM | POA: Diagnosis not present

## 2019-07-11 DIAGNOSIS — E782 Mixed hyperlipidemia: Secondary | ICD-10-CM | POA: Diagnosis not present

## 2019-07-11 DIAGNOSIS — M818 Other osteoporosis without current pathological fracture: Secondary | ICD-10-CM | POA: Diagnosis not present

## 2019-07-13 DIAGNOSIS — M818 Other osteoporosis without current pathological fracture: Secondary | ICD-10-CM | POA: Diagnosis not present

## 2019-07-13 DIAGNOSIS — I5022 Chronic systolic (congestive) heart failure: Secondary | ICD-10-CM | POA: Diagnosis not present

## 2019-07-13 DIAGNOSIS — I129 Hypertensive chronic kidney disease with stage 1 through stage 4 chronic kidney disease, or unspecified chronic kidney disease: Secondary | ICD-10-CM | POA: Diagnosis not present

## 2019-07-13 DIAGNOSIS — Z0001 Encounter for general adult medical examination with abnormal findings: Secondary | ICD-10-CM | POA: Diagnosis not present

## 2019-07-13 DIAGNOSIS — E039 Hypothyroidism, unspecified: Secondary | ICD-10-CM | POA: Diagnosis not present

## 2019-07-14 ENCOUNTER — Other Ambulatory Visit (HOSPITAL_COMMUNITY): Payer: Self-pay | Admitting: Internal Medicine

## 2019-07-14 DIAGNOSIS — Z13820 Encounter for screening for osteoporosis: Secondary | ICD-10-CM

## 2019-07-14 DIAGNOSIS — Z1231 Encounter for screening mammogram for malignant neoplasm of breast: Secondary | ICD-10-CM

## 2019-07-15 DIAGNOSIS — Z23 Encounter for immunization: Secondary | ICD-10-CM | POA: Diagnosis not present

## 2019-09-05 DIAGNOSIS — E782 Mixed hyperlipidemia: Secondary | ICD-10-CM | POA: Diagnosis not present

## 2019-09-05 DIAGNOSIS — I10 Essential (primary) hypertension: Secondary | ICD-10-CM | POA: Diagnosis not present

## 2019-09-05 DIAGNOSIS — R55 Syncope and collapse: Secondary | ICD-10-CM | POA: Diagnosis not present

## 2019-09-05 DIAGNOSIS — I951 Orthostatic hypotension: Secondary | ICD-10-CM | POA: Diagnosis not present

## 2019-10-12 ENCOUNTER — Other Ambulatory Visit: Payer: Self-pay

## 2019-10-12 ENCOUNTER — Ambulatory Visit (HOSPITAL_COMMUNITY)
Admission: RE | Admit: 2019-10-12 | Discharge: 2019-10-12 | Disposition: A | Discharge status: Home / Self Care | Payer: BC Managed Care – PPO | Source: Ambulatory Visit | Attending: Internal Medicine | Admitting: Internal Medicine

## 2019-10-12 DIAGNOSIS — Z780 Asymptomatic menopausal state: Secondary | ICD-10-CM | POA: Diagnosis not present

## 2019-10-12 DIAGNOSIS — Z13820 Encounter for screening for osteoporosis: Secondary | ICD-10-CM | POA: Diagnosis not present

## 2019-10-12 DIAGNOSIS — M8589 Other specified disorders of bone density and structure, multiple sites: Secondary | ICD-10-CM | POA: Diagnosis not present

## 2019-12-19 DIAGNOSIS — I10 Essential (primary) hypertension: Secondary | ICD-10-CM | POA: Diagnosis not present

## 2019-12-19 DIAGNOSIS — I2510 Atherosclerotic heart disease of native coronary artery without angina pectoris: Secondary | ICD-10-CM | POA: Diagnosis not present

## 2019-12-19 DIAGNOSIS — R0602 Shortness of breath: Secondary | ICD-10-CM | POA: Diagnosis not present

## 2019-12-19 DIAGNOSIS — E782 Mixed hyperlipidemia: Secondary | ICD-10-CM | POA: Diagnosis not present

## 2019-12-20 DIAGNOSIS — M542 Cervicalgia: Secondary | ICD-10-CM | POA: Diagnosis not present

## 2019-12-20 DIAGNOSIS — M545 Low back pain: Secondary | ICD-10-CM | POA: Diagnosis not present

## 2020-01-30 DIAGNOSIS — L820 Inflamed seborrheic keratosis: Secondary | ICD-10-CM | POA: Diagnosis not present

## 2020-01-30 DIAGNOSIS — I781 Nevus, non-neoplastic: Secondary | ICD-10-CM | POA: Diagnosis not present

## 2020-02-20 DIAGNOSIS — E785 Hyperlipidemia, unspecified: Secondary | ICD-10-CM | POA: Diagnosis not present

## 2020-02-20 DIAGNOSIS — I10 Essential (primary) hypertension: Secondary | ICD-10-CM | POA: Diagnosis not present

## 2020-02-20 DIAGNOSIS — J040 Acute laryngitis: Secondary | ICD-10-CM | POA: Diagnosis not present

## 2020-02-20 DIAGNOSIS — L989 Disorder of the skin and subcutaneous tissue, unspecified: Secondary | ICD-10-CM | POA: Diagnosis not present

## 2020-02-20 DIAGNOSIS — E782 Mixed hyperlipidemia: Secondary | ICD-10-CM | POA: Diagnosis not present

## 2020-02-20 DIAGNOSIS — I447 Left bundle-branch block, unspecified: Secondary | ICD-10-CM | POA: Diagnosis not present

## 2020-02-20 DIAGNOSIS — M818 Other osteoporosis without current pathological fracture: Secondary | ICD-10-CM | POA: Diagnosis not present

## 2020-02-20 DIAGNOSIS — E039 Hypothyroidism, unspecified: Secondary | ICD-10-CM | POA: Diagnosis not present

## 2020-03-27 DIAGNOSIS — M818 Other osteoporosis without current pathological fracture: Secondary | ICD-10-CM | POA: Diagnosis not present

## 2020-03-27 DIAGNOSIS — E039 Hypothyroidism, unspecified: Secondary | ICD-10-CM | POA: Diagnosis not present

## 2020-03-27 DIAGNOSIS — I5022 Chronic systolic (congestive) heart failure: Secondary | ICD-10-CM | POA: Diagnosis not present

## 2020-03-27 DIAGNOSIS — I129 Hypertensive chronic kidney disease with stage 1 through stage 4 chronic kidney disease, or unspecified chronic kidney disease: Secondary | ICD-10-CM | POA: Diagnosis not present

## 2020-04-23 DIAGNOSIS — I10 Essential (primary) hypertension: Secondary | ICD-10-CM | POA: Diagnosis not present

## 2020-04-23 DIAGNOSIS — R0602 Shortness of breath: Secondary | ICD-10-CM | POA: Diagnosis not present

## 2020-04-23 DIAGNOSIS — E782 Mixed hyperlipidemia: Secondary | ICD-10-CM | POA: Diagnosis not present

## 2020-04-23 DIAGNOSIS — I2510 Atherosclerotic heart disease of native coronary artery without angina pectoris: Secondary | ICD-10-CM | POA: Diagnosis not present

## 2020-07-20 DIAGNOSIS — J309 Allergic rhinitis, unspecified: Secondary | ICD-10-CM | POA: Diagnosis not present

## 2020-07-20 DIAGNOSIS — J069 Acute upper respiratory infection, unspecified: Secondary | ICD-10-CM | POA: Diagnosis not present

## 2020-08-02 DIAGNOSIS — G459 Transient cerebral ischemic attack, unspecified: Secondary | ICD-10-CM | POA: Diagnosis not present

## 2020-08-02 DIAGNOSIS — I340 Nonrheumatic mitral (valve) insufficiency: Secondary | ICD-10-CM | POA: Diagnosis not present

## 2020-08-02 DIAGNOSIS — I10 Essential (primary) hypertension: Secondary | ICD-10-CM | POA: Diagnosis not present

## 2020-09-27 DIAGNOSIS — I10 Essential (primary) hypertension: Secondary | ICD-10-CM | POA: Diagnosis not present

## 2020-09-27 DIAGNOSIS — E039 Hypothyroidism, unspecified: Secondary | ICD-10-CM | POA: Diagnosis not present

## 2020-10-01 DIAGNOSIS — I5022 Chronic systolic (congestive) heart failure: Secondary | ICD-10-CM | POA: Insufficient documentation

## 2020-10-01 DIAGNOSIS — I129 Hypertensive chronic kidney disease with stage 1 through stage 4 chronic kidney disease, or unspecified chronic kidney disease: Secondary | ICD-10-CM | POA: Insufficient documentation

## 2020-10-02 DIAGNOSIS — E039 Hypothyroidism, unspecified: Secondary | ICD-10-CM | POA: Diagnosis not present

## 2020-10-02 DIAGNOSIS — N1831 Chronic kidney disease, stage 3a: Secondary | ICD-10-CM | POA: Diagnosis not present

## 2020-10-02 DIAGNOSIS — E782 Mixed hyperlipidemia: Secondary | ICD-10-CM | POA: Diagnosis not present

## 2020-10-02 DIAGNOSIS — I129 Hypertensive chronic kidney disease with stage 1 through stage 4 chronic kidney disease, or unspecified chronic kidney disease: Secondary | ICD-10-CM | POA: Diagnosis not present

## 2020-10-02 DIAGNOSIS — I447 Left bundle-branch block, unspecified: Secondary | ICD-10-CM | POA: Diagnosis not present

## 2020-10-02 DIAGNOSIS — M810 Age-related osteoporosis without current pathological fracture: Secondary | ICD-10-CM | POA: Diagnosis not present

## 2020-10-02 DIAGNOSIS — I5022 Chronic systolic (congestive) heart failure: Secondary | ICD-10-CM | POA: Diagnosis not present

## 2020-10-02 DIAGNOSIS — M542 Cervicalgia: Secondary | ICD-10-CM | POA: Diagnosis not present

## 2020-12-18 ENCOUNTER — Encounter (INDEPENDENT_AMBULATORY_CARE_PROVIDER_SITE_OTHER): Payer: Self-pay | Admitting: *Deleted

## 2021-01-28 DIAGNOSIS — R0602 Shortness of breath: Secondary | ICD-10-CM | POA: Diagnosis not present

## 2021-01-28 DIAGNOSIS — I10 Essential (primary) hypertension: Secondary | ICD-10-CM | POA: Diagnosis not present

## 2021-01-28 DIAGNOSIS — I2510 Atherosclerotic heart disease of native coronary artery without angina pectoris: Secondary | ICD-10-CM | POA: Diagnosis not present

## 2021-01-28 DIAGNOSIS — E785 Hyperlipidemia, unspecified: Secondary | ICD-10-CM | POA: Diagnosis not present

## 2021-03-21 DIAGNOSIS — J302 Other seasonal allergic rhinitis: Secondary | ICD-10-CM | POA: Diagnosis not present

## 2021-03-29 DIAGNOSIS — I10 Essential (primary) hypertension: Secondary | ICD-10-CM | POA: Diagnosis not present

## 2021-03-29 DIAGNOSIS — E039 Hypothyroidism, unspecified: Secondary | ICD-10-CM | POA: Diagnosis not present

## 2021-04-03 DIAGNOSIS — Z0001 Encounter for general adult medical examination with abnormal findings: Secondary | ICD-10-CM | POA: Diagnosis not present

## 2021-04-03 DIAGNOSIS — E039 Hypothyroidism, unspecified: Secondary | ICD-10-CM | POA: Diagnosis not present

## 2021-04-03 DIAGNOSIS — M542 Cervicalgia: Secondary | ICD-10-CM | POA: Diagnosis not present

## 2021-04-03 DIAGNOSIS — J069 Acute upper respiratory infection, unspecified: Secondary | ICD-10-CM | POA: Diagnosis not present

## 2021-04-03 DIAGNOSIS — I5022 Chronic systolic (congestive) heart failure: Secondary | ICD-10-CM | POA: Diagnosis not present

## 2021-04-03 DIAGNOSIS — I447 Left bundle-branch block, unspecified: Secondary | ICD-10-CM | POA: Diagnosis not present

## 2021-04-03 DIAGNOSIS — M810 Age-related osteoporosis without current pathological fracture: Secondary | ICD-10-CM | POA: Diagnosis not present

## 2021-04-03 DIAGNOSIS — I129 Hypertensive chronic kidney disease with stage 1 through stage 4 chronic kidney disease, or unspecified chronic kidney disease: Secondary | ICD-10-CM | POA: Diagnosis not present

## 2021-04-03 DIAGNOSIS — N1831 Chronic kidney disease, stage 3a: Secondary | ICD-10-CM | POA: Diagnosis not present

## 2021-04-03 DIAGNOSIS — E782 Mixed hyperlipidemia: Secondary | ICD-10-CM | POA: Diagnosis not present

## 2021-04-30 DIAGNOSIS — I10 Essential (primary) hypertension: Secondary | ICD-10-CM | POA: Diagnosis not present

## 2021-05-07 DIAGNOSIS — E039 Hypothyroidism, unspecified: Secondary | ICD-10-CM | POA: Diagnosis not present

## 2021-05-07 DIAGNOSIS — I340 Nonrheumatic mitral (valve) insufficiency: Secondary | ICD-10-CM | POA: Diagnosis not present

## 2021-05-07 DIAGNOSIS — I351 Nonrheumatic aortic (valve) insufficiency: Secondary | ICD-10-CM | POA: Diagnosis not present

## 2021-05-07 DIAGNOSIS — I10 Essential (primary) hypertension: Secondary | ICD-10-CM | POA: Diagnosis not present

## 2021-05-07 DIAGNOSIS — E782 Mixed hyperlipidemia: Secondary | ICD-10-CM | POA: Diagnosis not present

## 2021-07-29 DIAGNOSIS — H9201 Otalgia, right ear: Secondary | ICD-10-CM | POA: Diagnosis not present

## 2021-07-29 DIAGNOSIS — G4700 Insomnia, unspecified: Secondary | ICD-10-CM | POA: Diagnosis not present

## 2021-07-29 DIAGNOSIS — I129 Hypertensive chronic kidney disease with stage 1 through stage 4 chronic kidney disease, or unspecified chronic kidney disease: Secondary | ICD-10-CM | POA: Diagnosis not present

## 2021-07-29 DIAGNOSIS — E039 Hypothyroidism, unspecified: Secondary | ICD-10-CM | POA: Diagnosis not present

## 2021-08-28 DIAGNOSIS — H6121 Impacted cerumen, right ear: Secondary | ICD-10-CM | POA: Diagnosis not present

## 2021-09-05 DIAGNOSIS — I351 Nonrheumatic aortic (valve) insufficiency: Secondary | ICD-10-CM | POA: Diagnosis not present

## 2021-09-05 DIAGNOSIS — I340 Nonrheumatic mitral (valve) insufficiency: Secondary | ICD-10-CM | POA: Diagnosis not present

## 2021-09-05 DIAGNOSIS — E782 Mixed hyperlipidemia: Secondary | ICD-10-CM | POA: Diagnosis not present

## 2021-09-05 DIAGNOSIS — I10 Essential (primary) hypertension: Secondary | ICD-10-CM | POA: Diagnosis not present

## 2021-09-12 DIAGNOSIS — I351 Nonrheumatic aortic (valve) insufficiency: Secondary | ICD-10-CM | POA: Diagnosis not present

## 2021-09-12 DIAGNOSIS — I2510 Atherosclerotic heart disease of native coronary artery without angina pectoris: Secondary | ICD-10-CM | POA: Diagnosis not present

## 2021-09-12 DIAGNOSIS — I340 Nonrheumatic mitral (valve) insufficiency: Secondary | ICD-10-CM | POA: Diagnosis not present

## 2021-09-12 DIAGNOSIS — E782 Mixed hyperlipidemia: Secondary | ICD-10-CM | POA: Diagnosis not present

## 2021-09-12 DIAGNOSIS — I10 Essential (primary) hypertension: Secondary | ICD-10-CM | POA: Diagnosis not present

## 2021-09-25 DIAGNOSIS — E039 Hypothyroidism, unspecified: Secondary | ICD-10-CM | POA: Diagnosis not present

## 2021-09-25 DIAGNOSIS — E782 Mixed hyperlipidemia: Secondary | ICD-10-CM | POA: Diagnosis not present

## 2021-10-02 DIAGNOSIS — N1831 Chronic kidney disease, stage 3a: Secondary | ICD-10-CM | POA: Diagnosis not present

## 2021-10-02 DIAGNOSIS — E782 Mixed hyperlipidemia: Secondary | ICD-10-CM | POA: Diagnosis not present

## 2021-10-02 DIAGNOSIS — S81801A Unspecified open wound, right lower leg, initial encounter: Secondary | ICD-10-CM | POA: Diagnosis not present

## 2021-10-02 DIAGNOSIS — E039 Hypothyroidism, unspecified: Secondary | ICD-10-CM | POA: Diagnosis not present

## 2021-10-02 DIAGNOSIS — M542 Cervicalgia: Secondary | ICD-10-CM | POA: Diagnosis not present

## 2021-10-02 DIAGNOSIS — E663 Overweight: Secondary | ICD-10-CM | POA: Diagnosis not present

## 2021-10-02 DIAGNOSIS — Z6826 Body mass index (BMI) 26.0-26.9, adult: Secondary | ICD-10-CM | POA: Diagnosis not present

## 2021-10-02 DIAGNOSIS — M810 Age-related osteoporosis without current pathological fracture: Secondary | ICD-10-CM | POA: Diagnosis not present

## 2021-10-02 DIAGNOSIS — I447 Left bundle-branch block, unspecified: Secondary | ICD-10-CM | POA: Diagnosis not present

## 2021-10-02 DIAGNOSIS — I5022 Chronic systolic (congestive) heart failure: Secondary | ICD-10-CM | POA: Diagnosis not present

## 2021-10-02 DIAGNOSIS — I129 Hypertensive chronic kidney disease with stage 1 through stage 4 chronic kidney disease, or unspecified chronic kidney disease: Secondary | ICD-10-CM | POA: Diagnosis not present

## 2021-11-12 DIAGNOSIS — R0602 Shortness of breath: Secondary | ICD-10-CM | POA: Diagnosis not present

## 2021-11-12 DIAGNOSIS — I10 Essential (primary) hypertension: Secondary | ICD-10-CM | POA: Diagnosis not present

## 2021-11-12 DIAGNOSIS — I2510 Atherosclerotic heart disease of native coronary artery without angina pectoris: Secondary | ICD-10-CM | POA: Diagnosis not present

## 2021-11-12 DIAGNOSIS — I340 Nonrheumatic mitral (valve) insufficiency: Secondary | ICD-10-CM | POA: Diagnosis not present

## 2021-11-12 DIAGNOSIS — E782 Mixed hyperlipidemia: Secondary | ICD-10-CM | POA: Diagnosis not present

## 2021-11-12 DIAGNOSIS — I351 Nonrheumatic aortic (valve) insufficiency: Secondary | ICD-10-CM | POA: Diagnosis not present

## 2021-11-19 DIAGNOSIS — I340 Nonrheumatic mitral (valve) insufficiency: Secondary | ICD-10-CM | POA: Diagnosis not present

## 2021-11-19 DIAGNOSIS — E782 Mixed hyperlipidemia: Secondary | ICD-10-CM | POA: Diagnosis not present

## 2021-11-19 DIAGNOSIS — I10 Essential (primary) hypertension: Secondary | ICD-10-CM | POA: Diagnosis not present

## 2021-11-19 DIAGNOSIS — R55 Syncope and collapse: Secondary | ICD-10-CM | POA: Diagnosis not present

## 2021-11-19 DIAGNOSIS — I351 Nonrheumatic aortic (valve) insufficiency: Secondary | ICD-10-CM | POA: Diagnosis not present

## 2021-12-06 DIAGNOSIS — M542 Cervicalgia: Secondary | ICD-10-CM | POA: Diagnosis not present

## 2021-12-06 DIAGNOSIS — I129 Hypertensive chronic kidney disease with stage 1 through stage 4 chronic kidney disease, or unspecified chronic kidney disease: Secondary | ICD-10-CM | POA: Diagnosis not present

## 2021-12-06 DIAGNOSIS — I5022 Chronic systolic (congestive) heart failure: Secondary | ICD-10-CM | POA: Diagnosis not present

## 2021-12-06 DIAGNOSIS — R55 Syncope and collapse: Secondary | ICD-10-CM | POA: Diagnosis not present

## 2021-12-06 DIAGNOSIS — I447 Left bundle-branch block, unspecified: Secondary | ICD-10-CM | POA: Diagnosis not present

## 2021-12-06 DIAGNOSIS — E039 Hypothyroidism, unspecified: Secondary | ICD-10-CM | POA: Diagnosis not present

## 2021-12-06 DIAGNOSIS — G4700 Insomnia, unspecified: Secondary | ICD-10-CM | POA: Diagnosis not present

## 2021-12-06 DIAGNOSIS — E782 Mixed hyperlipidemia: Secondary | ICD-10-CM | POA: Diagnosis not present

## 2021-12-06 DIAGNOSIS — N1831 Chronic kidney disease, stage 3a: Secondary | ICD-10-CM | POA: Diagnosis not present

## 2022-01-03 ENCOUNTER — Telehealth: Payer: Self-pay | Admitting: Orthopedic Surgery

## 2022-01-03 DIAGNOSIS — Z6825 Body mass index (BMI) 25.0-25.9, adult: Secondary | ICD-10-CM | POA: Diagnosis not present

## 2022-01-03 DIAGNOSIS — N1831 Chronic kidney disease, stage 3a: Secondary | ICD-10-CM | POA: Diagnosis not present

## 2022-01-03 DIAGNOSIS — I447 Left bundle-branch block, unspecified: Secondary | ICD-10-CM | POA: Diagnosis not present

## 2022-01-03 DIAGNOSIS — I5022 Chronic systolic (congestive) heart failure: Secondary | ICD-10-CM | POA: Diagnosis not present

## 2022-01-03 DIAGNOSIS — G4700 Insomnia, unspecified: Secondary | ICD-10-CM | POA: Diagnosis not present

## 2022-01-03 DIAGNOSIS — M542 Cervicalgia: Secondary | ICD-10-CM | POA: Diagnosis not present

## 2022-01-03 DIAGNOSIS — R55 Syncope and collapse: Secondary | ICD-10-CM | POA: Diagnosis not present

## 2022-01-03 DIAGNOSIS — I129 Hypertensive chronic kidney disease with stage 1 through stage 4 chronic kidney disease, or unspecified chronic kidney disease: Secondary | ICD-10-CM | POA: Diagnosis not present

## 2022-01-03 DIAGNOSIS — E782 Mixed hyperlipidemia: Secondary | ICD-10-CM | POA: Diagnosis not present

## 2022-01-03 DIAGNOSIS — E039 Hypothyroidism, unspecified: Secondary | ICD-10-CM | POA: Diagnosis not present

## 2022-01-03 NOTE — Telephone Encounter (Signed)
Per voice message from patient, returned call to number from message (home ph [phone]) and no answer or answer machine. Called back at 1:20pm, same. Tried work, however, not possible to reach an Human resources officer.

## 2022-01-06 DIAGNOSIS — R55 Syncope and collapse: Secondary | ICD-10-CM | POA: Diagnosis not present

## 2022-01-15 DIAGNOSIS — M5412 Radiculopathy, cervical region: Secondary | ICD-10-CM | POA: Diagnosis not present

## 2022-01-15 DIAGNOSIS — M5030 Other cervical disc degeneration, unspecified cervical region: Secondary | ICD-10-CM | POA: Diagnosis not present

## 2022-01-16 DIAGNOSIS — I10 Essential (primary) hypertension: Secondary | ICD-10-CM | POA: Diagnosis not present

## 2022-01-16 DIAGNOSIS — I340 Nonrheumatic mitral (valve) insufficiency: Secondary | ICD-10-CM | POA: Diagnosis not present

## 2022-01-16 DIAGNOSIS — I351 Nonrheumatic aortic (valve) insufficiency: Secondary | ICD-10-CM | POA: Diagnosis not present

## 2022-01-16 DIAGNOSIS — I2510 Atherosclerotic heart disease of native coronary artery without angina pectoris: Secondary | ICD-10-CM | POA: Diagnosis not present

## 2022-01-16 DIAGNOSIS — E782 Mixed hyperlipidemia: Secondary | ICD-10-CM | POA: Diagnosis not present

## 2022-01-27 ENCOUNTER — Other Ambulatory Visit: Payer: Self-pay | Admitting: Pain Medicine

## 2022-01-27 DIAGNOSIS — M5030 Other cervical disc degeneration, unspecified cervical region: Secondary | ICD-10-CM

## 2022-02-18 ENCOUNTER — Ambulatory Visit
Admission: RE | Admit: 2022-02-18 | Discharge: 2022-02-18 | Disposition: A | Discharge status: Home / Self Care | Payer: PPO | Source: Ambulatory Visit | Attending: Pain Medicine | Admitting: Pain Medicine

## 2022-02-18 DIAGNOSIS — M4802 Spinal stenosis, cervical region: Secondary | ICD-10-CM | POA: Diagnosis not present

## 2022-02-18 DIAGNOSIS — M50323 Other cervical disc degeneration at C6-C7 level: Secondary | ICD-10-CM | POA: Diagnosis not present

## 2022-02-18 DIAGNOSIS — M5030 Other cervical disc degeneration, unspecified cervical region: Secondary | ICD-10-CM

## 2022-02-18 MED ORDER — GADOPICLENOL 0.5 MMOL/ML IV SOLN
7.5000 mL | Freq: Once | INTRAVENOUS | Status: AC | PRN
  Administered 2022-02-18: 7.5 mL via INTRAVENOUS

## 2022-03-18 DIAGNOSIS — E782 Mixed hyperlipidemia: Secondary | ICD-10-CM | POA: Diagnosis not present

## 2022-03-18 DIAGNOSIS — I340 Nonrheumatic mitral (valve) insufficiency: Secondary | ICD-10-CM | POA: Diagnosis not present

## 2022-03-18 DIAGNOSIS — I351 Nonrheumatic aortic (valve) insufficiency: Secondary | ICD-10-CM | POA: Diagnosis not present

## 2022-03-18 DIAGNOSIS — I2510 Atherosclerotic heart disease of native coronary artery without angina pectoris: Secondary | ICD-10-CM | POA: Diagnosis not present

## 2022-03-18 DIAGNOSIS — I10 Essential (primary) hypertension: Secondary | ICD-10-CM | POA: Diagnosis not present

## 2022-03-19 DIAGNOSIS — M5412 Radiculopathy, cervical region: Secondary | ICD-10-CM | POA: Diagnosis not present

## 2022-03-19 DIAGNOSIS — Z6830 Body mass index (BMI) 30.0-30.9, adult: Secondary | ICD-10-CM | POA: Diagnosis not present

## 2022-03-28 DIAGNOSIS — E039 Hypothyroidism, unspecified: Secondary | ICD-10-CM | POA: Diagnosis not present

## 2022-03-28 DIAGNOSIS — E782 Mixed hyperlipidemia: Secondary | ICD-10-CM | POA: Diagnosis not present

## 2022-04-01 DIAGNOSIS — J069 Acute upper respiratory infection, unspecified: Secondary | ICD-10-CM | POA: Diagnosis not present

## 2022-04-01 DIAGNOSIS — U071 COVID-19: Secondary | ICD-10-CM | POA: Diagnosis not present

## 2022-04-04 DIAGNOSIS — E782 Mixed hyperlipidemia: Secondary | ICD-10-CM | POA: Diagnosis not present

## 2022-04-04 DIAGNOSIS — I129 Hypertensive chronic kidney disease with stage 1 through stage 4 chronic kidney disease, or unspecified chronic kidney disease: Secondary | ICD-10-CM | POA: Diagnosis not present

## 2022-04-04 DIAGNOSIS — Z0000 Encounter for general adult medical examination without abnormal findings: Secondary | ICD-10-CM | POA: Diagnosis not present

## 2022-04-04 DIAGNOSIS — I5022 Chronic systolic (congestive) heart failure: Secondary | ICD-10-CM | POA: Diagnosis not present

## 2022-04-04 DIAGNOSIS — R55 Syncope and collapse: Secondary | ICD-10-CM | POA: Diagnosis not present

## 2022-04-04 DIAGNOSIS — I447 Left bundle-branch block, unspecified: Secondary | ICD-10-CM | POA: Diagnosis not present

## 2022-04-04 DIAGNOSIS — M542 Cervicalgia: Secondary | ICD-10-CM | POA: Diagnosis not present

## 2022-04-04 DIAGNOSIS — N1831 Chronic kidney disease, stage 3a: Secondary | ICD-10-CM | POA: Diagnosis not present

## 2022-04-04 DIAGNOSIS — G4700 Insomnia, unspecified: Secondary | ICD-10-CM | POA: Diagnosis not present

## 2022-04-04 DIAGNOSIS — U071 COVID-19: Secondary | ICD-10-CM | POA: Diagnosis not present

## 2022-04-04 DIAGNOSIS — E039 Hypothyroidism, unspecified: Secondary | ICD-10-CM | POA: Diagnosis not present

## 2022-04-04 DIAGNOSIS — M810 Age-related osteoporosis without current pathological fracture: Secondary | ICD-10-CM | POA: Diagnosis not present

## 2022-04-10 DIAGNOSIS — M5412 Radiculopathy, cervical region: Secondary | ICD-10-CM | POA: Diagnosis not present

## 2022-04-11 DIAGNOSIS — I2510 Atherosclerotic heart disease of native coronary artery without angina pectoris: Secondary | ICD-10-CM | POA: Diagnosis not present

## 2022-04-11 DIAGNOSIS — I10 Essential (primary) hypertension: Secondary | ICD-10-CM | POA: Diagnosis not present

## 2022-04-11 DIAGNOSIS — R0602 Shortness of breath: Secondary | ICD-10-CM | POA: Diagnosis not present

## 2022-04-11 DIAGNOSIS — E782 Mixed hyperlipidemia: Secondary | ICD-10-CM | POA: Diagnosis not present

## 2022-04-11 DIAGNOSIS — I340 Nonrheumatic mitral (valve) insufficiency: Secondary | ICD-10-CM | POA: Diagnosis not present

## 2022-04-11 DIAGNOSIS — I351 Nonrheumatic aortic (valve) insufficiency: Secondary | ICD-10-CM | POA: Diagnosis not present

## 2022-04-17 DIAGNOSIS — G8929 Other chronic pain: Secondary | ICD-10-CM | POA: Diagnosis not present

## 2022-04-17 DIAGNOSIS — E663 Overweight: Secondary | ICD-10-CM | POA: Diagnosis not present

## 2022-04-17 DIAGNOSIS — E785 Hyperlipidemia, unspecified: Secondary | ICD-10-CM | POA: Diagnosis not present

## 2022-04-17 DIAGNOSIS — I8393 Asymptomatic varicose veins of bilateral lower extremities: Secondary | ICD-10-CM | POA: Diagnosis not present

## 2022-04-17 DIAGNOSIS — B351 Tinea unguium: Secondary | ICD-10-CM | POA: Diagnosis not present

## 2022-04-17 DIAGNOSIS — I2510 Atherosclerotic heart disease of native coronary artery without angina pectoris: Secondary | ICD-10-CM | POA: Diagnosis not present

## 2022-04-17 DIAGNOSIS — I5022 Chronic systolic (congestive) heart failure: Secondary | ICD-10-CM | POA: Diagnosis not present

## 2022-04-17 DIAGNOSIS — I6990 Unspecified sequelae of unspecified cerebrovascular disease: Secondary | ICD-10-CM | POA: Diagnosis not present

## 2022-04-17 DIAGNOSIS — N1831 Chronic kidney disease, stage 3a: Secondary | ICD-10-CM | POA: Diagnosis not present

## 2022-04-17 DIAGNOSIS — I130 Hypertensive heart and chronic kidney disease with heart failure and stage 1 through stage 4 chronic kidney disease, or unspecified chronic kidney disease: Secondary | ICD-10-CM | POA: Diagnosis not present

## 2022-04-17 DIAGNOSIS — I10 Essential (primary) hypertension: Secondary | ICD-10-CM | POA: Diagnosis not present

## 2022-04-17 DIAGNOSIS — E039 Hypothyroidism, unspecified: Secondary | ICD-10-CM | POA: Diagnosis not present

## 2022-04-25 DIAGNOSIS — I340 Nonrheumatic mitral (valve) insufficiency: Secondary | ICD-10-CM | POA: Diagnosis not present

## 2022-04-25 DIAGNOSIS — E782 Mixed hyperlipidemia: Secondary | ICD-10-CM | POA: Diagnosis not present

## 2022-04-25 DIAGNOSIS — I10 Essential (primary) hypertension: Secondary | ICD-10-CM | POA: Diagnosis not present

## 2022-04-25 DIAGNOSIS — I351 Nonrheumatic aortic (valve) insufficiency: Secondary | ICD-10-CM | POA: Diagnosis not present

## 2022-04-25 DIAGNOSIS — I2510 Atherosclerotic heart disease of native coronary artery without angina pectoris: Secondary | ICD-10-CM | POA: Diagnosis not present

## 2022-04-25 DIAGNOSIS — R55 Syncope and collapse: Secondary | ICD-10-CM | POA: Diagnosis not present

## 2022-05-06 DIAGNOSIS — N1831 Chronic kidney disease, stage 3a: Secondary | ICD-10-CM | POA: Diagnosis not present

## 2022-05-09 DIAGNOSIS — I10 Essential (primary) hypertension: Secondary | ICD-10-CM | POA: Diagnosis not present

## 2022-05-09 DIAGNOSIS — I351 Nonrheumatic aortic (valve) insufficiency: Secondary | ICD-10-CM | POA: Diagnosis not present

## 2022-05-09 DIAGNOSIS — E782 Mixed hyperlipidemia: Secondary | ICD-10-CM | POA: Diagnosis not present

## 2022-05-09 DIAGNOSIS — I340 Nonrheumatic mitral (valve) insufficiency: Secondary | ICD-10-CM | POA: Diagnosis not present

## 2022-05-09 DIAGNOSIS — I2510 Atherosclerotic heart disease of native coronary artery without angina pectoris: Secondary | ICD-10-CM | POA: Diagnosis not present

## 2022-05-12 DIAGNOSIS — H40013 Open angle with borderline findings, low risk, bilateral: Secondary | ICD-10-CM | POA: Diagnosis not present

## 2022-05-29 DIAGNOSIS — H26493 Other secondary cataract, bilateral: Secondary | ICD-10-CM | POA: Diagnosis not present

## 2022-05-29 DIAGNOSIS — H26491 Other secondary cataract, right eye: Secondary | ICD-10-CM | POA: Diagnosis not present

## 2022-05-29 DIAGNOSIS — H524 Presbyopia: Secondary | ICD-10-CM | POA: Diagnosis not present

## 2022-05-29 DIAGNOSIS — H35361 Drusen (degenerative) of macula, right eye: Secondary | ICD-10-CM | POA: Diagnosis not present

## 2022-05-29 DIAGNOSIS — H40013 Open angle with borderline findings, low risk, bilateral: Secondary | ICD-10-CM | POA: Diagnosis not present

## 2022-05-29 DIAGNOSIS — H43393 Other vitreous opacities, bilateral: Secondary | ICD-10-CM | POA: Diagnosis not present

## 2022-06-10 ENCOUNTER — Ambulatory Visit: Payer: PPO | Admitting: Cardiovascular Disease

## 2022-06-10 ENCOUNTER — Encounter: Payer: Self-pay | Admitting: Cardiovascular Disease

## 2022-06-10 VITALS — BP 130/80 | HR 80 | Ht 64.0 in | Wt 155.8 lb

## 2022-06-10 DIAGNOSIS — I5033 Acute on chronic diastolic (congestive) heart failure: Secondary | ICD-10-CM | POA: Diagnosis not present

## 2022-06-10 DIAGNOSIS — E782 Mixed hyperlipidemia: Secondary | ICD-10-CM | POA: Diagnosis not present

## 2022-06-10 DIAGNOSIS — G459 Transient cerebral ischemic attack, unspecified: Secondary | ICD-10-CM

## 2022-06-10 DIAGNOSIS — R42 Dizziness and giddiness: Secondary | ICD-10-CM | POA: Diagnosis not present

## 2022-06-10 DIAGNOSIS — J810 Acute pulmonary edema: Secondary | ICD-10-CM

## 2022-06-10 DIAGNOSIS — I447 Left bundle-branch block, unspecified: Secondary | ICD-10-CM | POA: Diagnosis not present

## 2022-06-10 DIAGNOSIS — I10 Essential (primary) hypertension: Secondary | ICD-10-CM | POA: Diagnosis not present

## 2022-06-10 NOTE — Progress Notes (Signed)
Cardiology Office Note   Date:  06/10/2022   ID:  Hurley, Bosen 1945/06/16, MRN [id number]  PCP:  Celene Squibb, MD  Cardiologist:  Neoma Laming, MD      History of Present Illness: [name] is a 77 y.o. female who presents for  Chief Complaint  Patient presents with   Follow-up    1 month fu    Dizziness This is a recurrent problem. The current episode started in the past 7 days. The problem has been resolved.      Past Medical History:  Diagnosis Date   Arthritis    Current use of estrogen therapy 03/25/2013   Essential hypertension    GERD (gastroesophageal reflux disease)    H/O cardiac catheterization    2011: normal coronary arteries (false positive stress test).   Hyperlipidemia    Hypothyroidism    LBBB (left bundle branch block)    Nonischemic cardiomyopathy (HCC)    Orthostatic hypotension    associated with syncope   TIA (transient ischemic attack)    On Plavix     Past Surgical History:  Procedure Laterality Date   ABDOMINAL HYSTERECTOMY     COLONOSCOPY  12/25/2010   Procedure: COLONOSCOPY;  Surgeon: Rogene Houston, MD;  Location: [facility];  Service: Endoscopy;  Laterality: N/A;   NECK SURGERY     Secondary to ruptured disc   PARTIAL HYSTERECTOMY     POSTERIOR CERVICAL FUSION/FORAMINOTOMY  12/18/2011   Procedure: POSTERIOR CERVICAL FUSION/FORAMINOTOMY LEVEL 2;  Surgeon: Hosie Spangle, MD;  Location: [place] [facility]URO ORS;  Service: Neurosurgery;  Laterality: Bilateral;  Cervical four to six Posterior cervical arthrodesis   WRIST FUSION     Right- torn ligament     Current Outpatient Medications  Medication Sig Dispense Refill   carvedilol (COREG) 12.5 MG tablet Take 12.5 mg by mouth 2 (two) times daily with a meal.     clopidogrel (PLAVIX) 75 MG tablet Take 1 tablet (75 mg total) by mouth daily. 30 tablet 12   Levothyroxine Sodium 88 MCG CAPS Take 75 mcg by mouth daily before breakfast.      losartan (COZAAR) 100 MG tablet Take  1 tablet (100 mg total) by mouth daily. 30 tablet 12   Multiple Vitamins-Minerals (CENTRUM ADULTS PO) Take 1 tablet by mouth daily.     pravastatin (PRAVACHOL) 20 MG tablet Take 1 tablet (20 mg total) by mouth daily. 30 tablet 12   amLODipine (NORVASC) 5 MG tablet Take 1 tablet by mouth daily.     No current facility-administered medications for this visit.    Allergies:   Codeine, Lisinopril, and Other    Social History:   reports that she has never smoked. She has never used smokeless tobacco. She reports that she does not drink alcohol and does not use drugs.   Family History:  family history includes Cancer in her brother; Colon cancer in her mother; Diabetes in her father; Lung cancer in her mother.    ROS:     Review of Systems  Constitutional: Negative.   HENT: Negative.    Eyes: Negative.   Respiratory: Negative.    Gastrointestinal: Negative.   Genitourinary: Negative.   Musculoskeletal: Negative.   Skin: Negative.   Neurological:  Positive for dizziness.  Endo/Heme/Allergies: Negative.   Psychiatric/Behavioral: Negative.    All other systems reviewed and are negative.     All other systems are reviewed and negative.    PHYSICAL EXAM: VS:  BP 130/80   Pulse 80   Ht 5' 4"$  (1.626 m)   Wt 155 lb 12.8 oz (70.7 kg)   SpO2 97%   BMI 26.74 kg/m  , BMI Body mass index is 26.74 kg/m. Last weight:  Wt Readings from Last 3 Encounters:  06/10/22 155 lb 12.8 oz (70.7 kg)  04/24/19 152 lb (68.9 kg)  04/20/19 152 lb (68.9 kg)     Physical Exam Constitutional:      Appearance: Normal appearance.  Cardiovascular:     Rate and Rhythm: Normal rate and regular rhythm.     Heart sounds: Normal heart sounds.  Pulmonary:     Effort: Pulmonary effort is normal.     Breath sounds: Normal breath sounds.  Musculoskeletal:     Right lower leg: No edema.     Left lower leg: No edema.  Neurological:     Mental Status: She is alert.     EKG: none today  Recent  Labs: No results found for requested labs within last 365 days.    Lipid Panel    Component Value Date/Time   CHOL 140 02/06/2014 0616   TRIG 100 02/06/2014 0616   HDL 66 02/06/2014 0616   CHOLHDL 2.1 02/06/2014 0616   VLDL 20 02/06/2014 0616   LDLCALC 54 02/06/2014 0616      Other studies Reviewed:  REASON FOR VISIT  Visit for: Echocardiogram/R55  Sex:   Female   wt=  150  lbs.  BP=96/56  Height= 65   inches.        TESTS  Imaging: Echocardiogram:  An echocardiogram in (2-d) mode was performed and in Doppler mode with color flow velocity mapping was performed. The aortic valve cusps are abnormal 1.2  cm, flow velocity 1.50   m/s, and systolic calculated mean flow gradient 5  mmHg. Mitral valve diastolic peak flow velocity E .859    m/s and E/A ratio 1.0. Aortic root diameter 2.5   cm. The LVOT internal diameter 2.1 cm and flow velocity was abnormal .812   m/s. LV systolic dimension 99991111  cm, diastolic A999333  cm, posterior wall thickness 1.48    cm, fractional shortening 31.8 %, and EF 60.7%. IVS thickness 1.58 cm. LA dimension 4.3 cm. Tricuspid Valve has Moderate Regurgitation. Mitral Valve has Moderate Regurgitation. Pulmonic Valve has Trace Regurgitation.     ASSESSMENT  Technically adequate study.  Normal left ventricular systolic function.  Mild left ventricular hypertrophy with GRADE 3 (resrictive physiology) diastolic dysfunction.  Normal right ventricular systolic function.  Normal right ventricular diastolic function.  Normal left ventricular wall motion.  Normal right ventricular wall motion.  Trace pulmonary regurgitation.  Moderate tricuspid regurgitation.  Normal pulmonary artery pressure.  Moderate mitral regurgitation.  Mildly dilated Left and Right atrium.  Mild LVH.     THERAPY   Referring physician: Dionisio David  Sonographer: Neoma Laming.    Neoma Laming MD  Electronically signed by: Neoma Laming     Date: 01/06/2022  13:19  ASSESSMENT AND PLAN:    ICD-10-CM   1. Dizziness  R42    Feeling much better    2. Mixed hyperlipidemia  E78.2     3. Primary hypertension  I10     4. Acute on chronic diastolic CHF (congestive heart failure), NYHA class 1 (HCC)  I50.33     5. HYPERTENSION, BENIGN  I10     6. LBBB (left bundle branch block)  I44.7     7. TIA (transient  ischemic attack)  G45.9     8. Pulmonary edema, acute (HCC)  J81.0        Problem List Items Addressed This Visit       Cardiovascular and Mediastinum   HYPERTENSION, BENIGN (Chronic)   Relevant Medications   amLODipine (NORVASC) 5 MG tablet   LBBB (left bundle branch block) (Chronic)   Relevant Medications   amLODipine (NORVASC) 5 MG tablet   TIA (transient ischemic attack)   Relevant Medications   amLODipine (NORVASC) 5 MG tablet   Acute on chronic diastolic CHF (congestive heart failure), NYHA class 1 (HCC)   Relevant Medications   amLODipine (NORVASC) 5 MG tablet     Respiratory   Pulmonary edema, acute (HCC)   Other Visit Diagnoses     Dizziness    -  Primary   Feeling much better   Mixed hyperlipidemia       Relevant Medications   amLODipine (NORVASC) 5 MG tablet   Primary hypertension       Relevant Medications   amLODipine (NORVASC) 5 MG tablet        Disposition:   Return in about 3 months (around 09/08/2022).    Total time spent: 30 minutes  Signed,  Neoma Laming, MD  06/10/2022 1:40 PM    [place]

## 2022-06-17 DIAGNOSIS — I447 Left bundle-branch block, unspecified: Secondary | ICD-10-CM | POA: Diagnosis not present

## 2022-06-17 DIAGNOSIS — N1831 Chronic kidney disease, stage 3a: Secondary | ICD-10-CM | POA: Diagnosis not present

## 2022-06-17 DIAGNOSIS — G4700 Insomnia, unspecified: Secondary | ICD-10-CM | POA: Diagnosis not present

## 2022-06-17 DIAGNOSIS — I129 Hypertensive chronic kidney disease with stage 1 through stage 4 chronic kidney disease, or unspecified chronic kidney disease: Secondary | ICD-10-CM | POA: Diagnosis not present

## 2022-06-17 DIAGNOSIS — M542 Cervicalgia: Secondary | ICD-10-CM | POA: Diagnosis not present

## 2022-06-17 DIAGNOSIS — I5022 Chronic systolic (congestive) heart failure: Secondary | ICD-10-CM | POA: Diagnosis not present

## 2022-06-18 DIAGNOSIS — H26492 Other secondary cataract, left eye: Secondary | ICD-10-CM | POA: Diagnosis not present

## 2022-07-01 DIAGNOSIS — C44529 Squamous cell carcinoma of skin of other part of trunk: Secondary | ICD-10-CM | POA: Diagnosis not present

## 2022-07-19 ENCOUNTER — Other Ambulatory Visit: Payer: Self-pay | Admitting: Cardiovascular Disease

## 2022-07-19 DIAGNOSIS — R55 Syncope and collapse: Secondary | ICD-10-CM

## 2022-07-23 ENCOUNTER — Other Ambulatory Visit: Payer: Self-pay | Admitting: Cardiovascular Disease

## 2022-08-12 DIAGNOSIS — Z08 Encounter for follow-up examination after completed treatment for malignant neoplasm: Secondary | ICD-10-CM | POA: Diagnosis not present

## 2022-08-12 DIAGNOSIS — X32XXXD Exposure to sunlight, subsequent encounter: Secondary | ICD-10-CM | POA: Diagnosis not present

## 2022-08-12 DIAGNOSIS — D0462 Carcinoma in situ of skin of left upper limb, including shoulder: Secondary | ICD-10-CM | POA: Diagnosis not present

## 2022-08-12 DIAGNOSIS — L570 Actinic keratosis: Secondary | ICD-10-CM | POA: Diagnosis not present

## 2022-08-12 DIAGNOSIS — D0461 Carcinoma in situ of skin of right upper limb, including shoulder: Secondary | ICD-10-CM | POA: Diagnosis not present

## 2022-08-12 DIAGNOSIS — Z85828 Personal history of other malignant neoplasm of skin: Secondary | ICD-10-CM | POA: Diagnosis not present

## 2022-09-08 ENCOUNTER — Encounter: Payer: Self-pay | Admitting: Cardiovascular Disease

## 2022-09-08 ENCOUNTER — Ambulatory Visit (INDEPENDENT_AMBULATORY_CARE_PROVIDER_SITE_OTHER): Payer: PPO | Admitting: Cardiovascular Disease

## 2022-09-08 VITALS — BP 108/52 | HR 97 | Ht 65.0 in | Wt 153.6 lb

## 2022-09-08 DIAGNOSIS — I5022 Chronic systolic (congestive) heart failure: Secondary | ICD-10-CM

## 2022-09-08 DIAGNOSIS — I10 Essential (primary) hypertension: Secondary | ICD-10-CM | POA: Diagnosis not present

## 2022-09-08 DIAGNOSIS — E782 Mixed hyperlipidemia: Secondary | ICD-10-CM | POA: Diagnosis not present

## 2022-09-08 NOTE — Assessment & Plan Note (Signed)
Blood pressure well controlled. Dizziness improved. Continue same medications.

## 2022-09-08 NOTE — Assessment & Plan Note (Signed)
Echo 12/2021 EF 60%, grade III DD. Gets short of breath when moving grass.

## 2022-09-08 NOTE — Assessment & Plan Note (Signed)
Followed by PCP, on pravastatin 20 mg daily.

## 2022-09-08 NOTE — Progress Notes (Signed)
Cardiology Office Note   Date:  09/08/2022   ID:  Laura, [name] 06-Aug-1945, MRN [id number]  PCP:  Benita Stabile, MD  Cardiologist:  Adrian Blackwater, MD      History of Present Illness: [name] is a 77 y.o. female who presents for  Chief Complaint  Patient presents with   Follow-up    3 month follow up    Patient in office for routine cardiac exam. Denies chest pain, shortness of breath, edema, palpitations.     Past Medical History:  Diagnosis Date   Arthritis    Current use of estrogen therapy 03/25/2013   Essential hypertension    GERD (gastroesophageal reflux disease)    H/O cardiac catheterization    2011: normal coronary arteries (false positive stress test).   Hyperlipidemia    Hypothyroidism    LBBB (left bundle branch block)    Nonischemic cardiomyopathy (HCC)    Orthostatic hypotension    associated with syncope   TIA (transient ischemic attack)    On Plavix     Past Surgical History:  Procedure Laterality Date   ABDOMINAL HYSTERECTOMY     COLONOSCOPY  12/25/2010   Procedure: COLONOSCOPY;  Surgeon: Malissa Hippo, MD;  Location: [facility];  Service: Endoscopy;  Laterality: N/A;   NECK SURGERY     Secondary to ruptured disc   PARTIAL HYSTERECTOMY     POSTERIOR CERVICAL FUSION/FORAMINOTOMY  12/18/2011   Procedure: POSTERIOR CERVICAL FUSION/FORAMINOTOMY LEVEL 2;  Surgeon: Hewitt Shorts, MD;  Location: [facility];  Service: Neurosurgery;  Laterality: Bilateral;  Cervical four to six Posterior cervical arthrodesis   WRIST FUSION     Right- torn ligament     Current Outpatient Medications  Medication Sig Dispense Refill   amLODipine (NORVASC) 5 MG tablet Take 1 tablet by mouth daily.     clopidogrel (PLAVIX) 75 MG tablet Take 1 tablet (75 mg total) by mouth daily. 30 tablet 12   Levothyroxine Sodium 88 MCG CAPS Take 75 mcg by mouth daily before breakfast.      losartan-hydrochlorothiazide (HYZAAR) 50-12.5 MG tablet TAKE 1 TABLET BY  MOUTH EVERY DAY 90 tablet 0   metoprolol tartrate (LOPRESSOR) 25 MG tablet TAKE 1 TABLET BY MOUTH TWICE A DAY 180 tablet 1   Multiple Vitamins-Minerals (CENTRUM ADULTS PO) Take 1 tablet by mouth daily.     pravastatin (PRAVACHOL) 20 MG tablet Take 1 tablet (20 mg total) by mouth daily. 30 tablet 12   No current facility-administered medications for this visit.    Allergies:   Codeine, Lisinopril, and Other    Social History:   reports that she has never smoked. She has never used smokeless tobacco. She reports that she does not drink alcohol and does not use drugs.   Family History:  family history includes Cancer in her brother; Colon cancer in her mother; Diabetes in her father; Lung cancer in her mother.    ROS:     Review of Systems  Constitutional: Negative.   HENT: Negative.    Eyes: Negative.   Respiratory: Negative.    Cardiovascular: Negative.   Gastrointestinal: Negative.   Genitourinary: Negative.   Musculoskeletal: Negative.   Skin: Negative.   Neurological: Negative.   Endo/Heme/Allergies: Negative.   Psychiatric/Behavioral: Negative.    All other systems reviewed and are negative.    All other systems are reviewed and negative.    PHYSICAL EXAM: VS:  BP (!) 108/52 (BP Location: Right Arm,  Patient Position: Sitting, Cuff Size: Small)   Pulse 97   Ht 5\' 5"  (1.651 m)   Wt 153 lb 9.6 oz (69.7 kg)   SpO2 96%   BMI 25.56 kg/m  , BMI Body mass index is 25.56 kg/m. Last weight:  Wt Readings from Last 3 Encounters:  09/08/22 153 lb 9.6 oz (69.7 kg)  06/10/22 155 lb 12.8 oz (70.7 kg)  04/24/19 152 lb (68.9 kg)    Physical Exam Constitutional:      Appearance: Normal appearance.  Cardiovascular:     Rate and Rhythm: Normal rate and regular rhythm.     Heart sounds: Normal heart sounds.  Pulmonary:     Effort: Pulmonary effort is normal.     Breath sounds: Normal breath sounds.  Musculoskeletal:     Right lower leg: No edema.     Left lower leg: No  edema.  Neurological:     Mental Status: She is alert.    EKG: none today  Recent Labs: No results found for requested labs within last 365 days.    Lipid Panel    Component Value Date/Time   CHOL 140 02/06/2014 0616   TRIG 100 02/06/2014 0616   HDL 66 02/06/2014 0616   CHOLHDL 2.1 02/06/2014 0616   VLDL 20 02/06/2014 0616   LDLCALC 54 02/06/2014 0616     ASSESSMENT AND PLAN:    ICD-10-CM   1. HYPERTENSION, BENIGN  I10     2. Chronic systolic heart failure (HCC)  Z61.09     3. Mixed hyperlipidemia  E78.2        Problem List Items Addressed This Visit       Cardiovascular and Mediastinum   HYPERTENSION, BENIGN - Primary (Chronic)    Blood pressure well controlled. Dizziness improved. Continue same medications.      Chronic systolic heart failure (HCC)    Echo 12/2021 EF 60%, grade III DD. Gets short of breath when moving grass.         Other   Hyperlipidemia    Followed by PCP, on pravastatin 20 mg daily.         Disposition:   Return in about 4 months (around 01/09/2023).    Total time spent: 30 minutes  Signed,  Adrian Blackwater, MD  09/08/2022 10:32 AM    [facility]

## 2022-09-11 ENCOUNTER — Other Ambulatory Visit: Payer: Self-pay | Admitting: Nurse Practitioner

## 2022-09-23 DIAGNOSIS — L570 Actinic keratosis: Secondary | ICD-10-CM | POA: Diagnosis not present

## 2022-09-23 DIAGNOSIS — Z85828 Personal history of other malignant neoplasm of skin: Secondary | ICD-10-CM | POA: Diagnosis not present

## 2022-09-23 DIAGNOSIS — Z08 Encounter for follow-up examination after completed treatment for malignant neoplasm: Secondary | ICD-10-CM | POA: Diagnosis not present

## 2022-09-23 DIAGNOSIS — X32XXXD Exposure to sunlight, subsequent encounter: Secondary | ICD-10-CM | POA: Diagnosis not present

## 2022-09-23 DIAGNOSIS — E782 Mixed hyperlipidemia: Secondary | ICD-10-CM | POA: Diagnosis not present

## 2022-09-23 DIAGNOSIS — E039 Hypothyroidism, unspecified: Secondary | ICD-10-CM | POA: Diagnosis not present

## 2022-09-29 ENCOUNTER — Other Ambulatory Visit (HOSPITAL_COMMUNITY): Payer: Self-pay | Admitting: Internal Medicine

## 2022-09-29 ENCOUNTER — Encounter: Payer: Self-pay | Admitting: Internal Medicine

## 2022-09-29 DIAGNOSIS — Z1231 Encounter for screening mammogram for malignant neoplasm of breast: Secondary | ICD-10-CM

## 2022-09-29 DIAGNOSIS — M810 Age-related osteoporosis without current pathological fracture: Secondary | ICD-10-CM

## 2022-09-29 DIAGNOSIS — N1831 Chronic kidney disease, stage 3a: Secondary | ICD-10-CM | POA: Diagnosis not present

## 2022-09-29 DIAGNOSIS — I129 Hypertensive chronic kidney disease with stage 1 through stage 4 chronic kidney disease, or unspecified chronic kidney disease: Secondary | ICD-10-CM | POA: Diagnosis not present

## 2022-09-29 DIAGNOSIS — I130 Hypertensive heart and chronic kidney disease with heart failure and stage 1 through stage 4 chronic kidney disease, or unspecified chronic kidney disease: Secondary | ICD-10-CM | POA: Diagnosis not present

## 2022-09-29 DIAGNOSIS — E039 Hypothyroidism, unspecified: Secondary | ICD-10-CM | POA: Diagnosis not present

## 2022-09-29 DIAGNOSIS — E782 Mixed hyperlipidemia: Secondary | ICD-10-CM | POA: Diagnosis not present

## 2022-09-29 DIAGNOSIS — M542 Cervicalgia: Secondary | ICD-10-CM | POA: Diagnosis not present

## 2022-09-29 DIAGNOSIS — R55 Syncope and collapse: Secondary | ICD-10-CM | POA: Diagnosis not present

## 2022-09-29 DIAGNOSIS — N183 Chronic kidney disease, stage 3 unspecified: Secondary | ICD-10-CM | POA: Diagnosis not present

## 2022-09-29 DIAGNOSIS — I5022 Chronic systolic (congestive) heart failure: Secondary | ICD-10-CM | POA: Diagnosis not present

## 2022-09-29 DIAGNOSIS — G4700 Insomnia, unspecified: Secondary | ICD-10-CM | POA: Diagnosis not present

## 2022-09-29 DIAGNOSIS — I447 Left bundle-branch block, unspecified: Secondary | ICD-10-CM | POA: Diagnosis not present

## 2022-10-08 DIAGNOSIS — N1831 Chronic kidney disease, stage 3a: Secondary | ICD-10-CM | POA: Diagnosis not present

## 2022-10-20 ENCOUNTER — Ambulatory Visit (HOSPITAL_COMMUNITY)
Admission: RE | Admit: 2022-10-20 | Discharge: 2022-10-20 | Disposition: A | Discharge status: Home / Self Care | Payer: PPO | Source: Ambulatory Visit | Attending: Internal Medicine | Admitting: Internal Medicine

## 2022-10-20 ENCOUNTER — Telehealth: Payer: Self-pay

## 2022-10-20 DIAGNOSIS — Z1231 Encounter for screening mammogram for malignant neoplasm of breast: Secondary | ICD-10-CM | POA: Insufficient documentation

## 2022-10-20 DIAGNOSIS — Z780 Asymptomatic menopausal state: Secondary | ICD-10-CM | POA: Diagnosis not present

## 2022-10-20 DIAGNOSIS — M810 Age-related osteoporosis without current pathological fracture: Secondary | ICD-10-CM | POA: Diagnosis not present

## 2022-10-20 DIAGNOSIS — M85852 Other specified disorders of bone density and structure, left thigh: Secondary | ICD-10-CM | POA: Diagnosis not present

## 2022-10-20 DIAGNOSIS — M85851 Other specified disorders of bone density and structure, right thigh: Secondary | ICD-10-CM | POA: Diagnosis not present

## 2022-10-20 DIAGNOSIS — M85831 Other specified disorders of bone density and structure, right forearm: Secondary | ICD-10-CM | POA: Diagnosis not present

## 2022-10-20 NOTE — Telephone Encounter (Signed)
Patient LM about having BP issues, she will need to female appt with cards for BP issues

## 2022-10-24 ENCOUNTER — Encounter: Payer: Self-pay | Admitting: Cardiovascular Disease

## 2022-10-24 ENCOUNTER — Ambulatory Visit (INDEPENDENT_AMBULATORY_CARE_PROVIDER_SITE_OTHER): Payer: PPO | Admitting: Cardiovascular Disease

## 2022-10-24 VITALS — BP 162/98 | HR 59 | Ht 65.0 in | Wt 154.6 lb

## 2022-10-24 DIAGNOSIS — I447 Left bundle-branch block, unspecified: Secondary | ICD-10-CM | POA: Diagnosis not present

## 2022-10-24 DIAGNOSIS — I10 Essential (primary) hypertension: Secondary | ICD-10-CM

## 2022-10-24 DIAGNOSIS — I5033 Acute on chronic diastolic (congestive) heart failure: Secondary | ICD-10-CM

## 2022-10-24 DIAGNOSIS — G459 Transient cerebral ischemic attack, unspecified: Secondary | ICD-10-CM

## 2022-10-24 MED ORDER — LOSARTAN POTASSIUM 100 MG PO TABS: 100.0000 mg | ORAL_TABLET | Freq: Every day | ORAL | 11 refills | Status: DC

## 2022-10-24 NOTE — Progress Notes (Signed)
Cardiology Office Note   Date:  10/24/2022   ID:  Yurie, Jeck 11/25/1945, MRN [id number]  PCP:  Benita Stabile, MD  Cardiologist:  Adrian Blackwater, MD      History of Present Illness: [name] is a 77 y.o. female who presents for  Chief Complaint  Patient presents with   Follow-up    Blood pressure issues    Feeling fine, take losartan at night and BP is high, feels clammy.      Past Medical History:  Diagnosis Date   Arthritis    Current use of estrogen therapy 03/25/2013   Essential hypertension    GERD (gastroesophageal reflux disease)    H/O cardiac catheterization    2011: normal coronary arteries (false positive stress test).   Hyperlipidemia    Hypothyroidism    LBBB (left bundle branch block)    Nonischemic cardiomyopathy (HCC)    Orthostatic hypotension    associated with syncope   TIA (transient ischemic attack)    On Plavix     Past Surgical History:  Procedure Laterality Date   ABDOMINAL HYSTERECTOMY     COLONOSCOPY  12/25/2010   Procedure: COLONOSCOPY;  Surgeon: Malissa Hippo, MD;  Location: [facility];  Service: Endoscopy;  Laterality: N/A;   NECK SURGERY     Secondary to ruptured disc   PARTIAL HYSTERECTOMY     POSTERIOR CERVICAL FUSION/FORAMINOTOMY  12/18/2011   Procedure: POSTERIOR CERVICAL FUSION/FORAMINOTOMY LEVEL 2;  Surgeon: Hewitt Shorts, MD;  Location: [facility];  Service: Neurosurgery;  Laterality: Bilateral;  Cervical four to six Posterior cervical arthrodesis   WRIST FUSION     Right- torn ligament     Current Outpatient Medications  Medication Sig Dispense Refill   amLODipine (NORVASC) 5 MG tablet Take 1 tablet by mouth daily.     clopidogrel (PLAVIX) 75 MG tablet Take 1 tablet (75 mg total) by mouth daily. 30 tablet 12   Levothyroxine Sodium 88 MCG CAPS Take 75 mcg by mouth daily before breakfast.      losartan (COZAAR) 100 MG tablet Take 1 tablet (100 mg total) by mouth daily. 30 tablet 11   metoprolol  tartrate (LOPRESSOR) 25 MG tablet TAKE 1 TABLET BY MOUTH TWICE A DAY 180 tablet 1   Multiple Vitamins-Minerals (CENTRUM ADULTS PO) Take 1 tablet by mouth daily.     pravastatin (PRAVACHOL) 20 MG tablet Take 1 tablet (20 mg total) by mouth daily. 30 tablet 12   No current facility-administered medications for this visit.    Allergies:   Codeine, Lisinopril, and Other    Social History:   reports that she has never smoked. She has never used smokeless tobacco. She reports that she does not drink alcohol and does not use drugs.   Family History:  family history includes Cancer in her brother; Colon cancer in her mother; Diabetes in her father; Lung cancer in her mother.    ROS:     Review of Systems  Constitutional: Negative.   HENT: Negative.    Eyes: Negative.   Respiratory: Negative.    Gastrointestinal: Negative.   Genitourinary: Negative.   Musculoskeletal: Negative.   Skin: Negative.   Neurological: Negative.   Endo/Heme/Allergies: Negative.   Psychiatric/Behavioral: Negative.    All other systems reviewed and are negative.     All other systems are reviewed and negative.    PHYSICAL EXAM: VS:  BP (!) 162/98   Pulse (!) 59   Ht  5\' 5"  (1.651 m)   Wt 154 lb 9.6 oz (70.1 kg)   SpO2 96%   BMI 25.73 kg/m  , BMI Body mass index is 25.73 kg/m. Last weight:  Wt Readings from Last 3 Encounters:  10/24/22 154 lb 9.6 oz (70.1 kg)  09/08/22 153 lb 9.6 oz (69.7 kg)  06/10/22 155 lb 12.8 oz (70.7 kg)     Physical Exam Constitutional:      Appearance: Normal appearance.  Cardiovascular:     Rate and Rhythm: Normal rate and regular rhythm.     Heart sounds: Normal heart sounds.  Pulmonary:     Effort: Pulmonary effort is normal.     Breath sounds: Normal breath sounds.  Musculoskeletal:     Right lower leg: No edema.     Left lower leg: No edema.  Neurological:     Mental Status: She is alert.       EKG:   Recent Labs: No results found for requested labs  within last 365 days.    Lipid Panel    Component Value Date/Time   CHOL 140 02/06/2014 0616   TRIG 100 02/06/2014 0616   HDL 66 02/06/2014 0616   CHOLHDL 2.1 02/06/2014 0616   VLDL 20 02/06/2014 0616   LDLCALC 54 02/06/2014 0616      Other studies Reviewed: Additional studies/ records that were reviewed today include:  Review of the above records demonstrates:       No data to display            ASSESSMENT AND PLAN:    ICD-10-CM   1. Acute on chronic diastolic CHF (congestive heart failure), NYHA class 1 (HCC)  I50.33 losartan (COZAAR) 100 MG tablet    PCV ECHOCARDIOGRAM COMPLETE    2. HYPERTENSION, BENIGN  I10 losartan (COZAAR) 100 MG tablet    PCV ECHOCARDIOGRAM COMPLETE   repeat BP 150/90, bt at home BPS over 200 after changing hyzaar to losartan 50. Will increase losartan 100 and f/u 1 weeek after echo. Creat 1.23.    3. TIA (transient ischemic attack)  G45.9 losartan (COZAAR) 100 MG tablet    PCV ECHOCARDIOGRAM COMPLETE    4. LBBB (left bundle branch block)  I44.7 losartan (COZAAR) 100 MG tablet    PCV ECHOCARDIOGRAM COMPLETE       Problem List Items Addressed This Visit       Cardiovascular and Mediastinum   HYPERTENSION, BENIGN (Chronic)   Relevant Medications   losartan (COZAAR) 100 MG tablet   Other Relevant Orders   PCV ECHOCARDIOGRAM COMPLETE   LBBB (left bundle branch block) (Chronic)   Relevant Medications   losartan (COZAAR) 100 MG tablet   Other Relevant Orders   PCV ECHOCARDIOGRAM COMPLETE   TIA (transient ischemic attack)   Relevant Medications   losartan (COZAAR) 100 MG tablet   Other Relevant Orders   PCV ECHOCARDIOGRAM COMPLETE   Acute on chronic diastolic CHF (congestive heart failure), NYHA class 1 (HCC) - Primary   Relevant Medications   losartan (COZAAR) 100 MG tablet   Other Relevant Orders   PCV ECHOCARDIOGRAM COMPLETE       Disposition:   Return in about 1 week (around 10/31/2022) for echo and f/u.    Total time  spent: 30 minutes  Signed,  Adrian Blackwater, MD  10/24/2022 10:21 AM    [facility]

## 2022-10-31 ENCOUNTER — Ambulatory Visit: Payer: PPO

## 2022-10-31 DIAGNOSIS — I361 Nonrheumatic tricuspid (valve) insufficiency: Secondary | ICD-10-CM | POA: Diagnosis not present

## 2022-10-31 DIAGNOSIS — I351 Nonrheumatic aortic (valve) insufficiency: Secondary | ICD-10-CM

## 2022-10-31 DIAGNOSIS — I5033 Acute on chronic diastolic (congestive) heart failure: Secondary | ICD-10-CM

## 2022-10-31 DIAGNOSIS — I422 Other hypertrophic cardiomyopathy: Secondary | ICD-10-CM

## 2022-10-31 DIAGNOSIS — I447 Left bundle-branch block, unspecified: Secondary | ICD-10-CM

## 2022-10-31 DIAGNOSIS — G459 Transient cerebral ischemic attack, unspecified: Secondary | ICD-10-CM

## 2022-10-31 DIAGNOSIS — I10 Essential (primary) hypertension: Secondary | ICD-10-CM

## 2022-11-03 DIAGNOSIS — H40013 Open angle with borderline findings, low risk, bilateral: Secondary | ICD-10-CM | POA: Diagnosis not present

## 2022-11-06 ENCOUNTER — Ambulatory Visit: Payer: PPO | Admitting: Cardiovascular Disease

## 2022-11-06 ENCOUNTER — Encounter: Payer: Self-pay | Admitting: Cardiovascular Disease

## 2022-11-06 VITALS — BP 110/55 | HR 66 | Ht 65.0 in | Wt 156.2 lb

## 2022-11-06 DIAGNOSIS — I5033 Acute on chronic diastolic (congestive) heart failure: Secondary | ICD-10-CM

## 2022-11-06 DIAGNOSIS — I447 Left bundle-branch block, unspecified: Secondary | ICD-10-CM

## 2022-11-06 DIAGNOSIS — I351 Nonrheumatic aortic (valve) insufficiency: Secondary | ICD-10-CM | POA: Diagnosis not present

## 2022-11-06 DIAGNOSIS — G459 Transient cerebral ischemic attack, unspecified: Secondary | ICD-10-CM | POA: Diagnosis not present

## 2022-11-06 DIAGNOSIS — I10 Essential (primary) hypertension: Secondary | ICD-10-CM | POA: Diagnosis not present

## 2022-11-06 NOTE — Progress Notes (Signed)
Cardiology Office Note   Date:  11/06/2022   ID:  [name], [name] Mar 16, 1946, MRN [id number]  PCP:  Benita Stabile, MD  Cardiologist:  Adrian Blackwater, MD      History of Present Illness: MONTANNA MCBAIN is a 77 y.o. female who presents for  Chief Complaint  Patient presents with   Follow-up    ECHO results    Feeling fine      Past Medical History:  Diagnosis Date   Arthritis    Current use of estrogen therapy 03/25/2013   Essential hypertension    GERD (gastroesophageal reflux disease)    H/O cardiac catheterization    2011: normal coronary arteries (false positive stress test).   Hyperlipidemia    Hypothyroidism    LBBB (left bundle branch block)    Nonischemic cardiomyopathy (HCC)    Orthostatic hypotension    associated with syncope   TIA (transient ischemic attack)    On Plavix     Past Surgical History:  Procedure Laterality Date   ABDOMINAL HYSTERECTOMY     COLONOSCOPY  12/25/2010   Procedure: COLONOSCOPY;  Surgeon: Malissa Hippo, MD;  Location: [facility];  Service: Endoscopy;  Laterality: N/A;   NECK SURGERY     Secondary to ruptured disc   PARTIAL HYSTERECTOMY     POSTERIOR CERVICAL FUSION/FORAMINOTOMY  12/18/2011   Procedure: POSTERIOR CERVICAL FUSION/FORAMINOTOMY LEVEL 2;  Surgeon: Hewitt Shorts, MD;  Location: [facility];  Service: Neurosurgery;  Laterality: Bilateral;  Cervical four to six Posterior cervical arthrodesis   WRIST FUSION     Right- torn ligament     Current Outpatient Medications  Medication Sig Dispense Refill   amLODipine (NORVASC) 5 MG tablet Take 1 tablet by mouth daily.     clopidogrel (PLAVIX) 75 MG tablet Take 1 tablet (75 mg total) by mouth daily. 30 tablet 12   Levothyroxine Sodium 88 MCG CAPS Take 75 mcg by mouth daily before breakfast.      losartan (COZAAR) 100 MG tablet Take 1 tablet (100 mg total) by mouth daily. 30 tablet 11   metoprolol tartrate (LOPRESSOR) 25 MG tablet TAKE 1 TABLET BY MOUTH TWICE  A DAY 180 tablet 1   Multiple Vitamins-Minerals (CENTRUM ADULTS PO) Take 1 tablet by mouth daily.     pravastatin (PRAVACHOL) 20 MG tablet Take 1 tablet (20 mg total) by mouth daily. 30 tablet 12   No current facility-administered medications for this visit.    Allergies:   Codeine, Lisinopril, and Other    Social History:   reports that she has never smoked. She has never used smokeless tobacco. She reports that she does not drink alcohol and does not use drugs.   Family History:  family history includes Cancer in her brother; Colon cancer in her mother; Diabetes in her father; Lung cancer in her mother.    ROS:     Review of Systems  Constitutional: Negative.   HENT: Negative.    Eyes: Negative.   Respiratory: Negative.    Gastrointestinal: Negative.   Genitourinary: Negative.   Musculoskeletal: Negative.   Skin: Negative.   Neurological: Negative.   Endo/Heme/Allergies: Negative.   Psychiatric/Behavioral: Negative.    All other systems reviewed and are negative.     All other systems are reviewed and negative.    PHYSICAL EXAM: VS:  BP (!) 110/55   Pulse 66   Ht 5\' 5"  (1.651 m)   Wt 156 lb 3.2 oz (70.9  kg)   SpO2 96%   BMI 25.99 kg/m  , BMI Body mass index is 25.99 kg/m. Last weight:  Wt Readings from Last 3 Encounters:  11/06/22 156 lb 3.2 oz (70.9 kg)  10/24/22 154 lb 9.6 oz (70.1 kg)  09/08/22 153 lb 9.6 oz (69.7 kg)     Physical Exam Constitutional:      Appearance: Normal appearance.  Cardiovascular:     Rate and Rhythm: Normal rate and regular rhythm.     Heart sounds: Normal heart sounds.  Pulmonary:     Effort: Pulmonary effort is normal.     Breath sounds: Normal breath sounds.  Musculoskeletal:     Right lower leg: No edema.     Left lower leg: No edema.  Neurological:     Mental Status: She is alert.       EKG:   Recent Labs: No results found for requested labs within last 365 days.    Lipid Panel    Component Value Date/Time    CHOL 140 02/06/2014 0616   TRIG 100 02/06/2014 0616   HDL 66 02/06/2014 0616   CHOLHDL 2.1 02/06/2014 0616   VLDL 20 02/06/2014 0616   LDLCALC 54 02/06/2014 0616      Other studies Reviewed: Additional studies/ records that were reviewed today include:  Review of the above records demonstrates:       No data to display            ASSESSMENT AND PLAN:    ICD-10-CM   1. Acute on chronic diastolic CHF (congestive heart failure), NYHA class 1 (HCC)  I50.33     2. HYPERTENSION, BENIGN  I10     3. LBBB (left bundle branch block)  I44.7     4. TIA (transient ischemic attack)  G45.9     5. Nonrheumatic aortic valve insufficiency  I35.1    moderate, normal EF 72%, will watch as asymptomatic       Problem List Items Addressed This Visit       Cardiovascular and Mediastinum   HYPERTENSION, BENIGN (Chronic)   LBBB (left bundle branch block) (Chronic)   TIA (transient ischemic attack)   Acute on chronic diastolic CHF (congestive heart failure), NYHA class 1 (HCC) - Primary   Other Visit Diagnoses     Nonrheumatic aortic valve insufficiency       moderate, normal EF 72%, will watch as asymptomatic          Disposition:   Return in about 3 months (around 02/06/2023).    Total time spent: 30 minutes  Signed,  Adrian Blackwater, MD  11/06/2022 9:45 AM    [facility]

## 2022-12-11 ENCOUNTER — Other Ambulatory Visit: Payer: Self-pay | Admitting: Cardiovascular Disease

## 2022-12-11 DIAGNOSIS — R55 Syncope and collapse: Secondary | ICD-10-CM

## 2022-12-17 DIAGNOSIS — B078 Other viral warts: Secondary | ICD-10-CM | POA: Diagnosis not present

## 2022-12-17 DIAGNOSIS — L820 Inflamed seborrheic keratosis: Secondary | ICD-10-CM | POA: Diagnosis not present

## 2022-12-26 DIAGNOSIS — W57XXXA Bitten or stung by nonvenomous insect and other nonvenomous arthropods, initial encounter: Secondary | ICD-10-CM | POA: Diagnosis not present

## 2022-12-26 DIAGNOSIS — L299 Pruritus, unspecified: Secondary | ICD-10-CM | POA: Diagnosis not present

## 2022-12-26 DIAGNOSIS — S30861A Insect bite (nonvenomous) of abdominal wall, initial encounter: Secondary | ICD-10-CM | POA: Diagnosis not present

## 2022-12-26 DIAGNOSIS — L298 Other pruritus: Secondary | ICD-10-CM | POA: Diagnosis not present

## 2022-12-26 DIAGNOSIS — R21 Rash and other nonspecific skin eruption: Secondary | ICD-10-CM | POA: Diagnosis not present

## 2022-12-26 DIAGNOSIS — S80862A Insect bite (nonvenomous), left lower leg, initial encounter: Secondary | ICD-10-CM | POA: Diagnosis not present

## 2022-12-26 DIAGNOSIS — S80861A Insect bite (nonvenomous), right lower leg, initial encounter: Secondary | ICD-10-CM | POA: Diagnosis not present

## 2023-01-06 ENCOUNTER — Ambulatory Visit: Payer: PPO | Admitting: Cardiovascular Disease

## 2023-02-05 ENCOUNTER — Ambulatory Visit: Payer: PPO | Admitting: Cardiovascular Disease

## 2023-02-10 DIAGNOSIS — H43813 Vitreous degeneration, bilateral: Secondary | ICD-10-CM | POA: Diagnosis not present

## 2023-02-10 DIAGNOSIS — H524 Presbyopia: Secondary | ICD-10-CM | POA: Diagnosis not present

## 2023-02-13 ENCOUNTER — Ambulatory Visit: Payer: PPO | Admitting: Cardiovascular Disease

## 2023-02-23 ENCOUNTER — Ambulatory Visit: Payer: PPO | Admitting: Cardiovascular Disease

## 2023-02-23 ENCOUNTER — Encounter: Payer: Self-pay | Admitting: Cardiovascular Disease

## 2023-02-23 VITALS — BP 172/100 | HR 76 | Ht 64.0 in | Wt 158.6 lb

## 2023-02-23 DIAGNOSIS — I5022 Chronic systolic (congestive) heart failure: Secondary | ICD-10-CM | POA: Diagnosis not present

## 2023-02-23 DIAGNOSIS — I5033 Acute on chronic diastolic (congestive) heart failure: Secondary | ICD-10-CM

## 2023-02-23 DIAGNOSIS — I10 Essential (primary) hypertension: Secondary | ICD-10-CM | POA: Diagnosis not present

## 2023-02-23 DIAGNOSIS — G459 Transient cerebral ischemic attack, unspecified: Secondary | ICD-10-CM

## 2023-02-23 DIAGNOSIS — I351 Nonrheumatic aortic (valve) insufficiency: Secondary | ICD-10-CM

## 2023-02-23 DIAGNOSIS — I447 Left bundle-branch block, unspecified: Secondary | ICD-10-CM

## 2023-02-23 MED ORDER — LOSARTAN POTASSIUM-HCTZ 50-12.5 MG PO TABS: 1.0000 | ORAL_TABLET | Freq: Every day | ORAL | 11 refills | Status: DC

## 2023-02-23 NOTE — Progress Notes (Signed)
Cardiology Office Note   Date:  02/23/2023   ID:  [name], [name] 1946/04/19, MRN [id number]  PCP:  Benita Stabile, MD  Cardiologist:  Adrian Blackwater, MD      History of Present Illness: [name] is a 77 y.o. female who presents for No chief complaint on file.   BP elevated      Past Medical History:  Diagnosis Date   Arthritis    Current use of estrogen therapy 03/25/2013   Essential hypertension    GERD (gastroesophageal reflux disease)    H/O cardiac catheterization    2011: normal coronary arteries (false positive stress test).   Hyperlipidemia    Hypothyroidism    LBBB (left bundle branch block)    Nonischemic cardiomyopathy (HCC)    Orthostatic hypotension    associated with syncope   TIA (transient ischemic attack)    On Plavix     Past Surgical History:  Procedure Laterality Date   ABDOMINAL HYSTERECTOMY     COLONOSCOPY  12/25/2010   Procedure: COLONOSCOPY;  Surgeon: Malissa Hippo, MD;  Location: [facility];  Service: Endoscopy;  Laterality: N/A;   NECK SURGERY     Secondary to ruptured disc   PARTIAL HYSTERECTOMY     POSTERIOR CERVICAL FUSION/FORAMINOTOMY  12/18/2011   Procedure: POSTERIOR CERVICAL FUSION/FORAMINOTOMY LEVEL 2;  Surgeon: Hewitt Shorts, MD;  Location: [facility];  Service: Neurosurgery;  Laterality: Bilateral;  Cervical four to six Posterior cervical arthrodesis   WRIST FUSION     Right- torn ligament     Current Outpatient Medications  Medication Sig Dispense Refill   HYDROcodone-acetaminophen (NORCO/VICODIN) 5-325 MG tablet Take 1 tablet by mouth 2 (two) times daily.     losartan-hydrochlorothiazide (HYZAAR) 50-12.5 MG tablet Take 1 tablet by mouth daily. 30 tablet 11   amLODipine (NORVASC) 5 MG tablet Take 1 tablet by mouth daily.     clopidogrel (PLAVIX) 75 MG tablet Take 1 tablet (75 mg total) by mouth daily. 30 tablet 12   Levothyroxine Sodium 88 MCG CAPS Take 75 mcg by mouth daily before breakfast.       metoprolol tartrate (LOPRESSOR) 25 MG tablet TAKE 1 TABLET BY MOUTH TWICE A DAY 180 tablet 1   Multiple Vitamins-Minerals (CENTRUM ADULTS PO) Take 1 tablet by mouth daily.     pravastatin (PRAVACHOL) 20 MG tablet Take 1 tablet (20 mg total) by mouth daily. 30 tablet 12   No current facility-administered medications for this visit.    Allergies:   Codeine, Lisinopril, and Other    Social History:   reports that she has never smoked. She has never used smokeless tobacco. She reports that she does not drink alcohol and does not use drugs.   Family History:  family history includes Cancer in her brother; Colon cancer in her mother; Diabetes in her father; Lung cancer in her mother.    ROS:     Review of Systems  Constitutional: Negative.   HENT: Negative.    Eyes: Negative.   Respiratory: Negative.    Gastrointestinal: Negative.   Genitourinary: Negative.   Musculoskeletal: Negative.   Skin: Negative.   Neurological: Negative.   Endo/Heme/Allergies: Negative.   Psychiatric/Behavioral: Negative.    All other systems reviewed and are negative.     All other systems are reviewed and negative.    PHYSICAL EXAM: VS:  BP (!) 172/100   Pulse 76   Ht 5\' 4"  (1.626 m)   Wt 158  lb 9.6 oz (71.9 kg)   SpO2 99%   BMI 27.22 kg/m  , BMI Body mass index is 27.22 kg/m. Last weight:  Wt Readings from Last 3 Encounters:  02/23/23 158 lb 9.6 oz (71.9 kg)  11/06/22 156 lb 3.2 oz (70.9 kg)  10/24/22 154 lb 9.6 oz (70.1 kg)     Physical Exam Constitutional:      Appearance: Normal appearance.  Cardiovascular:     Rate and Rhythm: Normal rate and regular rhythm.     Heart sounds: Normal heart sounds.  Pulmonary:     Effort: Pulmonary effort is normal.     Breath sounds: Normal breath sounds.  Musculoskeletal:     Right lower leg: No edema.     Left lower leg: No edema.  Neurological:     Mental Status: She is alert.       EKG:   Recent Labs: No results found for requested  labs within last 365 days.    Lipid Panel    Component Value Date/Time   CHOL 140 02/06/2014 0616   TRIG 100 02/06/2014 0616   HDL 66 02/06/2014 0616   CHOLHDL 2.1 02/06/2014 0616   VLDL 20 02/06/2014 0616   LDLCALC 54 02/06/2014 0616      Other studies Reviewed: Additional studies/ records that were reviewed today include:  Review of the above records demonstrates:       No data to display            ASSESSMENT AND PLAN:    ICD-10-CM   1. HYPERTENSION, BENIGN  I10 losartan-hydrochlorothiazide (HYZAAR) 50-12.5 MG tablet   BP elvated, start hyzaar 50    2. Acute on chronic diastolic CHF (congestive heart failure), NYHA class 1 (HCC)  I50.33 losartan-hydrochlorothiazide (HYZAAR) 50-12.5 MG tablet    3. TIA (transient ischemic attack)  G45.9 losartan-hydrochlorothiazide (HYZAAR) 50-12.5 MG tablet    4. LBBB (left bundle branch block)  I44.7 losartan-hydrochlorothiazide (HYZAAR) 50-12.5 MG tablet    5. Nonrheumatic aortic valve insufficiency  I35.1 losartan-hydrochlorothiazide (HYZAAR) 50-12.5 MG tablet    6. Chronic systolic heart failure (HCC)  Z61.09 losartan-hydrochlorothiazide (HYZAAR) 50-12.5 MG tablet    7. Primary hypertension  I10 losartan-hydrochlorothiazide (HYZAAR) 50-12.5 MG tablet       Problem List Items Addressed This Visit       Cardiovascular and Mediastinum   HYPERTENSION, BENIGN - Primary (Chronic)   Relevant Medications   losartan-hydrochlorothiazide (HYZAAR) 50-12.5 MG tablet   LBBB (left bundle branch block) (Chronic)   Relevant Medications   losartan-hydrochlorothiazide (HYZAAR) 50-12.5 MG tablet   TIA (transient ischemic attack)   Relevant Medications   losartan-hydrochlorothiazide (HYZAAR) 50-12.5 MG tablet   Acute on chronic diastolic CHF (congestive heart failure), NYHA class 1 (HCC)   Relevant Medications   losartan-hydrochlorothiazide (HYZAAR) 50-12.5 MG tablet   Chronic systolic heart failure (HCC)   Relevant Medications    losartan-hydrochlorothiazide (HYZAAR) 50-12.5 MG tablet   Other Visit Diagnoses     Nonrheumatic aortic valve insufficiency       Relevant Medications   losartan-hydrochlorothiazide (HYZAAR) 50-12.5 MG tablet   Primary hypertension       Relevant Medications   losartan-hydrochlorothiazide (HYZAAR) 50-12.5 MG tablet          Disposition:   Return in about 1 week (around 03/02/2023).    Total time spent: 30 minutes  Signed,  Adrian Blackwater, MD  02/23/2023 10:19 AM    [facility]

## 2023-03-03 ENCOUNTER — Encounter: Payer: Self-pay | Admitting: Cardiovascular Disease

## 2023-03-03 ENCOUNTER — Ambulatory Visit: Payer: PPO | Admitting: Cardiovascular Disease

## 2023-03-03 VITALS — BP 130/70 | HR 65 | Ht 64.0 in | Wt 158.4 lb

## 2023-03-03 DIAGNOSIS — I351 Nonrheumatic aortic (valve) insufficiency: Secondary | ICD-10-CM | POA: Diagnosis not present

## 2023-03-03 DIAGNOSIS — G459 Transient cerebral ischemic attack, unspecified: Secondary | ICD-10-CM

## 2023-03-03 DIAGNOSIS — I447 Left bundle-branch block, unspecified: Secondary | ICD-10-CM

## 2023-03-03 DIAGNOSIS — I5022 Chronic systolic (congestive) heart failure: Secondary | ICD-10-CM

## 2023-03-03 DIAGNOSIS — I10 Essential (primary) hypertension: Secondary | ICD-10-CM | POA: Diagnosis not present

## 2023-03-03 NOTE — Progress Notes (Signed)
Cardiology Office Note   Date:  03/03/2023   ID:  Adeleine, Gerads Dec 29, 1945, MRN [id number]  PCP:  Benita Stabile, MD  Cardiologist:  Adrian Blackwater, MD      History of Present Illness: [name] is a 77 y.o. female who presents for  Chief Complaint  Patient presents with   Follow-up    Feeling better, but has at night indigestion      Past Medical History:  Diagnosis Date   Arthritis    Current use of estrogen therapy 03/25/2013   Essential hypertension    GERD (gastroesophageal reflux disease)    H/O cardiac catheterization    2011: normal coronary arteries (false positive stress test).   Hyperlipidemia    Hypothyroidism    LBBB (left bundle branch block)    Nonischemic cardiomyopathy (HCC)    Orthostatic hypotension    associated with syncope   TIA (transient ischemic attack)    On Plavix     Past Surgical History:  Procedure Laterality Date   ABDOMINAL HYSTERECTOMY     COLONOSCOPY  12/25/2010   Procedure: COLONOSCOPY;  Surgeon: Malissa Hippo, MD;  Location: [facility];  Service: Endoscopy;  Laterality: N/A;   NECK SURGERY     Secondary to ruptured disc   PARTIAL HYSTERECTOMY     POSTERIOR CERVICAL FUSION/FORAMINOTOMY  12/18/2011   Procedure: POSTERIOR CERVICAL FUSION/FORAMINOTOMY LEVEL 2;  Surgeon: Hewitt Shorts, MD;  Location: [facility];  Service: Neurosurgery;  Laterality: Bilateral;  Cervical four to six Posterior cervical arthrodesis   WRIST FUSION     Right- torn ligament     Current Outpatient Medications  Medication Sig Dispense Refill   amLODipine (NORVASC) 5 MG tablet Take 1 tablet by mouth daily.     clopidogrel (PLAVIX) 75 MG tablet Take 1 tablet (75 mg total) by mouth daily. 30 tablet 12   HYDROcodone-acetaminophen (NORCO/VICODIN) 5-325 MG tablet Take 1 tablet by mouth 2 (two) times daily.     Levothyroxine Sodium 88 MCG CAPS Take 75 mcg by mouth daily before breakfast.      metoprolol tartrate (LOPRESSOR) 25 MG  tablet TAKE 1 TABLET BY MOUTH TWICE A DAY 180 tablet 1   Multiple Vitamins-Minerals (CENTRUM ADULTS PO) Take 1 tablet by mouth daily.     pravastatin (PRAVACHOL) 20 MG tablet Take 1 tablet (20 mg total) by mouth daily. 30 tablet 12   No current facility-administered medications for this visit.    Allergies:   Codeine, Lisinopril, and Other    Social History:   reports that she has never smoked. She has never used smokeless tobacco. She reports that she does not drink alcohol and does not use drugs.   Family History:  family history includes Cancer in her brother; Colon cancer in her mother; Diabetes in her father; Lung cancer in her mother.    ROS:     Review of Systems  Constitutional: Negative.   HENT: Negative.    Eyes: Negative.   Respiratory: Negative.    Gastrointestinal: Negative.   Genitourinary: Negative.   Musculoskeletal: Negative.   Skin: Negative.   Neurological: Negative.   Endo/Heme/Allergies: Negative.   Psychiatric/Behavioral: Negative.    All other systems reviewed and are negative.     All other systems are reviewed and negative.    PHYSICAL EXAM: VS:  BP 130/70   Pulse 65   Ht 5\' 4"  (1.626 m)   Wt 158 lb 6.4 oz (71.8 kg)  SpO2 97%   BMI 27.19 kg/m  , BMI Body mass index is 27.19 kg/m. Last weight:  Wt Readings from Last 3 Encounters:  03/03/23 158 lb 6.4 oz (71.8 kg)  02/23/23 158 lb 9.6 oz (71.9 kg)  11/06/22 156 lb 3.2 oz (70.9 kg)     Physical Exam Constitutional:      Appearance: Normal appearance.  Cardiovascular:     Rate and Rhythm: Normal rate and regular rhythm.     Heart sounds: Normal heart sounds.  Pulmonary:     Effort: Pulmonary effort is normal.     Breath sounds: Normal breath sounds.  Musculoskeletal:     Right lower leg: No edema.     Left lower leg: No edema.  Neurological:     Mental Status: She is alert.       EKG:   Recent Labs: No results found for requested labs within last 365 days.    Lipid  Panel    Component Value Date/Time   CHOL 140 02/06/2014 0616   TRIG 100 02/06/2014 0616   HDL 66 02/06/2014 0616   CHOLHDL 2.1 02/06/2014 0616   VLDL 20 02/06/2014 0616   LDLCALC 54 02/06/2014 0616      Other studies Reviewed: Additional studies/ records that were reviewed today include:  Review of the above records demonstrates:       No data to display            ASSESSMENT AND PLAN:    ICD-10-CM   1. HYPERTENSION, BENIGN  I10    Was taking both Losartan 50 and hyzaaar 50/12.5, so since bp normal, advise changing to hyzaar100/12.5    2. TIA (transient ischemic attack)  G45.9     3. LBBB (left bundle branch block)  I44.7     4. Nonrheumatic aortic valve insufficiency  I35.1     5. Chronic systolic heart failure (HCC)  Z61.09        Problem List Items Addressed This Visit       Cardiovascular and Mediastinum   HYPERTENSION, BENIGN - Primary (Chronic)   LBBB (left bundle branch block) (Chronic)   TIA (transient ischemic attack)   Chronic systolic heart failure (HCC)   Other Visit Diagnoses     Nonrheumatic aortic valve insufficiency              Disposition:   Return in about 2 months (around 05/03/2023).    Total time spent: 30 minutes  Signed,  Adrian Blackwater, MD  03/03/2023 10:53 AM    [facility]

## 2023-03-25 DIAGNOSIS — E782 Mixed hyperlipidemia: Secondary | ICD-10-CM | POA: Diagnosis not present

## 2023-03-25 DIAGNOSIS — E039 Hypothyroidism, unspecified: Secondary | ICD-10-CM | POA: Diagnosis not present

## 2023-03-25 DIAGNOSIS — R7301 Impaired fasting glucose: Secondary | ICD-10-CM | POA: Diagnosis not present

## 2023-04-02 DIAGNOSIS — L851 Acquired keratosis [keratoderma] palmaris et plantaris: Secondary | ICD-10-CM | POA: Diagnosis not present

## 2023-04-02 DIAGNOSIS — M2012 Hallux valgus (acquired), left foot: Secondary | ICD-10-CM | POA: Diagnosis not present

## 2023-04-02 DIAGNOSIS — M79672 Pain in left foot: Secondary | ICD-10-CM | POA: Diagnosis not present

## 2023-04-16 ENCOUNTER — Encounter: Payer: Self-pay | Admitting: Internal Medicine

## 2023-04-16 DIAGNOSIS — M810 Age-related osteoporosis without current pathological fracture: Secondary | ICD-10-CM | POA: Diagnosis not present

## 2023-04-16 DIAGNOSIS — R55 Syncope and collapse: Secondary | ICD-10-CM | POA: Diagnosis not present

## 2023-04-16 DIAGNOSIS — I5022 Chronic systolic (congestive) heart failure: Secondary | ICD-10-CM | POA: Diagnosis not present

## 2023-04-16 DIAGNOSIS — R7301 Impaired fasting glucose: Secondary | ICD-10-CM | POA: Diagnosis not present

## 2023-04-16 DIAGNOSIS — M542 Cervicalgia: Secondary | ICD-10-CM | POA: Diagnosis not present

## 2023-04-16 DIAGNOSIS — E039 Hypothyroidism, unspecified: Secondary | ICD-10-CM | POA: Diagnosis not present

## 2023-04-16 DIAGNOSIS — G4700 Insomnia, unspecified: Secondary | ICD-10-CM | POA: Diagnosis not present

## 2023-04-16 DIAGNOSIS — N1831 Chronic kidney disease, stage 3a: Secondary | ICD-10-CM | POA: Diagnosis not present

## 2023-04-16 DIAGNOSIS — I129 Hypertensive chronic kidney disease with stage 1 through stage 4 chronic kidney disease, or unspecified chronic kidney disease: Secondary | ICD-10-CM | POA: Diagnosis not present

## 2023-04-16 DIAGNOSIS — I447 Left bundle-branch block, unspecified: Secondary | ICD-10-CM | POA: Diagnosis not present

## 2023-04-16 DIAGNOSIS — E782 Mixed hyperlipidemia: Secondary | ICD-10-CM | POA: Diagnosis not present

## 2023-04-16 DIAGNOSIS — Z0001 Encounter for general adult medical examination with abnormal findings: Secondary | ICD-10-CM | POA: Diagnosis not present

## 2023-04-28 DIAGNOSIS — Z1212 Encounter for screening for malignant neoplasm of rectum: Secondary | ICD-10-CM | POA: Diagnosis not present

## 2023-06-04 ENCOUNTER — Encounter: Payer: Self-pay | Admitting: Cardiovascular Disease

## 2023-06-04 ENCOUNTER — Ambulatory Visit: Payer: PPO | Admitting: Cardiovascular Disease

## 2023-06-04 VITALS — BP 148/87 | HR 73 | Ht 64.0 in | Wt 156.0 lb

## 2023-06-04 DIAGNOSIS — R9431 Abnormal electrocardiogram [ECG] [EKG]: Secondary | ICD-10-CM

## 2023-06-04 DIAGNOSIS — G459 Transient cerebral ischemic attack, unspecified: Secondary | ICD-10-CM | POA: Diagnosis not present

## 2023-06-04 DIAGNOSIS — I10 Essential (primary) hypertension: Secondary | ICD-10-CM

## 2023-06-04 DIAGNOSIS — I5022 Chronic systolic (congestive) heart failure: Secondary | ICD-10-CM | POA: Diagnosis not present

## 2023-06-04 DIAGNOSIS — I447 Left bundle-branch block, unspecified: Secondary | ICD-10-CM

## 2023-06-04 MED ORDER — LOSARTAN POTASSIUM 100 MG PO TABS: 100.0000 mg | ORAL_TABLET | Freq: Every day | ORAL | 11 refills | Status: DC

## 2023-06-04 MED ORDER — AMLODIPINE BESYLATE 10 MG PO TABS: 10.0000 mg | ORAL_TABLET | Freq: Every day | ORAL | 11 refills | Status: DC

## 2023-06-04 NOTE — Progress Notes (Signed)
Cardiology Office Note   Date:  06/04/2023   ID:  Mariangel, Ringley [birth year], MRN [id number]  PCP:  Benita Stabile, MD  Cardiologist:  Adrian Blackwater, MD      History of Present Illness: [name] is a 78 y.o. female who presents for  Chief Complaint  Patient presents with   Follow-up    3 month follow up    Occasional dizziness  Dizziness The problem occurs rarely.      Past Medical History:  Diagnosis Date   Arthritis    Current use of estrogen therapy 03/25/2013   Essential hypertension    GERD (gastroesophageal reflux disease)    H/O cardiac catheterization    2011: normal coronary arteries (false positive stress test).   Hyperlipidemia    Hypothyroidism    LBBB (left bundle branch block)    Nonischemic cardiomyopathy (HCC)    Orthostatic hypotension    associated with syncope   TIA (transient ischemic attack)    On Plavix     Past Surgical History:  Procedure Laterality Date   ABDOMINAL HYSTERECTOMY     COLONOSCOPY  12/25/2010   Procedure: COLONOSCOPY;  Surgeon: Malissa Hippo, MD;  Location: [facility];  Service: Endoscopy;  Laterality: N/A;   NECK SURGERY     Secondary to ruptured disc   PARTIAL HYSTERECTOMY     POSTERIOR CERVICAL FUSION/FORAMINOTOMY  12/18/2011   Procedure: POSTERIOR CERVICAL FUSION/FORAMINOTOMY LEVEL 2;  Surgeon: Hewitt Shorts, MD;  Location: [facility];  Service: Neurosurgery;  Laterality: Bilateral;  Cervical four to six Posterior cervical arthrodesis   WRIST FUSION     Right- torn ligament     Current Outpatient Medications  Medication Sig Dispense Refill   amLODipine (NORVASC) 10 MG tablet Take 1 tablet (10 mg total) by mouth daily. 30 tablet 11   losartan (COZAAR) 100 MG tablet Take 1 tablet (100 mg total) by mouth daily. 30 tablet 11   clopidogrel (PLAVIX) 75 MG tablet Take 1 tablet (75 mg total) by mouth daily. 30 tablet 12   HYDROcodone-acetaminophen (NORCO/VICODIN) 5-325 MG tablet Take 1 tablet by  mouth 2 (two) times daily.     Levothyroxine Sodium 88 MCG CAPS Take 75 mcg by mouth daily before breakfast.      metoprolol tartrate (LOPRESSOR) 25 MG tablet TAKE 1 TABLET BY MOUTH TWICE A DAY 180 tablet 1   Multiple Vitamins-Minerals (CENTRUM ADULTS PO) Take 1 tablet by mouth daily.     pravastatin (PRAVACHOL) 20 MG tablet Take 1 tablet (20 mg total) by mouth daily. 30 tablet 12   No current facility-administered medications for this visit.    Allergies:   Codeine, Lisinopril, and Other    Social History:   reports that she has never smoked. She has never used smokeless tobacco. She reports that she does not drink alcohol and does not use drugs.   Family History:  family history includes Cancer in her brother; Colon cancer in her mother; Diabetes in her father; Lung cancer in her mother.    ROS:     Review of Systems  Constitutional: Negative.   HENT: Negative.    Eyes: Negative.   Respiratory: Negative.    Gastrointestinal: Negative.   Genitourinary: Negative.   Musculoskeletal: Negative.   Skin: Negative.   Neurological:  Positive for dizziness.  Endo/Heme/Allergies: Negative.   Psychiatric/Behavioral: Negative.    All other systems reviewed and are negative.     All other systems are  reviewed and negative.    PHYSICAL EXAM: VS:  BP (!) 148/87   Pulse 73   Ht 5\' 4"  (1.626 m)   Wt 156 lb (70.8 kg)   SpO2 98%   BMI 26.78 kg/m  , BMI Body mass index is 26.78 kg/m. Last weight:  Wt Readings from Last 3 Encounters:  06/04/23 156 lb (70.8 kg)  03/03/23 158 lb 6.4 oz (71.8 kg)  02/23/23 158 lb 9.6 oz (71.9 kg)     Physical Exam Constitutional:      Appearance: Normal appearance.  Cardiovascular:     Rate and Rhythm: Normal rate and regular rhythm.     Heart sounds: Normal heart sounds.  Pulmonary:     Effort: Pulmonary effort is normal.     Breath sounds: Normal breath sounds.  Musculoskeletal:     Right lower leg: No edema.     Left lower leg: No edema.   Neurological:     Mental Status: She is alert.       EKG:   Recent Labs: No results found for requested labs within last 365 days.    Lipid Panel    Component Value Date/Time   CHOL 140 02/06/2014 0616   TRIG 100 02/06/2014 0616   HDL 66 02/06/2014 0616   CHOLHDL 2.1 02/06/2014 0616   VLDL 20 02/06/2014 0616   LDLCALC 54 02/06/2014 0616      Other studies Reviewed: Additional studies/ records that were reviewed today include:  Review of the above records demonstrates:       No data to display            ASSESSMENT AND PLAN:    ICD-10-CM   1. HYPERTENSION, BENIGN  I10 amLODipine (NORVASC) 10 MG tablet    losartan (COZAAR) 100 MG tablet   Repeat BP 145/85, change amlodapine from 5 to 10 mg daily. continue losartan 100 mg daily    2. TIA (transient ischemic attack)  G45.9 amLODipine (NORVASC) 10 MG tablet    losartan (COZAAR) 100 MG tablet    3. LBBB (left bundle branch block)  I44.7 amLODipine (NORVASC) 10 MG tablet    losartan (COZAAR) 100 MG tablet    4. Chronic systolic heart failure (HCC)  Z61.09 amLODipine (NORVASC) 10 MG tablet    losartan (COZAAR) 100 MG tablet    5. Nonspecific abnormal electrocardiogram (ECG) (EKG)  R94.31 amLODipine (NORVASC) 10 MG tablet    losartan (COZAAR) 100 MG tablet       Problem List Items Addressed This Visit       Cardiovascular and Mediastinum   HYPERTENSION, BENIGN - Primary (Chronic)   Relevant Medications   amLODipine (NORVASC) 10 MG tablet   losartan (COZAAR) 100 MG tablet   LBBB (left bundle branch block) (Chronic)   Relevant Medications   amLODipine (NORVASC) 10 MG tablet   losartan (COZAAR) 100 MG tablet   TIA (transient ischemic attack)   Relevant Medications   amLODipine (NORVASC) 10 MG tablet   losartan (COZAAR) 100 MG tablet   Chronic systolic heart failure (HCC)   Relevant Medications   amLODipine (NORVASC) 10 MG tablet   losartan (COZAAR) 100 MG tablet     Other   Nonspecific abnormal  electrocardiogram (ECG) (EKG)   Relevant Medications   amLODipine (NORVASC) 10 MG tablet   losartan (COZAAR) 100 MG tablet       Disposition:   Return in about 2 months (around 08/02/2023).    Total time spent: 30 minutes  Signed,  Adrian Blackwater, MD  06/04/2023 9:45 AM    [facility]

## 2023-06-05 ENCOUNTER — Other Ambulatory Visit: Payer: Self-pay | Admitting: Cardiovascular Disease

## 2023-06-05 DIAGNOSIS — R55 Syncope and collapse: Secondary | ICD-10-CM

## 2023-06-16 DIAGNOSIS — L570 Actinic keratosis: Secondary | ICD-10-CM | POA: Diagnosis not present

## 2023-06-16 DIAGNOSIS — L820 Inflamed seborrheic keratosis: Secondary | ICD-10-CM | POA: Diagnosis not present

## 2023-06-16 DIAGNOSIS — X32XXXD Exposure to sunlight, subsequent encounter: Secondary | ICD-10-CM | POA: Diagnosis not present

## 2023-08-04 ENCOUNTER — Encounter: Payer: Self-pay | Admitting: Cardiovascular Disease

## 2023-08-04 ENCOUNTER — Ambulatory Visit: Payer: PPO | Admitting: Cardiovascular Disease

## 2023-08-04 VITALS — BP 111/72 | HR 67 | Ht 65.0 in | Wt 163.0 lb

## 2023-08-04 DIAGNOSIS — I10 Essential (primary) hypertension: Secondary | ICD-10-CM | POA: Diagnosis not present

## 2023-08-04 DIAGNOSIS — R0789 Other chest pain: Secondary | ICD-10-CM | POA: Diagnosis not present

## 2023-08-04 DIAGNOSIS — G459 Transient cerebral ischemic attack, unspecified: Secondary | ICD-10-CM | POA: Diagnosis not present

## 2023-08-04 DIAGNOSIS — I351 Nonrheumatic aortic (valve) insufficiency: Secondary | ICD-10-CM | POA: Diagnosis not present

## 2023-08-04 DIAGNOSIS — R55 Syncope and collapse: Secondary | ICD-10-CM | POA: Diagnosis not present

## 2023-08-04 DIAGNOSIS — I5033 Acute on chronic diastolic (congestive) heart failure: Secondary | ICD-10-CM | POA: Diagnosis not present

## 2023-08-04 DIAGNOSIS — R42 Dizziness and giddiness: Secondary | ICD-10-CM

## 2023-08-04 MED ORDER — LOSARTAN POTASSIUM 50 MG PO TABS: 50.0000 mg | ORAL_TABLET | Freq: Two times a day (BID) | ORAL | 11 refills | Status: DC

## 2023-08-04 NOTE — Addendum Note (Signed)
 Addended by: Debborah Fairly A on: 08/04/2023 11:38 AM   Modules accepted: Orders

## 2023-08-04 NOTE — Progress Notes (Signed)
 Cardiology Office Note   Date:  08/04/2023   ID:  Markell, Schrier May 08, 1945, MRN [id number]  PCP:  Benita Stabile, MD  Cardiologist:  Adrian Blackwater, MD      History of Present Illness: [name] is a 78 y.o. female who presents for  Chief Complaint  Patient presents with   Follow-up    2 month follow up  Lower BP, pt passed out 80/40**    Gets SOB when goes uphill, and has stop and sit down. Also passed out as BP was 80/40. Has swelling of legs.  Loss of Consciousness This is a new problem. The current episode started 1 to 4 weeks ago.  Shortness of Breath This is a new problem. The current episode started 1 to 4 weeks ago. The problem has been gradually worsening. Associated symptoms include leg swelling and syncope.      Past Medical History:  Diagnosis Date   Arthritis    Current use of estrogen therapy 03/25/2013   Essential hypertension    GERD (gastroesophageal reflux disease)    H/O cardiac catheterization    2011: normal coronary arteries (false positive stress test).   Hyperlipidemia    Hypothyroidism    LBBB (left bundle branch block)    Nonischemic cardiomyopathy (HCC)    Orthostatic hypotension    associated with syncope   TIA (transient ischemic attack)    On Plavix     Past Surgical History:  Procedure Laterality Date   ABDOMINAL HYSTERECTOMY     COLONOSCOPY  12/25/2010   Procedure: COLONOSCOPY;  Surgeon: Malissa Hippo, MD;  Location: [facility];  Service: Endoscopy;  Laterality: N/A;   NECK SURGERY     Secondary to ruptured disc   PARTIAL HYSTERECTOMY     POSTERIOR CERVICAL FUSION/FORAMINOTOMY  12/18/2011   Procedure: POSTERIOR CERVICAL FUSION/FORAMINOTOMY LEVEL 2;  Surgeon: Hewitt Shorts, MD;  Location: [facility];  Service: Neurosurgery;  Laterality: Bilateral;  Cervical four to six Posterior cervical arthrodesis   WRIST FUSION     Right- torn ligament     Current Outpatient Medications  Medication Sig Dispense  Refill   carvedilol (COREG) 12.5 MG tablet Take 12.5 mg by mouth 2 (two) times daily with a meal.     losartan (COZAAR) 50 MG tablet Take 1 tablet (50 mg total) by mouth 2 (two) times daily. 60 tablet 11   amLODipine (NORVASC) 10 MG tablet Take 1 tablet (10 mg total) by mouth daily. 30 tablet 11   clopidogrel (PLAVIX) 75 MG tablet Take 1 tablet (75 mg total) by mouth daily. 30 tablet 12   HYDROcodone-acetaminophen (NORCO/VICODIN) 5-325 MG tablet Take 1 tablet by mouth 2 (two) times daily.     Levothyroxine Sodium 88 MCG CAPS Take 75 mcg by mouth daily before breakfast.      Multiple Vitamins-Minerals (CENTRUM ADULTS PO) Take 1 tablet by mouth daily.     pravastatin (PRAVACHOL) 20 MG tablet Take 1 tablet (20 mg total) by mouth daily. 30 tablet 12   No current facility-administered medications for this visit.    Allergies:   Codeine, Lisinopril, and Other    Social History:   reports that she has never smoked. She has never used smokeless tobacco. She reports that she does not drink alcohol and does not use drugs.   Family History:  family history includes Cancer in her brother; Colon cancer in her mother; Diabetes in her father; Lung cancer in her mother.  ROS:     Review of Systems  Constitutional: Negative.   HENT: Negative.    Eyes: Negative.   Respiratory:  Positive for shortness of breath.   Cardiovascular:  Positive for leg swelling and syncope.  Gastrointestinal: Negative.   Genitourinary: Negative.   Musculoskeletal: Negative.   Skin: Negative.   Endo/Heme/Allergies: Negative.   Psychiatric/Behavioral: Negative.    All other systems reviewed and are negative.     All other systems are reviewed and negative.    PHYSICAL EXAM: VS:  BP 111/72   Pulse 67   Ht 5\' 5"  (1.651 m)   Wt 163 lb (73.9 kg)   SpO2 96%   BMI 27.12 kg/m  , BMI Body mass index is 27.12 kg/m. Last weight:  Wt Readings from Last 3 Encounters:  08/04/23 163 lb (73.9 kg)  06/04/23 156 lb (70.8  kg)  03/03/23 158 lb 6.4 oz (71.8 kg)     Physical Exam Constitutional:      Appearance: Normal appearance.  Cardiovascular:     Rate and Rhythm: Normal rate and regular rhythm.     Heart sounds: Normal heart sounds.  Pulmonary:     Effort: Pulmonary effort is normal.     Breath sounds: Normal breath sounds.  Musculoskeletal:     Right lower leg: No edema.     Left lower leg: No edema.  Neurological:     Mental Status: She is alert.       EKG:   Recent Labs: No results found for requested labs within last 365 days.    Lipid Panel    Component Value Date/Time   CHOL 140 02/06/2014 0616   TRIG 100 02/06/2014 0616   HDL 66 02/06/2014 0616   CHOLHDL 2.1 02/06/2014 0616   VLDL 20 02/06/2014 0616   LDLCALC 54 02/06/2014 0616      Other studies Reviewed: Additional studies/ records that were reviewed today include:  Review of the above records demonstrates:       No data to display            ASSESSMENT AND PLAN:    ICD-10-CM   1. Other chest pain  R07.89 MYOCARDIAL PERFUSION IMAGING    PCV ECHOCARDIOGRAM COMPLETE    US ARTERIAL ABI (SCREENING LOWER EXTREMITY)    losartan (COZAAR) 50 MG tablet   has tightness and SOB on minimal esertion, advise stress test    2. HYPERTENSION, BENIGN  I10 MYOCARDIAL PERFUSION IMAGING    PCV ECHOCARDIOGRAM COMPLETE    US ARTERIAL ABI (SCREENING LOWER EXTREMITY)    losartan (COZAAR) 50 MG tablet   bp stop metoprolol, and decrease losartan 50 from 100 mg    3. TIA (transient ischemic attack)  G45.9 MYOCARDIAL PERFUSION IMAGING    PCV ECHOCARDIOGRAM COMPLETE    US ARTERIAL ABI (SCREENING LOWER EXTREMITY)    losartan (COZAAR) 50 MG tablet    4. Nonrheumatic aortic valve insufficiency  I35.1 MYOCARDIAL PERFUSION IMAGING    PCV ECHOCARDIOGRAM COMPLETE    US ARTERIAL ABI (SCREENING LOWER EXTREMITY)    losartan (COZAAR) 50 MG tablet    5. Acute on chronic diastolic CHF (congestive heart failure), NYHA class 1 (HCC)   I50.33 MYOCARDIAL PERFUSION IMAGING    PCV ECHOCARDIOGRAM COMPLETE    US ARTERIAL ABI (SCREENING LOWER EXTREMITY)    losartan (COZAAR) 50 MG tablet    6. Dizziness  R42 MYOCARDIAL PERFUSION IMAGING    PCV ECHOCARDIOGRAM COMPLETE    US ARTERIAL ABI (SCREENING LOWER EXTREMITY)  losartan (COZAAR) 50 MG tablet    7. Syncope, unspecified syncope type  R55 MYOCARDIAL PERFUSION IMAGING    PCV ECHOCARDIOGRAM COMPLETE    US  ARTERIAL ABI (SCREENING LOWER EXTREMITY)    losartan (COZAAR) 50 MG tablet   Do caroted dopplers       Problem List Items Addressed This Visit       Cardiovascular and Mediastinum   HYPERTENSION, BENIGN (Chronic)   Relevant Medications   carvedilol (COREG) 12.5 MG tablet   losartan (COZAAR) 50 MG tablet   Other Relevant Orders   MYOCARDIAL PERFUSION IMAGING   PCV ECHOCARDIOGRAM COMPLETE   US  ARTERIAL ABI (SCREENING LOWER EXTREMITY)   TIA (transient ischemic attack)   Relevant Medications   carvedilol (COREG) 12.5 MG tablet   losartan (COZAAR) 50 MG tablet   Other Relevant Orders   MYOCARDIAL PERFUSION IMAGING   PCV ECHOCARDIOGRAM COMPLETE   US  ARTERIAL ABI (SCREENING LOWER EXTREMITY)   Acute on chronic diastolic CHF (congestive heart failure), NYHA class 1 (HCC)   Relevant Medications   carvedilol (COREG) 12.5 MG tablet   losartan (COZAAR) 50 MG tablet   Other Relevant Orders   MYOCARDIAL PERFUSION IMAGING   PCV ECHOCARDIOGRAM COMPLETE   US  ARTERIAL ABI (SCREENING LOWER EXTREMITY)     Other   Syncope   Relevant Medications   losartan (COZAAR) 50 MG tablet   Other Relevant Orders   MYOCARDIAL PERFUSION IMAGING   PCV ECHOCARDIOGRAM COMPLETE   US  ARTERIAL ABI (SCREENING LOWER EXTREMITY)   Other Visit Diagnoses       Other chest pain    -  Primary   has tightness and SOB on minimal esertion, advise stress test   Relevant Medications   losartan (COZAAR) 50 MG tablet   Other Relevant Orders   MYOCARDIAL PERFUSION IMAGING   PCV ECHOCARDIOGRAM  COMPLETE   US  ARTERIAL ABI (SCREENING LOWER EXTREMITY)     Nonrheumatic aortic valve insufficiency       Relevant Medications   carvedilol (COREG) 12.5 MG tablet   losartan (COZAAR) 50 MG tablet   Other Relevant Orders   MYOCARDIAL PERFUSION IMAGING   PCV ECHOCARDIOGRAM COMPLETE   US  ARTERIAL ABI (SCREENING LOWER EXTREMITY)     Dizziness       Relevant Medications   losartan (COZAAR) 50 MG tablet   Other Relevant Orders   MYOCARDIAL PERFUSION IMAGING   PCV ECHOCARDIOGRAM COMPLETE   US  ARTERIAL ABI (SCREENING LOWER EXTREMITY)          Disposition:   Return in about 3 weeks (around 08/25/2023) for stress test, echo, caroted doppler.    Total time spent: 40 minutes  Signed,  Debborah Fairly, MD  08/04/2023 9:46 AM    [facility]

## 2023-08-12 ENCOUNTER — Other Ambulatory Visit

## 2023-08-19 ENCOUNTER — Ambulatory Visit (INDEPENDENT_AMBULATORY_CARE_PROVIDER_SITE_OTHER)

## 2023-08-19 DIAGNOSIS — R55 Syncope and collapse: Secondary | ICD-10-CM | POA: Diagnosis not present

## 2023-08-19 DIAGNOSIS — G459 Transient cerebral ischemic attack, unspecified: Secondary | ICD-10-CM | POA: Diagnosis not present

## 2023-08-19 DIAGNOSIS — R0789 Other chest pain: Secondary | ICD-10-CM | POA: Diagnosis not present

## 2023-08-19 DIAGNOSIS — I5033 Acute on chronic diastolic (congestive) heart failure: Secondary | ICD-10-CM

## 2023-08-19 DIAGNOSIS — I351 Nonrheumatic aortic (valve) insufficiency: Secondary | ICD-10-CM

## 2023-08-19 DIAGNOSIS — I10 Essential (primary) hypertension: Secondary | ICD-10-CM

## 2023-08-19 DIAGNOSIS — R42 Dizziness and giddiness: Secondary | ICD-10-CM

## 2023-08-20 ENCOUNTER — Ambulatory Visit (INDEPENDENT_AMBULATORY_CARE_PROVIDER_SITE_OTHER)

## 2023-08-20 DIAGNOSIS — R42 Dizziness and giddiness: Secondary | ICD-10-CM

## 2023-08-20 DIAGNOSIS — I351 Nonrheumatic aortic (valve) insufficiency: Secondary | ICD-10-CM

## 2023-08-20 DIAGNOSIS — G459 Transient cerebral ischemic attack, unspecified: Secondary | ICD-10-CM | POA: Diagnosis not present

## 2023-08-20 DIAGNOSIS — R55 Syncope and collapse: Secondary | ICD-10-CM

## 2023-08-20 DIAGNOSIS — I10 Essential (primary) hypertension: Secondary | ICD-10-CM

## 2023-08-20 DIAGNOSIS — R0789 Other chest pain: Secondary | ICD-10-CM | POA: Diagnosis not present

## 2023-08-20 DIAGNOSIS — I5033 Acute on chronic diastolic (congestive) heart failure: Secondary | ICD-10-CM

## 2023-09-04 ENCOUNTER — Ambulatory Visit

## 2023-09-04 DIAGNOSIS — I361 Nonrheumatic tricuspid (valve) insufficiency: Secondary | ICD-10-CM | POA: Diagnosis not present

## 2023-09-04 DIAGNOSIS — G459 Transient cerebral ischemic attack, unspecified: Secondary | ICD-10-CM

## 2023-09-04 DIAGNOSIS — R0789 Other chest pain: Secondary | ICD-10-CM

## 2023-09-04 DIAGNOSIS — I5033 Acute on chronic diastolic (congestive) heart failure: Secondary | ICD-10-CM

## 2023-09-04 DIAGNOSIS — I340 Nonrheumatic mitral (valve) insufficiency: Secondary | ICD-10-CM

## 2023-09-04 DIAGNOSIS — R55 Syncope and collapse: Secondary | ICD-10-CM

## 2023-09-04 DIAGNOSIS — I351 Nonrheumatic aortic (valve) insufficiency: Secondary | ICD-10-CM

## 2023-09-04 DIAGNOSIS — I10 Essential (primary) hypertension: Secondary | ICD-10-CM

## 2023-09-04 DIAGNOSIS — R42 Dizziness and giddiness: Secondary | ICD-10-CM

## 2023-09-15 ENCOUNTER — Ambulatory Visit: Admitting: Cardiovascular Disease

## 2023-09-17 ENCOUNTER — Ambulatory Visit: Admitting: Cardiovascular Disease

## 2023-09-17 ENCOUNTER — Encounter: Payer: Self-pay | Admitting: Cardiovascular Disease

## 2023-09-17 VITALS — BP 120/64 | HR 89 | Ht 65.0 in | Wt 161.0 lb

## 2023-09-17 DIAGNOSIS — G459 Transient cerebral ischemic attack, unspecified: Secondary | ICD-10-CM | POA: Diagnosis not present

## 2023-09-17 DIAGNOSIS — I351 Nonrheumatic aortic (valve) insufficiency: Secondary | ICD-10-CM

## 2023-09-17 DIAGNOSIS — I10 Essential (primary) hypertension: Secondary | ICD-10-CM | POA: Diagnosis not present

## 2023-09-17 DIAGNOSIS — R42 Dizziness and giddiness: Secondary | ICD-10-CM

## 2023-09-17 DIAGNOSIS — I5033 Acute on chronic diastolic (congestive) heart failure: Secondary | ICD-10-CM

## 2023-09-17 MED ORDER — DAPAGLIFLOZIN PROPANEDIOL 10 MG PO TABS
10.0000 mg | ORAL_TABLET | Freq: Every day | ORAL | 3 refills | Status: DC
Start: 2023-09-17 — End: 2023-11-10

## 2023-09-17 NOTE — Progress Notes (Signed)
 Cardiology Office Note   Date:  09/17/2023   ID:  [name], [name] 02-23-1946, MRN [id number]  PCP:  Omie Bickers, MD  Cardiologist:  Debborah Fairly, MD      History of Present Illness: [name] is a 78 y.o. female who presents for  Chief Complaint  Patient presents with   Follow-up    1 month NST/echo results    Has SOB on exertion.      Past Medical History:  Diagnosis Date   Arthritis    Current use of estrogen therapy 03/25/2013   Essential hypertension    GERD (gastroesophageal reflux disease)    H/O cardiac catheterization    2011: normal coronary arteries (false positive stress test).   Hyperlipidemia    Hypothyroidism    LBBB (left bundle branch block)    Nonischemic cardiomyopathy (HCC)    Orthostatic hypotension    associated with syncope   TIA (transient ischemic attack)    On Plavix      Past Surgical History:  Procedure Laterality Date   ABDOMINAL HYSTERECTOMY     COLONOSCOPY  12/25/2010   Procedure: COLONOSCOPY;  Surgeon: Ruby Corporal, MD;  Location: [facility];  Service: Endoscopy;  Laterality: N/A;   NECK SURGERY     Secondary to ruptured disc   PARTIAL HYSTERECTOMY     POSTERIOR CERVICAL FUSION/FORAMINOTOMY  12/18/2011   Procedure: POSTERIOR CERVICAL FUSION/FORAMINOTOMY LEVEL 2;  Surgeon: Corrina Dimitri, MD;  Location: [facility];  Service: Neurosurgery;  Laterality: Bilateral;  Cervical four to six Posterior cervical arthrodesis   WRIST FUSION     Right- torn ligament     Current Outpatient Medications  Medication Sig Dispense Refill   dapagliflozin  propanediol (FARXIGA ) 10 MG TABS tablet Take 1 tablet (10 mg total) by mouth daily before breakfast. 30 tablet 3   amLODipine  (NORVASC ) 10 MG tablet Take 1 tablet (10 mg total) by mouth daily. 30 tablet 11   carvedilol  (COREG ) 12.5 MG tablet Take 12.5 mg by mouth 2 (two) times daily with a meal.     clopidogrel  (PLAVIX ) 75 MG tablet Take 1 tablet (75 mg total) by mouth  daily. 30 tablet 12   HYDROcodone -acetaminophen  (NORCO/VICODIN) 5-325 MG tablet Take 1 tablet by mouth 2 (two) times daily.     Levothyroxine  Sodium 88 MCG CAPS Take 75 mcg by mouth daily before breakfast.      losartan  (COZAAR ) 50 MG tablet Take 1 tablet (50 mg total) by mouth 2 (two) times daily. 60 tablet 11   Multiple Vitamins-Minerals (CENTRUM ADULTS PO) Take 1 tablet by mouth daily.     pravastatin  (PRAVACHOL ) 20 MG tablet Take 1 tablet (20 mg total) by mouth daily. 30 tablet 12   No current facility-administered medications for this visit.    Allergies:   Codeine, Lisinopril , and Other    Social History:   reports that she has never smoked. She has never used smokeless tobacco. She reports that she does not drink alcohol and does not use drugs.   Family History:  family history includes Cancer in her brother; Colon cancer in her mother; Diabetes in her father; Lung cancer in her mother.    ROS:     Review of Systems  Constitutional: Negative.   HENT: Negative.    Eyes: Negative.   Respiratory: Negative.    Gastrointestinal: Negative.   Genitourinary: Negative.   Musculoskeletal: Negative.   Skin: Negative.   Neurological: Negative.   Endo/Heme/Allergies: Negative.  Psychiatric/Behavioral: Negative.    All other systems reviewed and are negative.     All other systems are reviewed and negative.    PHYSICAL EXAM: VS:  BP 120/64   Pulse 89   Ht 5\' 5"  (1.651 m)   Wt 161 lb (73 kg)   SpO2 96%   BMI 26.79 kg/m  , BMI Body mass index is 26.79 kg/m. Last weight:  Wt Readings from Last 3 Encounters:  09/17/23 161 lb (73 kg)  08/04/23 163 lb (73.9 kg)  06/04/23 156 lb (70.8 kg)     Physical Exam Constitutional:      Appearance: Normal appearance.  Cardiovascular:     Rate and Rhythm: Normal rate and regular rhythm.     Heart sounds: Normal heart sounds.  Pulmonary:     Effort: Pulmonary effort is normal.     Breath sounds: Normal breath sounds.   Musculoskeletal:     Right lower leg: No edema.     Left lower leg: No edema.  Neurological:     Mental Status: She is alert.       EKG:   Recent Labs: No results found for requested labs within last 365 days.    Lipid Panel    Component Value Date/Time   CHOL 140 02/06/2014 0616   TRIG 100 02/06/2014 0616   HDL 66 02/06/2014 0616   CHOLHDL 2.1 02/06/2014 0616   VLDL 20 02/06/2014 0616   LDLCALC 54 02/06/2014 0616      Other studies Reviewed: Additional studies/ records that were reviewed today include:  Review of the above records demonstrates:       No data to display            ASSESSMENT AND PLAN:    ICD-10-CM   1. Nonrheumatic aortic valve insufficiency  I35.1 dapagliflozin propanediol (FARXIGA) 10 MG TABS tablet    2. TIA (transient ischemic attack)  G45.9 dapagliflozin propanediol (FARXIGA) 10 MG TABS tablet    3. HYPERTENSION, BENIGN  I10 dapagliflozin propanediol (FARXIGA) 10 MG TABS tablet    4. Acute on chronic diastolic CHF (congestive heart failure), NYHA class 1 (HCC)  I50.33 dapagliflozin propanediol (FARXIGA) 10 MG TABS tablet   mild MR/TR, normal stress test. Has diastolic dysfunction, add farxiga    5. Dizziness  R42 dapagliflozin propanediol (FARXIGA) 10 MG TABS tablet   taking losartan  50 bis makes her feel less dizziness/       Problem List Items Addressed This Visit       Cardiovascular and Mediastinum   HYPERTENSION, BENIGN (Chronic)   Relevant Medications   dapagliflozin propanediol (FARXIGA) 10 MG TABS tablet   TIA (transient ischemic attack)   Relevant Medications   dapagliflozin propanediol (FARXIGA) 10 MG TABS tablet   Acute on chronic diastolic CHF (congestive heart failure), NYHA class 1 (HCC)   Relevant Medications   dapagliflozin propanediol (FARXIGA) 10 MG TABS tablet   Other Visit Diagnoses       Nonrheumatic aortic valve insufficiency    -  Primary   Relevant Medications   dapagliflozin propanediol  (FARXIGA) 10 MG TABS tablet     Dizziness       taking losartan  50 bis makes her feel less dizziness/   Relevant Medications   dapagliflozin propanediol (FARXIGA) 10 MG TABS tablet          Disposition:   No follow-ups on file.    Total time spent: 30 minutes  Signed,  Debborah Fairly, MD  09/17/2023 10:01  AM    [facility]

## 2023-10-12 DIAGNOSIS — R7301 Impaired fasting glucose: Secondary | ICD-10-CM | POA: Diagnosis not present

## 2023-10-12 DIAGNOSIS — E039 Hypothyroidism, unspecified: Secondary | ICD-10-CM | POA: Diagnosis not present

## 2023-10-12 DIAGNOSIS — E782 Mixed hyperlipidemia: Secondary | ICD-10-CM | POA: Diagnosis not present

## 2023-10-13 DIAGNOSIS — C44729 Squamous cell carcinoma of skin of left lower limb, including hip: Secondary | ICD-10-CM | POA: Diagnosis not present

## 2023-10-19 DIAGNOSIS — N1831 Chronic kidney disease, stage 3a: Secondary | ICD-10-CM | POA: Diagnosis not present

## 2023-10-19 DIAGNOSIS — E875 Hyperkalemia: Secondary | ICD-10-CM | POA: Diagnosis not present

## 2023-10-19 DIAGNOSIS — I5022 Chronic systolic (congestive) heart failure: Secondary | ICD-10-CM | POA: Diagnosis not present

## 2023-10-19 DIAGNOSIS — I447 Left bundle-branch block, unspecified: Secondary | ICD-10-CM | POA: Diagnosis not present

## 2023-10-19 DIAGNOSIS — I129 Hypertensive chronic kidney disease with stage 1 through stage 4 chronic kidney disease, or unspecified chronic kidney disease: Secondary | ICD-10-CM | POA: Diagnosis not present

## 2023-10-19 DIAGNOSIS — E039 Hypothyroidism, unspecified: Secondary | ICD-10-CM | POA: Diagnosis not present

## 2023-10-19 DIAGNOSIS — E782 Mixed hyperlipidemia: Secondary | ICD-10-CM | POA: Diagnosis not present

## 2023-10-19 DIAGNOSIS — I130 Hypertensive heart and chronic kidney disease with heart failure and stage 1 through stage 4 chronic kidney disease, or unspecified chronic kidney disease: Secondary | ICD-10-CM | POA: Diagnosis not present

## 2023-10-19 DIAGNOSIS — R55 Syncope and collapse: Secondary | ICD-10-CM | POA: Diagnosis not present

## 2023-10-19 DIAGNOSIS — M810 Age-related osteoporosis without current pathological fracture: Secondary | ICD-10-CM | POA: Diagnosis not present

## 2023-10-19 DIAGNOSIS — M542 Cervicalgia: Secondary | ICD-10-CM | POA: Diagnosis not present

## 2023-10-19 DIAGNOSIS — G4700 Insomnia, unspecified: Secondary | ICD-10-CM | POA: Diagnosis not present

## 2023-11-10 ENCOUNTER — Encounter: Payer: Self-pay | Admitting: Cardiovascular Disease

## 2023-11-10 ENCOUNTER — Ambulatory Visit: Admitting: Cardiovascular Disease

## 2023-11-10 VITALS — BP 124/83 | HR 91 | Ht 65.0 in | Wt 157.2 lb

## 2023-11-10 DIAGNOSIS — I5033 Acute on chronic diastolic (congestive) heart failure: Secondary | ICD-10-CM | POA: Diagnosis not present

## 2023-11-10 DIAGNOSIS — I951 Orthostatic hypotension: Secondary | ICD-10-CM

## 2023-11-10 DIAGNOSIS — G459 Transient cerebral ischemic attack, unspecified: Secondary | ICD-10-CM | POA: Diagnosis not present

## 2023-11-10 DIAGNOSIS — I10 Essential (primary) hypertension: Secondary | ICD-10-CM

## 2023-11-10 DIAGNOSIS — I5022 Chronic systolic (congestive) heart failure: Secondary | ICD-10-CM

## 2023-11-10 MED ORDER — AMLODIPINE BESYLATE 5 MG PO TABS: 5.0000 mg | ORAL_TABLET | Freq: Every day | ORAL | 11 refills | Status: DC

## 2023-11-10 NOTE — Progress Notes (Signed)
 Cardiology Office Note   Date:  11/10/2023   ID:  Laura, [name] 1945/08/13, MRN [id number]  PCP:  Shona Norleen PEDLAR, MD  Cardiologist:  Denyse Bathe, MD      History of Present Illness: [name] is a 78 y.o. female who presents for  Chief Complaint  Patient presents with   Follow-up    Pt is complaining SOB.    Feels lightheaded upon standing. No chest pains.      Past Medical History:  Diagnosis Date   Arthritis    Current use of estrogen therapy 03/25/2013   Essential hypertension    GERD (gastroesophageal reflux disease)    H/O cardiac catheterization    2011: normal coronary arteries (false positive stress test).   Hyperlipidemia    Hypothyroidism    LBBB (left bundle branch block)    Nonischemic cardiomyopathy (HCC)    Orthostatic hypotension    associated with syncope   TIA (transient ischemic attack)    On Plavix      Past Surgical History:  Procedure Laterality Date   ABDOMINAL HYSTERECTOMY     COLONOSCOPY  12/25/2010   Procedure: COLONOSCOPY;  Surgeon: Claudis RAYMOND Rivet, MD;  Location: [facility];  Service: Endoscopy;  Laterality: N/A;   NECK SURGERY     Secondary to ruptured disc   PARTIAL HYSTERECTOMY     POSTERIOR CERVICAL FUSION/FORAMINOTOMY  12/18/2011   Procedure: POSTERIOR CERVICAL FUSION/FORAMINOTOMY LEVEL 2;  Surgeon: Lamar LELON Peaches, MD;  Location: [facility];  Service: Neurosurgery;  Laterality: Bilateral;  Cervical four to six Posterior cervical arthrodesis   WRIST FUSION     Right- torn ligament     Current Outpatient Medications  Medication Sig Dispense Refill   amLODipine  (NORVASC ) 5 MG tablet Take 1 tablet (5 mg total) by mouth daily. 30 tablet 11   carvedilol  (COREG ) 12.5 MG tablet Take 12.5 mg by mouth 2 (two) times daily with a meal.     clopidogrel  (PLAVIX ) 75 MG tablet Take 1 tablet (75 mg total) by mouth daily. 30 tablet 12   Levothyroxine  Sodium 88 MCG CAPS Take 75 mcg by mouth daily before breakfast.       losartan  (COZAAR ) 50 MG tablet Take 1 tablet (50 mg total) by mouth 2 (two) times daily. 60 tablet 11   Multiple Vitamins-Minerals (CENTRUM ADULTS PO) Take 1 tablet by mouth daily.     pravastatin  (PRAVACHOL ) 20 MG tablet Take 1 tablet (20 mg total) by mouth daily. 30 tablet 12   No current facility-administered medications for this visit.    Allergies:   Codeine, Lisinopril , and Other    Social History:   reports that she has never smoked. She has never used smokeless tobacco. She reports that she does not drink alcohol and does not use drugs.   Family History:  family history includes Cancer in her brother; Colon cancer in her mother; Diabetes in her father; Lung cancer in her mother.    ROS:     Review of Systems  Constitutional: Negative.   HENT: Negative.    Eyes: Negative.   Respiratory: Negative.    Gastrointestinal: Negative.   Genitourinary: Negative.   Musculoskeletal: Negative.   Skin: Negative.   Neurological: Negative.   Endo/Heme/Allergies: Negative.   Psychiatric/Behavioral: Negative.    All other systems reviewed and are negative.     All other systems are reviewed and negative.    PHYSICAL EXAM: VS:  BP 124/83   Pulse 91  Ht 5' 5 (1.651 m)   Wt 157 lb 3.2 oz (71.3 kg)   SpO2 96%   BMI 26.16 kg/m  , BMI Body mass index is 26.16 kg/m. Last weight:  Wt Readings from Last 3 Encounters:  11/10/23 157 lb 3.2 oz (71.3 kg)  09/17/23 161 lb (73 kg)  08/04/23 163 lb (73.9 kg)     Physical Exam Constitutional:      Appearance: Normal appearance.  Cardiovascular:     Rate and Rhythm: Normal rate and regular rhythm.     Heart sounds: Normal heart sounds.  Pulmonary:     Effort: Pulmonary effort is normal.     Breath sounds: Normal breath sounds.  Musculoskeletal:     Right lower leg: No edema.     Left lower leg: No edema.  Neurological:     Mental Status: She is alert.       EKG:   Recent Labs: No results found for requested labs within  last 365 days.    Lipid Panel    Component Value Date/Time   CHOL 140 02/06/2014 0616   TRIG 100 02/06/2014 0616   HDL 66 02/06/2014 0616   CHOLHDL 2.1 02/06/2014 0616   VLDL 20 02/06/2014 0616   LDLCALC 54 02/06/2014 0616      Other studies Reviewed: Additional studies/ records that were reviewed today include:  Review of the above records demonstrates:       No data to display            ASSESSMENT AND PLAN:    ICD-10-CM   1. Orthostatic hypotension  I95.1 amLODipine  (NORVASC ) 5 MG tablet   Decrease amlodapine 5 mg daily    2. Acute on chronic diastolic CHF (congestive heart failure), NYHA class 1 (HCC)  I50.33 amLODipine  (NORVASC ) 5 MG tablet    3. Chronic systolic heart failure (HCC)  P49.77 amLODipine  (NORVASC ) 5 MG tablet    4. HYPERTENSION, BENIGN  I10 amLODipine  (NORVASC ) 5 MG tablet    5. TIA (transient ischemic attack)  G45.9 amLODipine  (NORVASC ) 5 MG tablet       Problem List Items Addressed This Visit       Cardiovascular and Mediastinum   HYPERTENSION, BENIGN (Chronic)   Relevant Medications   amLODipine  (NORVASC ) 5 MG tablet   TIA (transient ischemic attack)   Relevant Medications   amLODipine  (NORVASC ) 5 MG tablet   Orthostatic hypotension - Primary   Relevant Medications   amLODipine  (NORVASC ) 5 MG tablet   Acute on chronic diastolic CHF (congestive heart failure), NYHA class 1 (HCC)   Relevant Medications   amLODipine  (NORVASC ) 5 MG tablet   Chronic systolic heart failure (HCC)   Relevant Medications   amLODipine  (NORVASC ) 5 MG tablet       Disposition:   Return in about 3 months (around 02/10/2024).    Total time spent: 30 minutes  Signed,  Denyse Bathe, MD  11/10/2023 11:14 AM    [facility]

## 2023-11-12 DIAGNOSIS — N1831 Chronic kidney disease, stage 3a: Secondary | ICD-10-CM | POA: Diagnosis not present

## 2023-11-17 ENCOUNTER — Ambulatory Visit: Admitting: Cardiovascular Disease

## 2023-11-24 DIAGNOSIS — L980 Pyogenic granuloma: Secondary | ICD-10-CM | POA: Diagnosis not present

## 2023-11-24 DIAGNOSIS — Z08 Encounter for follow-up examination after completed treatment for malignant neoplasm: Secondary | ICD-10-CM | POA: Diagnosis not present

## 2023-11-24 DIAGNOSIS — Z85828 Personal history of other malignant neoplasm of skin: Secondary | ICD-10-CM | POA: Diagnosis not present

## 2023-11-25 ENCOUNTER — Encounter: Payer: Self-pay | Admitting: Cardiology

## 2023-11-25 ENCOUNTER — Other Ambulatory Visit: Payer: Self-pay | Admitting: Cardiovascular Disease

## 2023-11-25 ENCOUNTER — Ambulatory Visit: Admitting: Cardiology

## 2023-11-25 VITALS — BP 86/50 | HR 89 | Ht 65.0 in | Wt 154.2 lb

## 2023-11-25 DIAGNOSIS — R55 Syncope and collapse: Secondary | ICD-10-CM

## 2023-11-25 DIAGNOSIS — I951 Orthostatic hypotension: Secondary | ICD-10-CM | POA: Diagnosis not present

## 2023-11-25 NOTE — Progress Notes (Signed)
 Cardiology Office Note   Date:  11/25/2023   ID:  Raigen, Jagielski Oct 15, 1945, MRN [id number]  PCP:  Shona Norleen PEDLAR, MD  Cardiologist:  Jeoffrey Pollen, NP      History of Present Illness: [name] is a 78 y.o. female who presents for  Chief Complaint  Patient presents with   Acute Visit    Low Blood Pressure/passed out twice/ needs EKG    Patient in office for an acute visit. Patient reports passing out twice from low blood pressure.       Past Medical History:  Diagnosis Date   Arthritis    Current use of estrogen therapy 03/25/2013   Essential hypertension    GERD (gastroesophageal reflux disease)    H/O cardiac catheterization    2011: normal coronary arteries (false positive stress test).   Hyperlipidemia    Hypothyroidism    LBBB (left bundle branch block)    Nonischemic cardiomyopathy (HCC)    Orthostatic hypotension    associated with syncope   TIA (transient ischemic attack)    On Plavix      Past Surgical History:  Procedure Laterality Date   ABDOMINAL HYSTERECTOMY     COLONOSCOPY  12/25/2010   Procedure: COLONOSCOPY;  Surgeon: Claudis RAYMOND Rivet, MD;  Location: [facility];  Service: Endoscopy;  Laterality: N/A;   NECK SURGERY     Secondary to ruptured disc   PARTIAL HYSTERECTOMY     POSTERIOR CERVICAL FUSION/FORAMINOTOMY  12/18/2011   Procedure: POSTERIOR CERVICAL FUSION/FORAMINOTOMY LEVEL 2;  Surgeon: Lamar LELON Peaches, MD;  Location: [facility];  Service: Neurosurgery;  Laterality: Bilateral;  Cervical four to six Posterior cervical arthrodesis   WRIST FUSION     Right- torn ligament     Current Outpatient Medications  Medication Sig Dispense Refill   clopidogrel  (PLAVIX ) 75 MG tablet Take 1 tablet (75 mg total) by mouth daily. 30 tablet 12   Levothyroxine  Sodium 88 MCG CAPS Take 75 mcg by mouth daily before breakfast.      losartan -hydrochlorothiazide  (HYZAAR) 50-12.5 MG tablet Take 0.5 tablets by mouth daily.     Multiple  Vitamins-Minerals (CENTRUM ADULTS PO) Take 1 tablet by mouth daily.     pravastatin  (PRAVACHOL ) 20 MG tablet Take 1 tablet (20 mg total) by mouth daily. 30 tablet 12   carvedilol  (COREG ) 12.5 MG tablet Take 12.5 mg by mouth 2 (two) times daily with a meal. (Patient not taking: Reported on 11/25/2023)     No current facility-administered medications for this visit.    Allergies:   Codeine, Lisinopril , and Other    Social History:   reports that she has never smoked. She has never used smokeless tobacco. She reports that she does not drink alcohol and does not use drugs.   Family History:  family history includes Cancer in her brother; Colon cancer in her mother; Diabetes in her father; Lung cancer in her mother.    ROS:     Review of Systems  Constitutional: Negative.   HENT: Negative.    Eyes: Negative.   Respiratory: Negative.    Cardiovascular: Negative.   Gastrointestinal: Negative.   Genitourinary: Negative.   Musculoskeletal: Negative.   Skin: Negative.   Neurological:  Positive for dizziness and loss of consciousness.  Endo/Heme/Allergies: Negative.   Psychiatric/Behavioral: Negative.    All other systems reviewed and are negative.     All other systems are reviewed and negative.    PHYSICAL EXAM: VS:  BP ROLLEN)  86/50   Pulse 89   Ht 5' 5 (1.651 m)   Wt 154 lb 3.2 oz (69.9 kg)   SpO2 97%   BMI 25.66 kg/m  , BMI Body mass index is 25.66 kg/m. Last weight:  Wt Readings from Last 3 Encounters:  11/25/23 154 lb 3.2 oz (69.9 kg)  11/10/23 157 lb 3.2 oz (71.3 kg)  09/17/23 161 lb (73 kg)     Physical Exam Vitals and nursing note reviewed.  Constitutional:      Appearance: Normal appearance. She is normal weight.  HENT:     Head: Normocephalic and atraumatic.     Nose: Nose normal.     Mouth/Throat:     Mouth: Mucous membranes are moist.     Pharynx: Oropharynx is clear.  Eyes:     Conjunctiva/sclera: Conjunctivae normal.     Pupils: Pupils are equal,  round, and reactive to light.  Cardiovascular:     Rate and Rhythm: Normal rate and regular rhythm.     Pulses: Normal pulses.     Heart sounds: Normal heart sounds.  Pulmonary:     Effort: Pulmonary effort is normal.     Breath sounds: Normal breath sounds.  Abdominal:     General: Abdomen is flat. Bowel sounds are normal.     Palpations: Abdomen is soft.  Musculoskeletal:        General: Normal range of motion.     Cervical back: Normal range of motion.  Skin:    General: Skin is warm and dry.  Neurological:     General: No focal deficit present.     Mental Status: She is alert and oriented to person, place, and time. Mental status is at baseline.  Psychiatric:        Mood and Affect: Mood normal.        Behavior: Behavior normal.     EKG: NSR, LBBB, HR 74 bpm,   Recent Labs: No results found for requested labs within last 365 days.    Lipid Panel    Component Value Date/Time   CHOL 140 02/06/2014 0616   TRIG 100 02/06/2014 0616   HDL 66 02/06/2014 0616   CHOLHDL 2.1 02/06/2014 0616   VLDL 20 02/06/2014 0616   LDLCALC 54 02/06/2014 0616     ASSESSMENT AND PLAN:    ICD-10-CM   1. Orthostatic hypotension  I95.1     2. Syncope, unspecified syncope type  R55        Problem List Items Addressed This Visit       Cardiovascular and Mediastinum   Orthostatic hypotension - Primary   Patient reports two syncopal episodes this morning, felt weak prior to losing consciousness. Amlodipine  was decreased at previous visit. Will stop the amlodipine  today. Cut losartan /hydrochlorothiazide  in half. Patient not currently taking carvedilol , states she was told to stop it. No documentation of this found in the chart.  EKG today unchanged.       Relevant Medications   losartan -hydrochlorothiazide  (HYZAAR) 50-12.5 MG tablet     Other   Syncope   Two syncopal episodes today per patient/spouse. Will decrease amlodipine  and cut lisinopril /hydrochlorothiazide  in half.          Disposition:   Return in about 2 weeks (around 12/09/2023) for with SK.    Total time spent: 25 minutes  Signed,  Jeoffrey Pollen, NP  11/25/2023 11:45 AM    [facility]

## 2023-11-25 NOTE — Assessment & Plan Note (Addendum)
 Patient reports two syncopal episodes this morning, felt weak prior to losing consciousness. Amlodipine  was decreased at previous visit. Will stop the amlodipine  today. Cut losartan /hydrochlorothiazide  in half. Patient not currently taking carvedilol , states she was told to stop it. No documentation of this found in the chart.  EKG today unchanged.

## 2023-11-25 NOTE — Assessment & Plan Note (Signed)
 Two syncopal episodes today per patient/spouse. Will decrease amlodipine  and cut lisinopril /hydrochlorothiazide  in half.

## 2023-11-27 DIAGNOSIS — N1832 Chronic kidney disease, stage 3b: Secondary | ICD-10-CM | POA: Diagnosis not present

## 2023-11-27 DIAGNOSIS — E8722 Chronic metabolic acidosis: Secondary | ICD-10-CM | POA: Diagnosis not present

## 2023-11-27 DIAGNOSIS — E875 Hyperkalemia: Secondary | ICD-10-CM | POA: Diagnosis not present

## 2023-11-27 DIAGNOSIS — I5022 Chronic systolic (congestive) heart failure: Secondary | ICD-10-CM | POA: Diagnosis not present

## 2023-12-02 ENCOUNTER — Emergency Department (HOSPITAL_COMMUNITY)
Admission: EM | Admit: 2023-12-02 | Discharge: 2023-12-02 | Disposition: A | Discharge status: Home / Self Care | Attending: Emergency Medicine | Admitting: Emergency Medicine

## 2023-12-02 ENCOUNTER — Emergency Department (HOSPITAL_COMMUNITY)

## 2023-12-02 ENCOUNTER — Other Ambulatory Visit: Payer: Self-pay

## 2023-12-02 DIAGNOSIS — I5022 Chronic systolic (congestive) heart failure: Secondary | ICD-10-CM | POA: Insufficient documentation

## 2023-12-02 DIAGNOSIS — I10 Essential (primary) hypertension: Secondary | ICD-10-CM | POA: Diagnosis not present

## 2023-12-02 DIAGNOSIS — J189 Pneumonia, unspecified organism: Secondary | ICD-10-CM | POA: Diagnosis not present

## 2023-12-02 DIAGNOSIS — E039 Hypothyroidism, unspecified: Secondary | ICD-10-CM | POA: Diagnosis not present

## 2023-12-02 DIAGNOSIS — I951 Orthostatic hypotension: Secondary | ICD-10-CM | POA: Diagnosis not present

## 2023-12-02 DIAGNOSIS — Z79899 Other long term (current) drug therapy: Secondary | ICD-10-CM | POA: Diagnosis not present

## 2023-12-02 DIAGNOSIS — I110 Hypertensive heart disease with heart failure: Secondary | ICD-10-CM | POA: Insufficient documentation

## 2023-12-02 DIAGNOSIS — R918 Other nonspecific abnormal finding of lung field: Secondary | ICD-10-CM | POA: Diagnosis not present

## 2023-12-02 DIAGNOSIS — R079 Chest pain, unspecified: Secondary | ICD-10-CM | POA: Diagnosis not present

## 2023-12-02 DIAGNOSIS — R55 Syncope and collapse: Secondary | ICD-10-CM | POA: Diagnosis present

## 2023-12-02 DIAGNOSIS — J168 Pneumonia due to other specified infectious organisms: Secondary | ICD-10-CM | POA: Diagnosis not present

## 2023-12-02 DIAGNOSIS — I700 Atherosclerosis of aorta: Secondary | ICD-10-CM | POA: Diagnosis not present

## 2023-12-02 DIAGNOSIS — Z981 Arthrodesis status: Secondary | ICD-10-CM | POA: Diagnosis not present

## 2023-12-02 LAB — CBC
HCT: 33 % — ABNORMAL LOW (ref 36.0–46.0)
Hemoglobin: 11.7 g/dL — ABNORMAL LOW (ref 12.0–15.0)
MCH: 31.9 pg (ref 26.0–34.0)
MCHC: 35.5 g/dL (ref 30.0–36.0)
MCV: 89.9 fL (ref 80.0–100.0)
Platelets: 245 K/uL (ref 150–400)
RBC: 3.67 MIL/uL — ABNORMAL LOW (ref 3.87–5.11)
RDW: 12.3 % (ref 11.5–15.5)
WBC: 7.3 K/uL (ref 4.0–10.5)
nRBC: 0 % (ref 0.0–0.2)

## 2023-12-02 LAB — T4, FREE: Free T4: 1.54 ng/dL — ABNORMAL HIGH (ref 0.61–1.12)

## 2023-12-02 LAB — TROPONIN I (HIGH SENSITIVITY)
Troponin I (High Sensitivity): 6 ng/L (ref <18)
Troponin I (High Sensitivity): 6 ng/L (ref <18)

## 2023-12-02 LAB — TSH: TSH: 0.369 u[IU]/mL (ref 0.350–4.500)

## 2023-12-02 LAB — BASIC METABOLIC PANEL WITH GFR
Anion gap: 10 (ref 5–15)
BUN: 20 mg/dL (ref 8–23)
CO2: 25 mmol/L (ref 22–32)
Calcium: 9.6 mg/dL (ref 8.9–10.3)
Chloride: 98 mmol/L (ref 98–111)
Creatinine, Ser: 1.31 mg/dL — ABNORMAL HIGH (ref 0.44–1.00)
GFR, Estimated: 42 mL/min — ABNORMAL LOW (ref >60)
Glucose, Bld: 89 mg/dL (ref 70–99)
Potassium: 4.4 mmol/L (ref 3.5–5.1)
Sodium: 133 mmol/L — ABNORMAL LOW (ref 135–145)

## 2023-12-02 MED ORDER — IOHEXOL 350 MG/ML SOLN: 75.0000 mL | Freq: Once | INTRAVENOUS | Status: DC | PRN

## 2023-12-02 MED ORDER — IOHEXOL 350 MG/ML SOLN
60.0000 mL | Freq: Once | INTRAVENOUS | Status: AC | PRN
  Administered 2023-12-02 (×2): 60 mL via INTRAVENOUS

## 2023-12-02 MED ORDER — DOXYCYCLINE HYCLATE 100 MG PO CAPS: 100.0000 mg | ORAL_CAPSULE | Freq: Two times a day (BID) | ORAL | 0 refills | Status: DC

## 2023-12-02 NOTE — ED Provider Triage Note (Signed)
 Emergency Medicine Provider Triage Evaluation Note  [name] , a 78 y.o. female  was evaluated in triage.  Pt complains of lightheadedness and near syncope.  History of orthostatic hypotension has had symptoms for quite some time now, but seemingly becoming more symptomatic over the past week or 2.  Recently decreased antihypertensives, but still symptomatic.  Was ambulating to the bathroom early this morning when she became lightheaded and lowered herself to the ground.  Did not pass out.  Noted to have some mild chest pain during episode, none currently.  Asymptomatic sitting in chair  Review of Systems  Positive: Lightheadedness Negative: Shortness of breath  Physical Exam  BP (!) 163/78 (BP Location: Right Arm)   Pulse 80   Temp 97.7 F (36.5 C) (Oral)   Resp 16   SpO2 98%  Gen:   Awake, no distress   Resp:  Normal effort  MSK:   Moves extremities without difficulty  Other:  Clear lungs  Medical Decision Making  Medically screening exam initiated at 9:04 AM.  Appropriate orders placed.  [name] was informed that the remainder of the evaluation will be completed by another provider, this initial triage assessment does not replace that evaluation, and the importance of remaining in the ED until their evaluation is complete.  78 year old female with lightheadedness/near syncope.  Seemingly a chronic issue with orthostatic hypotension, but has been progressively worsening.  Had some chest pain last night.  Will get basic cardiac labs.   Laura Caron PARAS, DO 12/02/23 [phone]

## 2023-12-02 NOTE — Group Note (Deleted)
 Date:  12/02/2023 Time:  2:22 PM  Group Topic/Focus:  Wellness Toolbox:   The focus of this group is to discuss various aspects of wellness, balancing those aspects and exploring ways to increase the ability to experience wellness.  Patients will create a wellness toolbox for use upon discharge.     Participation Level:  {BHH PARTICIPATION OZCZO:77735}  Participation Quality:  {BHH PARTICIPATION QUALITY:22265}  Affect:  {BHH AFFECT:22266}  Cognitive:  {BHH COGNITIVE:22267}  Insight: {BHH Insight2:20797}  Engagement in Group:  {BHH ENGAGEMENT IN HMNLE:77731}  Modes of Intervention:  {BHH MODES OF INTERVENTION:22269}  Additional Comments:  ***  Myra Curtistine BROCKS 12/02/2023, 2:22 PM

## 2023-12-02 NOTE — ED Provider Notes (Signed)
 [place] EMERGENCY DEPARTMENT AT [facility] Provider Note   CSN: [id number] Arrival date & time: 12/02/23  0845     Patient presents with: Hypotension, Dizziness, Loss of Consciousness, and Chest Pain   [name] is a 78 y.o. female.   HPI     78 year old female with a history of hypertension, TIA chronic systolic heart failure, hyperlipidemia, orthostatic hypotension who presents with concern for near-syncopal episode.   Got to bathroom, used it at 3AM, when stood up, legs started shaking an feel lightheaded. When feel like that will go onto the knees so she doesn't fall.  This morning had to crawl back to the bed because if stool up would feel lightheaded and fall.  BP has been up and down.  Dr. Fernand performed tests.   Has had a lot of pain in neck and shoulders since surgery in 2013, went in and having pain ever since.  Under a lot of stress with husband.  On and off for 2-3 years, but is progressing. Always happens when sitting to standing up.   Yesterday was outside, didn't walk very far, 45-50 steps then had to sit down.  BP has been going up and down.  Hard to say how many episodes a day but becoming more frequent. Is having syncope sometimes, thinks would have this AM but crawled instead, last Wednesday had 2 episodes.   Has been more winded lately, had some chest pain last night.  No chest pain today.  Feels clammy.   Echo from May 2025 with normal left ventricular systolic function, mild left ventricular hypertrophy with grade 1 diastolic dysfunction, mild to moderate tricuspid regurgitation, mild mitral regurgitation  Transferring care to Dr. Alvan [facility] but appt in November  Past Medical History:  Diagnosis Date   Arthritis    Current use of estrogen therapy 03/25/2013   Essential hypertension    GERD (gastroesophageal reflux disease)    H/O cardiac catheterization    2011: normal coronary arteries (false positive stress test).    Hyperlipidemia    Hypothyroidism    LBBB (left bundle branch block)    Nonischemic cardiomyopathy (HCC)    Orthostatic hypotension    associated with syncope   TIA (transient ischemic attack)    On Plavix     Prior to Admission medications   Medication Sig Start Date End Date Taking? Authorizing Provider  acetaminophen  (TYLENOL ) 500 MG tablet Take 1,000 mg by mouth every 6 (six) hours as needed.   Yes [provider]  Calcium  Carb-Cholecalciferol (CALCIUM  1000 + D PO) Take by mouth.   Yes [provider]  clopidogrel  (PLAVIX ) 75 MG tablet Take 1 tablet (75 mg total) by mouth daily. 02/07/14  Yes Vonzell Loving, MD  doxycycline  (VIBRAMYCIN ) 100 MG capsule Take 1 capsule (100 mg total) by mouth 2 (two) times daily. 12/02/23  Yes Dreama Longs, MD  Levothyroxine  Sodium 88 MCG CAPS Take 75 mcg by mouth daily before breakfast.    Yes [provider]  losartan -hydrochlorothiazide  (HYZAAR) 50-12.5 MG tablet Take 0.5 tablets by mouth daily.   Yes [provider]  Multiple Vitamins-Minerals (CENTRUM ADULTS PO) Take 1 tablet by mouth daily.   Yes [provider]  pravastatin  (PRAVACHOL ) 20 MG tablet Take 1 tablet (20 mg total) by mouth daily. 12/07/13  Yes Vonzell Loving, MD    Allergies: Codeine, Lisinopril , and Other    Review of Systems  Updated Vital Signs BP (!) 199/81   Pulse 82  Temp 97.8 F (36.6 C) (Oral)   Resp (!) 23   Ht 5' 5 (1.651 m)   Wt 69.9 kg   SpO2 99%   BMI 25.63 kg/m   Physical Exam Vitals and nursing note reviewed.  Constitutional:      General: She is not in acute distress.    Appearance: Normal appearance. She is well-developed. She is not ill-appearing or diaphoretic.  HENT:     Head: Normocephalic and atraumatic.  Eyes:     General: No visual field deficit.    Extraocular Movements: Extraocular movements intact.     Conjunctiva/sclera: Conjunctivae normal.     Pupils: Pupils are equal, round, and reactive  to light.  Cardiovascular:     Rate and Rhythm: Normal rate and regular rhythm.     Pulses: Normal pulses.     Heart sounds: Normal heart sounds. No murmur heard.    No friction rub. No gallop.  Pulmonary:     Effort: Pulmonary effort is normal. No respiratory distress.     Breath sounds: Normal breath sounds. No wheezing or rales.  Abdominal:     General: There is no distension.     Palpations: Abdomen is soft.     Tenderness: There is no abdominal tenderness. There is no guarding.  Musculoskeletal:        General: No swelling or tenderness.     Cervical back: Normal range of motion.  Skin:    General: Skin is warm and dry.     Findings: No erythema or rash.  Neurological:     General: No focal deficit present.     Mental Status: She is alert and oriented to person, place, and time.     GCS: GCS eye subscore is 4. GCS verbal subscore is 5. GCS motor subscore is 6.     Cranial Nerves: No cranial nerve deficit, dysarthria or facial asymmetry.     Sensory: No sensory deficit.     Motor: No weakness or tremor.     Coordination: Coordination normal. Finger-Nose-Finger Test normal.     Gait: Gait normal.     (all labs ordered are listed, but only abnormal results are displayed) Labs Reviewed  CBC - Abnormal; Notable for the following components:      Result Value   RBC 3.67 (*)    Hemoglobin 11.7 (*)    HCT 33.0 (*)    All other components within normal limits  BASIC METABOLIC PANEL WITH GFR - Abnormal; Notable for the following components:   Sodium 133 (*)    Creatinine, Ser 1.31 (*)    GFR, Estimated 42 (*)    All other components within normal limits  T4, FREE - Abnormal; Notable for the following components:   Free T4 1.54 (*)    All other components within normal limits  TSH  TROPONIN I (HIGH SENSITIVITY)  TROPONIN I (HIGH SENSITIVITY)    EKG: EKG Interpretation Date/Time:  Wednesday December 02 2023 08:54:29 EDT Ventricular Rate:  81 PR Interval:  172 QRS  Duration:  128 QT Interval:  422 QTC Calculation: 490 R Axis:   49  Text Interpretation: Normal sinus rhythm Left ventricular hypertrophy with QRS widening and repolarization abnormality ( Cornell product ) Cannot rule out Anteroseptal infarct , age undetermined Abnormal ECG When compared with ECG of 24-Apr-2019 12:06, No significant change since last tracing Confirmed by Dreama Longs ([id number]) on 12/02/2023 11:01:11 AM  Radiology: CT Angio Chest PE W and/or Wo Contrast Result Date: 12/02/2023  CLINICAL DATA:  High probability for PE. EXAM: CT ANGIOGRAPHY CHEST WITH CONTRAST TECHNIQUE: Multidetector CT imaging of the chest was performed using the standard protocol during bolus administration of intravenous contrast. Multiplanar CT image reconstructions and MIPs were obtained to evaluate the vascular anatomy. RADIATION DOSE REDUCTION: This exam was performed according to the departmental dose-optimization program which includes automated exposure control, adjustment of the mA and/or kV according to patient size and/or use of iterative reconstruction technique. CONTRAST:  60mL OMNIPAQUE  IOHEXOL  350 MG/ML SOLN COMPARISON:  None Available. FINDINGS: Cardiovascular: Satisfactory opacification of the pulmonary arteries to the segmental level. No evidence of pulmonary embolism. Normal heart size. No pericardial effusion. There are atherosclerotic calcifications of the aorta. 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 Mediastinum/Nodes: No enlarged mediastinal, hilar, or axillary lymph nodes. Thyroid  gland, trachea, and esophagus demonstrate no significant findings. Lungs/Pleura: There are tree-in-bud opacities and ill-defined nodular densities in the anterior left upper lobe, likely infectious/inflammatory. There is a small amount of atelectasis or airspace disease in the lingula and medial aspect of the right middle lobe. There is no pleural effusion or pneumothorax. Upper Abdomen: No acute abnormality.  Musculoskeletal: No chest wall abnormality. No acute or significant osseous findings. Review of the MIP images confirms the above findings. IMPRESSION: 1. No evidence for pulmonary embolism. 2. Tree-in-bud opacities and ill-defined nodular densities in the anterior left upper lobe, likely infectious/inflammatory. 3. Small amount of atelectasis or airspace disease in the lingula and medial aspect of the right middle lobe. 4. Aortic atherosclerosis. Aortic Atherosclerosis (ICD10-I70.0). Electronically Signed   By: Greig Pique M.D.   On: 12/02/2023 15:14   DG Chest 2 View Result Date: 12/02/2023 CLINICAL DATA:  Chest pain. EXAM: CHEST - 2 VIEW COMPARISON:  04/18/2019. FINDINGS: The heart size and mediastinal contours are within normal limits. Mild linear scarring in the right middle lobe. No focal consolidation, pleural effusion, or pneumothorax. Partially visualized cervical fusion hardware. No acute osseous abnormality. IMPRESSION: Mild linear scarring in the right middle lobe. Otherwise, no acute cardiopulmonary findings. Electronically Signed   By: Harrietta Sherry M.D.   On: 12/02/2023 10:20     Procedures   Medications Ordered in the ED  iohexol  (OMNIPAQUE ) 350 MG/ML injection 60 mL (60 mLs Intravenous Contrast Given 12/02/23 1348)                                     78 year old female with a history of hypertension, TIA chronic systolic heart failure, hyperlipidemia, orthostatic hypotension who presents with concern for near-syncope and increased frequency of near-syncopal episodes.  DDx includes orthostatic hypotension, other vagal, infection, electrolyte abnormality, anemia, ACS, arrhythmia, PE, dissection.  No focal neurologic symptoms or signs and doubt acute CVA. Low clinical suspicion for dissection.    EKG evaluated by me and shows no acute changes.  Labs completed and personally evaluated interpreted by me show mild anemia, no leukocytosis, normal troponin, no clinically  significant electrolyte abnormalities.  Chest x-ray personally evaluated interpreted by me and radiology shows mild linear scarring without acute findings including no pneumonia, pneumothorax or pulmonary edema.  Given increasing dyspnea, chest discomfort and near syncope, concern for high risk of PE and CT PE study ordered.    CT without signs of PE, does show likely infectious/inflammatory densities in anterior left upper lobe. Possible increased dyspnea and lightheadedness may relate to pneumonia given CT findings. Will treat with doxycycline .  Has elevated BP however no signs of hypertensive emergency.  Given lightheadedness occurring in setting of position changes and history of similar, suspect symptoms related to orthostatic hypotension.  BP from 200s systolic to 120s from sitting to standing.  Difficult situation in setting of signiicant orthostatic bp changes and elevated blood pressures.  Discussed with Cardiology, will allow higher blood pressures.  Recommend compression stockings, hydration, slow position changes and Cardiology follow up. Recommend continuing current regimen for hypertension.  Patient discharged in stable condition with understanding of reasons to return.     Final diagnoses:  Orthostatic hypotension  Hypertension, unspecified type  Pneumonia due to infectious organism, unspecified laterality, unspecified part of lung    ED Discharge Orders          Ordered    doxycycline  (VIBRAMYCIN ) 100 MG capsule  2 times daily        12/02/23 1614    Ambulatory referral to Cardiology       Comments: Another location or provider would be ok, this is preference for next available.   12/02/23 1614               Dreama Longs, MD 12/02/23 2336

## 2023-12-02 NOTE — ED Triage Notes (Signed)
 Pt/daughter stated, for a year Ive had dizziness with  high to low BP . I will go unconscious and end up crawling to somewhere to get up. I see Dr, Clarence rower I have a leaking valve. I started having these symptoms for a year and they are getting worse.

## 2023-12-07 ENCOUNTER — Ambulatory Visit: Admitting: Cardiovascular Disease

## 2023-12-23 DIAGNOSIS — G4700 Insomnia, unspecified: Secondary | ICD-10-CM | POA: Diagnosis not present

## 2023-12-23 DIAGNOSIS — E875 Hyperkalemia: Secondary | ICD-10-CM | POA: Diagnosis not present

## 2023-12-23 DIAGNOSIS — Z23 Encounter for immunization: Secondary | ICD-10-CM | POA: Diagnosis not present

## 2023-12-23 DIAGNOSIS — I447 Left bundle-branch block, unspecified: Secondary | ICD-10-CM | POA: Diagnosis not present

## 2023-12-23 DIAGNOSIS — I5022 Chronic systolic (congestive) heart failure: Secondary | ICD-10-CM | POA: Diagnosis not present

## 2023-12-23 DIAGNOSIS — N1831 Chronic kidney disease, stage 3a: Secondary | ICD-10-CM | POA: Diagnosis not present

## 2023-12-23 DIAGNOSIS — I951 Orthostatic hypotension: Secondary | ICD-10-CM | POA: Diagnosis not present

## 2023-12-23 DIAGNOSIS — E039 Hypothyroidism, unspecified: Secondary | ICD-10-CM | POA: Diagnosis not present

## 2023-12-23 DIAGNOSIS — I130 Hypertensive heart and chronic kidney disease with heart failure and stage 1 through stage 4 chronic kidney disease, or unspecified chronic kidney disease: Secondary | ICD-10-CM | POA: Diagnosis not present

## 2023-12-23 DIAGNOSIS — I129 Hypertensive chronic kidney disease with stage 1 through stage 4 chronic kidney disease, or unspecified chronic kidney disease: Secondary | ICD-10-CM | POA: Diagnosis not present

## 2023-12-23 DIAGNOSIS — J189 Pneumonia, unspecified organism: Secondary | ICD-10-CM | POA: Diagnosis not present

## 2024-01-11 DIAGNOSIS — H40011 Open angle with borderline findings, low risk, right eye: Secondary | ICD-10-CM | POA: Diagnosis not present

## 2024-02-02 DIAGNOSIS — E782 Mixed hyperlipidemia: Secondary | ICD-10-CM | POA: Diagnosis not present

## 2024-02-02 DIAGNOSIS — E039 Hypothyroidism, unspecified: Secondary | ICD-10-CM | POA: Diagnosis not present

## 2024-02-02 DIAGNOSIS — R7301 Impaired fasting glucose: Secondary | ICD-10-CM | POA: Diagnosis not present

## 2024-02-08 DIAGNOSIS — G8929 Other chronic pain: Secondary | ICD-10-CM | POA: Diagnosis not present

## 2024-02-08 DIAGNOSIS — Z8701 Personal history of pneumonia (recurrent): Secondary | ICD-10-CM | POA: Diagnosis not present

## 2024-02-08 DIAGNOSIS — M542 Cervicalgia: Secondary | ICD-10-CM | POA: Diagnosis not present

## 2024-02-08 DIAGNOSIS — I130 Hypertensive heart and chronic kidney disease with heart failure and stage 1 through stage 4 chronic kidney disease, or unspecified chronic kidney disease: Secondary | ICD-10-CM | POA: Diagnosis not present

## 2024-02-08 DIAGNOSIS — N1831 Chronic kidney disease, stage 3a: Secondary | ICD-10-CM | POA: Diagnosis not present

## 2024-02-08 DIAGNOSIS — E039 Hypothyroidism, unspecified: Secondary | ICD-10-CM | POA: Diagnosis not present

## 2024-02-08 DIAGNOSIS — Z87898 Personal history of other specified conditions: Secondary | ICD-10-CM | POA: Diagnosis not present

## 2024-02-08 DIAGNOSIS — I5022 Chronic systolic (congestive) heart failure: Secondary | ICD-10-CM | POA: Diagnosis not present

## 2024-02-08 DIAGNOSIS — E875 Hyperkalemia: Secondary | ICD-10-CM | POA: Diagnosis not present

## 2024-02-08 DIAGNOSIS — I447 Left bundle-branch block, unspecified: Secondary | ICD-10-CM | POA: Diagnosis not present

## 2024-02-08 DIAGNOSIS — Z0000 Encounter for general adult medical examination without abnormal findings: Secondary | ICD-10-CM | POA: Diagnosis not present

## 2024-02-08 DIAGNOSIS — I951 Orthostatic hypotension: Secondary | ICD-10-CM | POA: Diagnosis not present

## 2024-02-09 ENCOUNTER — Encounter: Payer: Self-pay | Admitting: Internal Medicine

## 2024-02-11 ENCOUNTER — Encounter: Payer: Self-pay | Admitting: Cardiology

## 2024-02-11 ENCOUNTER — Ambulatory Visit: Attending: Cardiology | Admitting: Cardiology

## 2024-02-11 VITALS — BP 180/80 | HR 71 | Ht 64.0 in | Wt 158.0 lb

## 2024-02-11 DIAGNOSIS — I951 Orthostatic hypotension: Secondary | ICD-10-CM | POA: Diagnosis not present

## 2024-02-11 DIAGNOSIS — I10 Essential (primary) hypertension: Secondary | ICD-10-CM | POA: Diagnosis not present

## 2024-02-11 MED ORDER — LOSARTAN POTASSIUM 50 MG PO TABS: 50.0000 mg | ORAL_TABLET | Freq: Every day | ORAL | 6 refills | Status: AC

## 2024-02-11 NOTE — Progress Notes (Signed)
 Clinical Summary [name] is a 78 y.o.female seen today as a new patient for the following medical problems.   Prevoiusly followed by [facility]   1.Orthostatic hypotension/Systemic HTN - several year history - history of syncope in the past thought secondary to orthostasis - previous seen by [facility], notes are in epic but limited without providing complete history  - norvasc  lowered previously by [facility] and later stopped, losartan /hydrochlorothiazide  was lowered to half dose however she reports still taking the full tablet.   BP from 200s systolic to 120s from sitting to standing during 11/2023 ER visit  - working to stay hydrated.  - coffee x 1 large cup, body armor 20 oz x 1 - has compression socks     2. Chronic LBBB - 2011 cath normal coronaries   3. SOB - typically comes with activity - no coughing, no wheezing. No smoking history - benign echo and stress test as reported below Past Medical History:  Diagnosis Date   Arthritis    Current use of estrogen therapy 03/25/2013   Essential hypertension    GERD (gastroesophageal reflux disease)    H/O cardiac catheterization    2011: normal coronary arteries (false positive stress test).   Hyperlipidemia    Hypothyroidism    LBBB (left bundle Ermine [place]off[place] block)    Nonischemic cardiomyopathy (HCC)    Orthostatic hypotension    associated with syncope   TIA (transient ischemic attack)    On Plavix      Allergies  Allergen Reactions   Codeine Nausea And Vomiting   Lisinopril  Cough   Other Nausea And Vomiting    Most anesthesia      Current Outpatient Medications  Medication Sig Dispense Refill   acetaminophen  (TYLENOL ) 500 MG tablet Take 1,000 mg by mouth every 6 (six) hours as needed.     Levothyroxine  Sodium 88 MCG CAPS Take 75 mcg by mouth daily before breakfast.      Multiple Vitamins-Minerals (CENTRUM ADULTS PO) Take 1 tablet by mouth daily.     pravastatin   (PRAVACHOL ) 20 MG tablet Take 1 tablet (20 mg total) by mouth daily. 30 tablet 12   Calcium  Carb-Cholecalciferol (CALCIUM  1000 + D PO) Take by mouth. (Patient not taking: Reported on 02/11/2024)     clopidogrel  (PLAVIX ) 75 MG tablet Take 1 tablet (75 mg total) by mouth daily. (Patient not taking: Reported on 02/11/2024) 30 tablet 12   doxycycline  (VIBRAMYCIN ) 100 MG capsule Take 1 capsule (100 mg total) by mouth 2 (two) times daily. (Patient not taking: Reported on 02/11/2024) 20 capsule 0   No current facility-administered medications for this visit.     Past Surgical History:  Procedure Laterality Date   ABDOMINAL HYSTERECTOMY     COLONOSCOPY  12/25/2010   Procedure: COLONOSCOPY;  Surgeon: Claudis RAYMOND Rivet, MD;  Location: [facility];  Service: Endoscopy;  Laterality: N/A;   NECK SURGERY     Secondary to ruptured disc   PARTIAL HYSTERECTOMY     POSTERIOR CERVICAL FUSION/FORAMINOTOMY  12/18/2011   Procedure: POSTERIOR CERVICAL FUSION/FORAMINOTOMY LEVEL 2;  Surgeon: Lamar LELON Peaches, MD;  Location: [facility];  Service: Neurosurgery;  Laterality: Bilateral;  Cervical four to six Posterior cervical arthrodesis   WRIST FUSION     Right- torn ligament     Allergies  Allergen Reactions   Codeine Nausea And Vomiting   Lisinopril  Cough   Other Nausea And Vomiting    Most anesthesia  Family History  Problem Relation Age of Onset   Colon cancer Mother    Lung cancer Mother    Diabetes Father    Cancer Brother        Back     Social History Ms. Jesson reports that she has never smoked. She has never used smokeless tobacco. Ms. Montilla reports no history of alcohol use.    Physical Examination Today's Vitals   02/11/24 1314 02/11/24 1352  BP: (!) 198/96 (!) 180/80  Pulse: 71   SpO2: 97%   Weight: 158 lb (71.7 kg)   Height: 5' 4 (1.626 m)    Body mass index is 27.12 kg/m.  Gen: resting comfortably, no acute distress HEENT: no scleral icterus, pupils equal  round and reactive, no palptable cervical adenopathy,  CV: RRR, no m/rg, no jvd Resp: Clear to auscultation bilaterally GI: abdomen is soft, non-tender, non-distended, normal bowel sounds, no hepatosplenomegaly MSK: extremities are warm, no edema.  Skin: warm, no rash Neuro:  no focal deficits Psych: appropriate affect   Diagnostic Studies  [facility] studies 08/2023 echo ASSESSMENT  Technically adequate study.  Normal chamber sizes.  Normal left ventricular systolic function.  Mild left ventricular hypertrophy with GRADE 1  (relaxation abnormality)  diastolic dysfunction.  Normal right ventricular systolic function.  Normal right ventricular diastolic function.  Normal left ventricular wall motion.  Normal right ventricular wall motion.  Mild/Moderate tricuspid regurgitation.  Moderate pulmonary hypertension.  Mild mitral regurgitation.  No pericardial effusion.   08/2023 nuclear stress: no ischemia  08/2023 carotid US  Minimal plaque   Assessment and Plan   1.Orthostatic hypotension/Systemic hypertension - long history of up and down bp's, intermittent orthostatic syncope - orthostastiscs today show SBP 200 lying, 135 standing.  - currently limited hydration, discussed much more aggressive fluid intake including electrolytes - encouraged compressoin stocking use, she is going to check with medical supply if perhaps more comfortable fit is available. May consider abdominal binder.  - we will stop her hydrochlorothiazide , continue her losartan  alone.  - check AM cortisol. She had a recent normal TSH - if difficult to balance may consider referral to HTN clinic      [name], M.D.

## 2024-02-11 NOTE — Patient Instructions (Addendum)
 Medication Instructions:   Stop Hyzaar (Losartan /hydrochlorothiazide ) Begin Losartan  50mg  daily  Continue all other medications.     Labwork:  AM Cortisol level  - order given today Office will contact with results via phone, letter or mychart.     Testing/Procedures:  none  Follow-Up:  3 weeks   Any Other Special Instructions Will Be Listed Below (If Applicable).   If you need a refill on your cardiac medications before your next appointment, please call your pharmacy.

## 2024-02-12 ENCOUNTER — Other Ambulatory Visit: Payer: Self-pay | Admitting: Cardiology

## 2024-02-12 DIAGNOSIS — I951 Orthostatic hypotension: Secondary | ICD-10-CM | POA: Diagnosis not present

## 2024-02-15 ENCOUNTER — Ambulatory Visit: Admitting: Cardiovascular Disease

## 2024-02-15 LAB — CORTISOL, AM/PM
Cortisol - AM: 9.1 ug/dL (ref 6.2–19.4)
Cortisol - PM: 4.9 ug/dL (ref 2.3–11.9)

## 2024-02-25 DIAGNOSIS — R809 Proteinuria, unspecified: Secondary | ICD-10-CM | POA: Diagnosis not present

## 2024-02-25 DIAGNOSIS — N1832 Chronic kidney disease, stage 3b: Secondary | ICD-10-CM | POA: Diagnosis not present

## 2024-02-25 DIAGNOSIS — E8722 Chronic metabolic acidosis: Secondary | ICD-10-CM | POA: Diagnosis not present

## 2024-02-25 DIAGNOSIS — E875 Hyperkalemia: Secondary | ICD-10-CM | POA: Diagnosis not present

## 2024-02-25 DIAGNOSIS — R7303 Prediabetes: Secondary | ICD-10-CM | POA: Diagnosis not present

## 2024-02-25 DIAGNOSIS — E876 Hypokalemia: Secondary | ICD-10-CM | POA: Diagnosis not present

## 2024-02-25 DIAGNOSIS — N189 Chronic kidney disease, unspecified: Secondary | ICD-10-CM | POA: Diagnosis not present

## 2024-02-25 DIAGNOSIS — I130 Hypertensive heart and chronic kidney disease with heart failure and stage 1 through stage 4 chronic kidney disease, or unspecified chronic kidney disease: Secondary | ICD-10-CM | POA: Diagnosis not present

## 2024-02-25 DIAGNOSIS — D631 Anemia in chronic kidney disease: Secondary | ICD-10-CM | POA: Diagnosis not present

## 2024-02-25 DIAGNOSIS — E119 Type 2 diabetes mellitus without complications: Secondary | ICD-10-CM | POA: Diagnosis not present

## 2024-02-25 DIAGNOSIS — I5022 Chronic systolic (congestive) heart failure: Secondary | ICD-10-CM | POA: Diagnosis not present

## 2024-02-26 ENCOUNTER — Ambulatory Visit: Payer: Self-pay | Admitting: Cardiology

## 2024-02-29 NOTE — Telephone Encounter (Signed)
 Notified, copy to pcp.

## 2024-02-29 NOTE — Telephone Encounter (Signed)
-----   Message from Alvan Carrier sent at 02/26/2024  2:21 PM EST ----- Cortisol level looks fine  JINNY Alvan MD ----- Message ----- From: Rebecka Memos Lab Results In Sent: 02/15/2024   7:37 PM EST To: Carrier JULIANNA Alvan, MD

## 2024-03-04 ENCOUNTER — Other Ambulatory Visit (HOSPITAL_COMMUNITY): Payer: Self-pay | Admitting: Nephrology

## 2024-03-04 DIAGNOSIS — I130 Hypertensive heart and chronic kidney disease with heart failure and stage 1 through stage 4 chronic kidney disease, or unspecified chronic kidney disease: Secondary | ICD-10-CM | POA: Diagnosis not present

## 2024-03-04 DIAGNOSIS — R809 Proteinuria, unspecified: Secondary | ICD-10-CM

## 2024-03-04 DIAGNOSIS — N1832 Chronic kidney disease, stage 3b: Secondary | ICD-10-CM

## 2024-03-04 DIAGNOSIS — N1831 Chronic kidney disease, stage 3a: Secondary | ICD-10-CM | POA: Diagnosis not present

## 2024-03-04 DIAGNOSIS — I5022 Chronic systolic (congestive) heart failure: Secondary | ICD-10-CM | POA: Diagnosis not present

## 2024-03-07 ENCOUNTER — Encounter: Payer: Self-pay | Admitting: Cardiology

## 2024-03-07 ENCOUNTER — Ambulatory Visit: Attending: Cardiology | Admitting: Cardiology

## 2024-03-07 VITALS — BP 144/72 | HR 80 | Ht 64.0 in | Wt 157.0 lb

## 2024-03-07 DIAGNOSIS — I951 Orthostatic hypotension: Secondary | ICD-10-CM

## 2024-03-07 DIAGNOSIS — I10 Essential (primary) hypertension: Secondary | ICD-10-CM

## 2024-03-07 NOTE — Progress Notes (Signed)
 Clinical Summary [name] is a 78 y.o.female seen today for follow up of the following medical problems.   Prevoiusly followed by [facility]     1.Orthostatic hypotension/Systemic HTN - several year history - history of syncope in the past thought secondary to orthostasis - previous seen by [facility], notes are in epic but limited without providing complete history   - norvasc  lowered previously by [facility] and later stopped, losartan /hydrochlorothiazide  was lowered to half dose however she reports still taking the full tablet.    BP from 200s systolic to 120s from sitting to standing during 11/2023 ER visit    -AM cortisol 9.1  - has increased hydration since last visit, dizziness has improved. We had also stopped her hydrochlorothiazide  since last visit.       2. Chronic LBBB - 2011 cath normal coronaries     3. SOB - typically comes with activity - no coughing, no wheezing. No smoking history - benign echo and stress test as reported below Past Medical History:  Diagnosis Date   Arthritis    Current use of estrogen therapy 03/25/2013   Essential hypertension    GERD (gastroesophageal reflux disease)    H/O cardiac catheterization    2011: normal coronary arteries (false positive stress test).   Hyperlipidemia    Hypothyroidism    LBBB (left bundle [name] block)    Nonischemic cardiomyopathy (HCC)    Orthostatic hypotension    associated with syncope   TIA (transient ischemic attack)    On Plavix      Allergies  Allergen Reactions   Codeine Nausea And Vomiting   Lisinopril  Cough   Other Nausea And Vomiting    Most anesthesia      Current Outpatient Medications  Medication Sig Dispense Refill   acetaminophen  (TYLENOL ) 500 MG tablet Take 1,000 mg by mouth every 6 (six) hours as needed.     Calcium  Carb-Cholecalciferol (CALCIUM  1000 + D PO) Take by mouth. (Patient not taking: Reported on 02/11/2024)     clopidogrel   (PLAVIX ) 75 MG tablet Take 1 tablet (75 mg total) by mouth daily. (Patient not taking: Reported on 02/11/2024) 30 tablet 12   doxycycline  (VIBRAMYCIN ) 100 MG capsule Take 1 capsule (100 mg total) by mouth 2 (two) times daily. (Patient not taking: Reported on 02/11/2024) 20 capsule 0   Levothyroxine  Sodium 88 MCG CAPS Take 75 mcg by mouth daily before breakfast.      losartan  (COZAAR ) 50 MG tablet Take 1 tablet (50 mg total) by mouth daily. 30 tablet 6   Multiple Vitamins-Minerals (CENTRUM ADULTS PO) Take 1 tablet by mouth daily.     pravastatin  (PRAVACHOL ) 20 MG tablet Take 1 tablet (20 mg total) by mouth daily. 30 tablet 12   No current facility-administered medications for this visit.     Past Surgical History:  Procedure Laterality Date   ABDOMINAL HYSTERECTOMY     COLONOSCOPY  12/25/2010   Procedure: COLONOSCOPY;  Surgeon: Laura RAYMOND Rivet, MD;  Location: [facility];  Service: Endoscopy;  Laterality: N/A;   NECK SURGERY     Secondary to ruptured disc   PARTIAL HYSTERECTOMY     POSTERIOR CERVICAL FUSION/FORAMINOTOMY  12/18/2011   Procedure: POSTERIOR CERVICAL FUSION/FORAMINOTOMY LEVEL 2;  Surgeon: Laura LELON Peaches, MD;  Location: [facility];  Service: Neurosurgery;  Laterality: Bilateral;  Cervical four to six Posterior cervical arthrodesis   WRIST FUSION     Right- torn ligament     Allergies  Allergen Reactions   Codeine Nausea And Vomiting   Lisinopril  Cough   Other Nausea And Vomiting    Most anesthesia       Family History  Problem Relation Age of Onset   Colon cancer Mother    Lung cancer Mother    Diabetes Father    Cancer Brother        Back     Social History [name] reports that she has never smoked. She has never used smokeless tobacco. [name] reports no history of alcohol use.   Physical Examination Today's Vitals   03/07/24 1417  BP: (!) 144/72  Pulse: 80  SpO2: 97%  Weight: 157 lb (71.2 kg)  Height: 5' 4 (1.626 m)   Body mass  index is 26.95 kg/m.  Gen: resting comfortably, no acute distress HEENT: no scleral icterus, pupils equal round and reactive, no palptable cervical adenopathy,  CV: RRR, no m/rg, no jvd Resp: Clear to auscultation bilaterally GI: abdomen is soft, non-tender, non-distended, normal bowel sounds, no hepatosplenomegaly MSK: extremities are warm, no edema.  Skin: warm, no rash Neuro:  no focal deficits Psych: appropriate affect   Diagnostic Studies  [facility] studies 08/2023 echo ASSESSMENT  Technically adequate study.  Normal chamber sizes.  Normal left ventricular systolic function.  Mild left ventricular hypertrophy with GRADE 1  (relaxation abnormality)  diastolic dysfunction.  Normal right ventricular systolic function.  Normal right ventricular diastolic function.  Normal left ventricular wall motion.  Normal right ventricular wall motion.  Mild/Moderate tricuspid regurgitation.  Moderate pulmonary hypertension.  Mild mitral regurgitation.  No pericardial effusion.    08/2023 nuclear stress: no ischemia   08/2023 carotid US  Minimal plaque     Assessment and Plan   1.Orthostatic hypotension/Systemic hypertension - long history of up and down bp's, intermittent orthostatic syncope - since last visit dizziness improved with increased hydration, stopping hydrochlorothiazide . BP's reasonable, avoid aggressive regimen given orthostatic history - continue compression stockings - if recurrent symptoms consider abdominal binder. If complex consider referral to HTN clinic    F/u 6 months     [name], M.D.

## 2024-03-07 NOTE — Patient Instructions (Signed)

## 2024-03-15 ENCOUNTER — Ambulatory Visit (HOSPITAL_COMMUNITY)
Admission: RE | Admit: 2024-03-15 | Discharge: 2024-03-15 | Disposition: A | Discharge status: Home / Self Care | Source: Ambulatory Visit | Attending: Nephrology | Admitting: Nephrology

## 2024-03-15 ENCOUNTER — Ambulatory Visit: Admitting: Cardiology

## 2024-03-15 DIAGNOSIS — R809 Proteinuria, unspecified: Secondary | ICD-10-CM | POA: Diagnosis not present

## 2024-03-15 DIAGNOSIS — N1832 Chronic kidney disease, stage 3b: Secondary | ICD-10-CM | POA: Diagnosis not present

## 2024-03-15 DIAGNOSIS — Z0389 Encounter for observation for other suspected diseases and conditions ruled out: Secondary | ICD-10-CM | POA: Diagnosis not present

## 2024-04-28 ENCOUNTER — Encounter: Payer: Self-pay | Admitting: *Deleted

## 2024-04-28 NOTE — Progress Notes (Signed)
 [name]                                          MRN: [id number]   04/28/2024   The [facility] Quality Team Specialist reviewed this patient medical record for the purposes of chart review for care gap closure. The following were reviewed: chart review for care gap closure-controlling blood pressure.    [facility] Quality Team
